# Patient Record
Sex: Female | Born: 1955 | Race: White | Hispanic: No | Marital: Married | Smoking: Never smoker
Health system: Southern US, Community
[De-identification: ages and names within clinical notes are randomized; demographics above are authoritative.]

## PROBLEM LIST (undated history)

## (undated) DIAGNOSIS — E785 Hyperlipidemia, unspecified: Secondary | ICD-10-CM

## (undated) DIAGNOSIS — N951 Menopausal and female climacteric states: Secondary | ICD-10-CM

## (undated) DIAGNOSIS — K219 Gastro-esophageal reflux disease without esophagitis: Secondary | ICD-10-CM

## (undated) DIAGNOSIS — R0600 Dyspnea, unspecified: Secondary | ICD-10-CM

## (undated) DIAGNOSIS — M810 Age-related osteoporosis without current pathological fracture: Secondary | ICD-10-CM

## (undated) DIAGNOSIS — Z9889 Other specified postprocedural states: Secondary | ICD-10-CM

## (undated) DIAGNOSIS — T8859XA Other complications of anesthesia, initial encounter: Secondary | ICD-10-CM

## (undated) DIAGNOSIS — I10 Essential (primary) hypertension: Secondary | ICD-10-CM

## (undated) DIAGNOSIS — H269 Unspecified cataract: Secondary | ICD-10-CM

## (undated) DIAGNOSIS — D860 Sarcoidosis of lung: Secondary | ICD-10-CM

## (undated) DIAGNOSIS — C439 Malignant melanoma of skin, unspecified: Secondary | ICD-10-CM

## (undated) DIAGNOSIS — C801 Malignant (primary) neoplasm, unspecified: Secondary | ICD-10-CM

## (undated) DIAGNOSIS — T4145XA Adverse effect of unspecified anesthetic, initial encounter: Secondary | ICD-10-CM

## (undated) DIAGNOSIS — R112 Nausea with vomiting, unspecified: Secondary | ICD-10-CM

## (undated) DIAGNOSIS — J4540 Moderate persistent asthma, uncomplicated: Secondary | ICD-10-CM

## (undated) HISTORY — DX: Sarcoidosis of lung: D86.0

## (undated) HISTORY — DX: Menopausal and female climacteric states: N95.1

## (undated) HISTORY — DX: Essential (primary) hypertension: I10

## (undated) HISTORY — DX: Unspecified cataract: H26.9

## (undated) HISTORY — PX: BREAST CYST ASPIRATION: SHX578

## (undated) HISTORY — DX: Moderate persistent asthma, uncomplicated: J45.40

## (undated) HISTORY — DX: Hyperlipidemia, unspecified: E78.5

## (undated) HISTORY — DX: Age-related osteoporosis without current pathological fracture: M81.0

---
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
## 1999-08-16 ENCOUNTER — Other Ambulatory Visit: Admission: RE | Admit: 1999-08-16 | Discharge: 1999-08-16 | Payer: Self-pay | Admitting: Obstetrics and Gynecology

## 1999-08-17 ENCOUNTER — Other Ambulatory Visit: Admission: RE | Admit: 1999-08-17 | Discharge: 1999-08-17 | Payer: Self-pay | Admitting: Obstetrics and Gynecology

## 2001-01-25 ENCOUNTER — Other Ambulatory Visit: Admission: RE | Admit: 2001-01-25 | Discharge: 2001-01-25 | Payer: Self-pay | Admitting: Obstetrics and Gynecology

## 2006-07-26 ENCOUNTER — Other Ambulatory Visit: Admission: RE | Admit: 2006-07-26 | Discharge: 2006-07-26 | Payer: Self-pay | Admitting: Internal Medicine

## 2007-03-08 ENCOUNTER — Encounter: Admission: RE | Admit: 2007-03-08 | Discharge: 2007-03-08 | Payer: Self-pay | Admitting: Internal Medicine

## 2007-08-13 ENCOUNTER — Other Ambulatory Visit: Admission: RE | Admit: 2007-08-13 | Discharge: 2007-08-13 | Payer: Self-pay | Admitting: Internal Medicine

## 2008-03-09 ENCOUNTER — Encounter: Admission: RE | Admit: 2008-03-09 | Discharge: 2008-03-09 | Payer: Self-pay | Admitting: Internal Medicine

## 2008-03-31 ENCOUNTER — Ambulatory Visit: Payer: Self-pay | Admitting: Internal Medicine

## 2008-09-10 ENCOUNTER — Ambulatory Visit: Payer: Self-pay | Admitting: Internal Medicine

## 2008-09-10 ENCOUNTER — Other Ambulatory Visit: Admission: RE | Admit: 2008-09-10 | Discharge: 2008-09-10 | Payer: Self-pay | Admitting: Internal Medicine

## 2009-03-10 ENCOUNTER — Encounter: Admission: RE | Admit: 2009-03-10 | Discharge: 2009-03-10 | Payer: Self-pay | Admitting: Internal Medicine

## 2009-03-18 ENCOUNTER — Ambulatory Visit: Payer: Self-pay | Admitting: Internal Medicine

## 2009-09-14 ENCOUNTER — Ambulatory Visit: Payer: Self-pay | Admitting: Internal Medicine

## 2010-03-10 ENCOUNTER — Ambulatory Visit: Payer: Self-pay | Admitting: Internal Medicine

## 2010-03-11 ENCOUNTER — Encounter: Admission: RE | Admit: 2010-03-11 | Discharge: 2010-03-11 | Payer: Self-pay | Admitting: Internal Medicine

## 2010-03-25 ENCOUNTER — Encounter: Admission: RE | Admit: 2010-03-25 | Discharge: 2010-03-25 | Payer: Self-pay | Admitting: Internal Medicine

## 2010-08-15 ENCOUNTER — Other Ambulatory Visit: Payer: Self-pay | Admitting: Internal Medicine

## 2010-08-15 DIAGNOSIS — Z09 Encounter for follow-up examination after completed treatment for conditions other than malignant neoplasm: Secondary | ICD-10-CM

## 2010-08-30 ENCOUNTER — Ambulatory Visit
Admission: RE | Admit: 2010-08-30 | Discharge: 2010-08-30 | Disposition: A | Discharge status: Home / Self Care | Payer: PRIVATE HEALTH INSURANCE | Source: Ambulatory Visit | Attending: Internal Medicine | Admitting: Internal Medicine

## 2010-08-30 ENCOUNTER — Other Ambulatory Visit: Payer: Self-pay | Admitting: Internal Medicine

## 2010-08-30 DIAGNOSIS — Z09 Encounter for follow-up examination after completed treatment for conditions other than malignant neoplasm: Secondary | ICD-10-CM

## 2010-08-30 DIAGNOSIS — N6001 Solitary cyst of right breast: Secondary | ICD-10-CM

## 2010-09-15 ENCOUNTER — Other Ambulatory Visit: Payer: Self-pay | Admitting: Internal Medicine

## 2010-09-22 ENCOUNTER — Encounter: Payer: Self-pay | Admitting: Internal Medicine

## 2010-09-22 ENCOUNTER — Ambulatory Visit (INDEPENDENT_AMBULATORY_CARE_PROVIDER_SITE_OTHER): Payer: PRIVATE HEALTH INSURANCE | Admitting: Internal Medicine

## 2010-09-22 VITALS — BP 126/88 | HR 76 | Temp 98.2°F | Ht 64.0 in | Wt 143.0 lb

## 2010-09-22 DIAGNOSIS — Z0000 Encounter for general adult medical examination without abnormal findings: Secondary | ICD-10-CM

## 2010-09-22 DIAGNOSIS — I10 Essential (primary) hypertension: Secondary | ICD-10-CM | POA: Insufficient documentation

## 2010-09-22 LAB — POCT URINALYSIS DIPSTICK
Bilirubin, UA: NEGATIVE
Leukocytes, UA: NEGATIVE
Nitrite, UA: NEGATIVE
Protein, UA: NEGATIVE
pH, UA: 7

## 2010-09-22 LAB — CBC WITH DIFFERENTIAL/PLATELET
Basophils Absolute: 0 10*3/uL (ref 0.0–0.1)
Basophils Relative: 0 % (ref 0–1)
MCHC: 33.5 g/dL (ref 30.0–36.0)
Neutro Abs: 2.7 10*3/uL (ref 1.7–7.7)
Neutrophils Relative %: 66 % (ref 43–77)
RDW: 12.6 % (ref 11.5–15.5)
WBC: 4.1 10*3/uL (ref 4.0–10.5)

## 2010-09-22 LAB — COMPREHENSIVE METABOLIC PANEL
ALT: 13 U/L (ref 0–35)
AST: 22 U/L (ref 0–37)
Albumin: 4.6 g/dL (ref 3.5–5.2)
Alkaline Phosphatase: 55 U/L (ref 39–117)
Potassium: 3.8 mEq/L (ref 3.5–5.3)
Sodium: 141 mEq/L (ref 135–145)
Total Protein: 6.7 g/dL (ref 6.0–8.3)

## 2010-09-22 LAB — LIPID PANEL
HDL: 75 mg/dL (ref >39)
LDL Cholesterol: 129 mg/dL — ABNORMAL HIGH (ref 0–99)

## 2010-09-22 LAB — TSH: TSH: 0.664 u[IU]/mL (ref 0.350–4.500)

## 2010-09-22 LAB — VITAMIN D 25 HYDROXY (VIT D DEFICIENCY, FRACTURES): Vit D, 25-Hydroxy: 40 ng/mL (ref 30–89)

## 2010-09-22 NOTE — Patient Instructions (Signed)
Continue same meds     Return in 6 months

## 2010-09-22 NOTE — Progress Notes (Signed)
  Subjective:    Patient ID: Tamara Myers, female    DOB: 08-Mar-1956, 55 y.o.   MRN: [id number]  HPI 55 year old white female for physical examination and evaluation of hypertension controlled. No complaints or problems except for recent URI symptoms onset yesterday. No fever or chills. No productive sputum or sore throat.    Review of Systems  Constitutional: Negative for fever, activity change, appetite change, fatigue and unexpected weight change.  HENT: Positive for congestion.   Eyes: Negative.   Respiratory: Negative.   Cardiovascular: Negative.   Gastrointestinal: Negative.   Genitourinary: Negative.   Musculoskeletal: Negative.   Neurological: Negative.   Hematological: Negative.   Psychiatric/Behavioral: Negative.        Objective:   Physical Exam  Constitutional: She is oriented to person, place, and time. She appears well-developed and well-nourished.  HENT:  Head: Normocephalic and atraumatic.  Right Ear: External ear normal.  Left Ear: External ear normal.  Nose: Nose normal.  Mouth/Throat: No oropharyngeal exudate.  Eyes: Conjunctivae and EOM are normal. Pupils are equal, round, and reactive to light. Right eye exhibits no discharge. Left eye exhibits no discharge. No scleral icterus.  Neck: Normal range of motion. Neck supple. No JVD present. No thyromegaly present.  Cardiovascular: Normal rate, regular rhythm, normal heart sounds and intact distal pulses.   No murmur heard. Pulmonary/Chest: Effort normal and breath sounds normal. No respiratory distress. She has no wheezes. She has no rales.  Abdominal: Soft. Bowel sounds are normal. She exhibits no mass. There is no tenderness. There is no rebound.  Musculoskeletal: Normal range of motion. She exhibits no edema.  Lymphadenopathy:    She has no cervical adenopathy.  Neurological: She is alert and oriented to person, place, and time. She has normal reflexes. She displays normal reflexes. No cranial nerve  deficit.  Skin: Skin is warm and dry. No rash noted.          Assessment & Plan:    New onset upper respiratory infection. Symptomatic treatment only. A prescription given.  Hypertension well controlled on Norvasc 5 mg daily and Maxzide 25 one tablet daily.  Return in 6 months for office visit and blood pressure check.

## 2010-09-23 ENCOUNTER — Encounter: Payer: Self-pay | Admitting: Internal Medicine

## 2010-12-01 ENCOUNTER — Other Ambulatory Visit: Payer: Self-pay | Admitting: Internal Medicine

## 2011-01-24 ENCOUNTER — Other Ambulatory Visit: Payer: Self-pay | Admitting: Internal Medicine

## 2011-01-24 DIAGNOSIS — Z1231 Encounter for screening mammogram for malignant neoplasm of breast: Secondary | ICD-10-CM

## 2011-03-30 ENCOUNTER — Ambulatory Visit (INDEPENDENT_AMBULATORY_CARE_PROVIDER_SITE_OTHER): Payer: PRIVATE HEALTH INSURANCE | Admitting: Internal Medicine

## 2011-03-30 ENCOUNTER — Encounter: Payer: Self-pay | Admitting: Internal Medicine

## 2011-03-30 VITALS — BP 126/84 | HR 76 | Temp 98.2°F | Wt 144.0 lb

## 2011-03-30 DIAGNOSIS — J069 Acute upper respiratory infection, unspecified: Secondary | ICD-10-CM

## 2011-03-30 DIAGNOSIS — I10 Essential (primary) hypertension: Secondary | ICD-10-CM

## 2011-03-30 NOTE — Progress Notes (Signed)
  Subjective:    Patient ID: Tamara Myers, female    DOB: 1955-07-11, 55 y.o.   MRN: [id number]  HPI 55 year old white female in today for six-month recheck on hypertension. Blood pressure under good control with Maxzide 25 and amlodipine 5 mg daily. She did receive an influenza immunization through her employment. Also has developed URI the past few days. No fever or chills or myalgias. Has had nasal congestion and some cough at night.    Review of Systems     Objective:   Physical Exam HEENT exam: TMs and pharynx are clear; neck is supple without adenopathy; chest clear; cardiac exam regular rate and rhythm; extremities without edema        Assessment & Plan:  Hypertension-well-controlled on current regimen  URI  Plan: Continue same medications for hypertension. Return in 6 months for physical exam and fasting lab work. For cough Tessalon Perles 100 mg (#60) 2 by mouth 3 times a day when necessary cough.

## 2011-03-30 NOTE — Patient Instructions (Signed)
Continue same medications for hypertension control. Return in 6 months for physical examination. For cough May take Tessalon Perles 2 by mouth 3 times daily

## 2011-04-05 ENCOUNTER — Ambulatory Visit
Admission: RE | Admit: 2011-04-05 | Discharge: 2011-04-05 | Disposition: A | Discharge status: Home / Self Care | Payer: PRIVATE HEALTH INSURANCE | Source: Ambulatory Visit | Attending: Internal Medicine | Admitting: Internal Medicine

## 2011-04-05 DIAGNOSIS — Z1231 Encounter for screening mammogram for malignant neoplasm of breast: Secondary | ICD-10-CM

## 2011-07-29 ENCOUNTER — Ambulatory Visit (INDEPENDENT_AMBULATORY_CARE_PROVIDER_SITE_OTHER): Payer: PRIVATE HEALTH INSURANCE | Admitting: Physician Assistant

## 2011-07-29 VITALS — BP 105/74 | HR 97 | Temp 101.2°F | Resp 18 | Ht 64.25 in | Wt 141.0 lb

## 2011-07-29 DIAGNOSIS — J40 Bronchitis, not specified as acute or chronic: Secondary | ICD-10-CM

## 2011-07-29 DIAGNOSIS — R05 Cough: Secondary | ICD-10-CM

## 2011-07-29 DIAGNOSIS — R509 Fever, unspecified: Secondary | ICD-10-CM

## 2011-07-29 LAB — POCT CBC
HCT, POC: 39.6 % (ref 37.7–47.9)
Hemoglobin: 13.1 g/dL (ref 12.2–16.2)
Lymph, poc: 1.5 (ref 0.6–3.4)
MCHC: 33.1 g/dL (ref 31.8–35.4)
POC Granulocyte: 6.8 (ref 2–6.9)

## 2011-07-29 LAB — POCT INFLUENZA A/B
Influenza A, POC: NEGATIVE
Influenza B, POC: NEGATIVE

## 2011-07-29 MED ORDER — AZITHROMYCIN 500 MG PO TABS: 500.0000 mg | ORAL_TABLET | Freq: Every day | ORAL | Status: AC

## 2011-07-29 MED ORDER — HYDROCODONE-HOMATROPINE 5-1.5 MG/5ML PO SYRP: ORAL_SOLUTION | ORAL | Status: AC

## 2011-07-29 NOTE — Progress Notes (Signed)
Patient ID: Tamara Myers MRN: [id number], DOB: 1955/07/02, 56 y.o. Date of Encounter: 07/29/2011, 11:38 AM  Primary Physician: No primary provider on file.  Chief Complaint:  Chief Complaint  Patient presents with  . Cough    x 1 week, productive-greenish yellow  causes chest pain  . URI  . Sinusitis    HPI: 56 y.o. year old female presents with a 7 day history of nasal congestion, post nasal drip, sinus pressure, and cough. Tmax currently. Mild chills. Nasal congestion thick and green/yellow. Cough is productive of green/yellow sputum and worse at night time. Will get into cough episodes that cause soreness. No chest pain, SOB, or wheezing. Ears feel full, leading to sensation of muffled hearing. Has tried OTC cold preps without success. No GI complaints. Appetite normal. Husband sick with same symptoms. She did receive an influenza vaccine this year.  No recent antibiotics or recent travels.   No leg trauma, sedentary periods, h/o cancer, or tobacco use.  Past Medical History  Diagnosis Date  . Hypertension   . Post menopausal syndrome      Home Meds: Prior to Admission medications   Medication Sig Start Date End Date Taking? Authorizing Provider  amLODipine (NORVASC) 5 MG tablet TAKE 1 TABLET BY MOUTH EVERY DAY 09/15/10  Yes Margaree Mackintosh, MD  triamterene-hydrochlorothiazide (MAXZIDE-25) 37.5-25 MG per tablet TAKE 1 TABLET BY MOUTH EVERY DAY 12/01/10  Yes Margaree Mackintosh, MD  glucosamine-chondroitin 500-400 MG tablet Take 1 tablet by mouth 2 (two) times daily at 10 AM and 5 PM.      Historical Provider, MD  vitamin E 400 UNIT capsule Take 400 Units by mouth 2 (two) times daily.      Historical Provider, MD    Allergies: No Known Allergies  History   Social History  . Marital Status: Married    Spouse Name: N/A    Number of Children: N/A  . Years of Education: N/A   Occupational History  . Not on file.   Social History Main Topics  . Smoking status: Never Smoker     . Smokeless tobacco: Never Used  . Alcohol Use: No  . Drug Use: No  . Sexually Active: Not on file   Other Topics Concern  . Not on file   Social History Narrative  . No narrative on file     Review of Systems: Constitutional: negative for chills, fever, night sweats or weight changes Cardiovascular: negative for chest pain or palpitations Respiratory: negative for hemoptysis, wheezing, or shortness of breath Abdominal: negative for abdominal pain, nausea, vomiting or diarrhea Dermatological: negative for rash Neurologic: negative for headache   Physical Exam: Blood pressure 105/74, pulse 97, temperature 101.2 F (38.4 C), temperature source Oral, resp. rate 18, height 5' 4.25" (1.632 m), weight 141 lb (63.957 kg), SpO2 95.00%., Body mass index is 24.01 kg/(m^2). General: Well developed, well nourished, in no acute distress. Head: Normocephalic, atraumatic, eyes without discharge, sclera non-icteric, nares are congested. Bilateral auditory canals clear, TM's are without perforation, pearly grey with reflective cone of light bilaterally. Serous effusion bilaterally behind TM's. Maxillary sinus TTP. Oral cavity moist, dentition normal. Posterior pharynx with post nasal drip and mild erythema. No peritonsillar abscess or tonsillar exudate. Neck: Supple. No thyromegaly. Full ROM. No lymphadenopathy. Lungs: Coarse breath sounds bilaterally without wheezes, rales, or rhonchi. Breathing is unlabored.  Heart: RRR with S1 S2. No murmurs, rubs, or gallops appreciated. Msk:  Strength and tone normal for age. Extremities: No  clubbing or cyanosis. No edema. Neuro: Alert and oriented X 3. Moves all extremities spontaneously. CNII-XII grossly in tact. Psych:  Responds to questions appropriately with a normal affect.   Labs: Results for orders placed in visit on 07/29/11  POCT CBC      Component Value Range   WBC 8.7  4.6 - 10.2 (K/uL)   Lymph, poc 1.5  0.6 - 3.4    POC LYMPH PERCENT 17.0   10 - 50 (%L)   MID (cbc) 0.4  0 - 0.9    POC MID % 4.9  0 - 12 (%M)   POC Granulocyte 6.8  2 - 6.9    Granulocyte percent 78.1  37 - 80 (%G)   RBC 4.50  4.04 - 5.48 (M/uL)   Hemoglobin 13.1  12.2 - 16.2 (g/dL)   HCT, POC 16.1  09.6 - 47.9 (%)   MCV 88.1  80 - 97 (fL)   MCH, POC 29.1  27 - 31.2 (pg)   MCHC 33.1  31.8 - 35.4 (g/dL)   RDW, POC 04.5     Platelet Count, POC 251  142 - 424 (K/uL)   MPV 8.2  0 - 99.8 (fL)  POCT INFLUENZA A/B      Component Value Range   Influenza A, POC Negative     Influenza B, POC Negative       ASSESSMENT AND PLAN:  56 y.o. year old female with bronchitis -Azithromycin 500 mg 1 po daily #5 no RF -Hycodan #4oz 1 tsp po q 4-6 hours prn cough no RF SED -Mucinex -Tylenol/Motrin prn -Rest/fluids -RTC precautions -RTC 3-5 days if no improvement  Signed, Eula Listen, PA-C 07/29/2011 11:38 AM

## 2011-08-27 ENCOUNTER — Other Ambulatory Visit: Payer: Self-pay | Admitting: Internal Medicine

## 2011-09-29 ENCOUNTER — Other Ambulatory Visit (HOSPITAL_COMMUNITY)
Admission: RE | Admit: 2011-09-29 | Discharge: 2011-09-29 | Disposition: A | Discharge status: Home / Self Care | Payer: PRIVATE HEALTH INSURANCE | Source: Ambulatory Visit | Attending: Internal Medicine | Admitting: Internal Medicine

## 2011-09-29 ENCOUNTER — Encounter: Payer: Self-pay | Admitting: Internal Medicine

## 2011-09-29 ENCOUNTER — Ambulatory Visit (INDEPENDENT_AMBULATORY_CARE_PROVIDER_SITE_OTHER): Payer: PRIVATE HEALTH INSURANCE | Admitting: Internal Medicine

## 2011-09-29 VITALS — BP 122/78 | HR 76 | Temp 98.9°F | Ht 64.0 in | Wt 140.0 lb

## 2011-09-29 DIAGNOSIS — Z124 Encounter for screening for malignant neoplasm of cervix: Secondary | ICD-10-CM

## 2011-09-29 DIAGNOSIS — Z01419 Encounter for gynecological examination (general) (routine) without abnormal findings: Secondary | ICD-10-CM | POA: Insufficient documentation

## 2011-09-29 DIAGNOSIS — Z0000 Encounter for general adult medical examination without abnormal findings: Secondary | ICD-10-CM

## 2011-09-29 DIAGNOSIS — I10 Essential (primary) hypertension: Secondary | ICD-10-CM

## 2011-09-29 DIAGNOSIS — N6019 Diffuse cystic mastopathy of unspecified breast: Secondary | ICD-10-CM

## 2011-09-29 LAB — POCT URINALYSIS DIPSTICK
Glucose, UA: NEGATIVE
Nitrite, UA: NEGATIVE
Spec Grav, UA: 1.01
Urobilinogen, UA: NEGATIVE

## 2011-09-30 LAB — LIPID PANEL
Cholesterol: 233 mg/dL — ABNORMAL HIGH (ref 0–200)
VLDL: 15 mg/dL (ref 0–40)

## 2011-09-30 LAB — CBC WITH DIFFERENTIAL/PLATELET
Lymphocytes Relative: 32 % (ref 12–46)
Lymphs Abs: 0.8 10*3/uL (ref 0.7–4.0)
Neutro Abs: 1.5 10*3/uL — ABNORMAL LOW (ref 1.7–7.7)
Neutrophils Relative %: 57 % (ref 43–77)
Platelets: 279 10*3/uL (ref 150–400)
RBC: 4.46 MIL/uL (ref 3.87–5.11)
WBC: 2.6 10*3/uL — ABNORMAL LOW (ref 4.0–10.5)

## 2011-09-30 LAB — COMPREHENSIVE METABOLIC PANEL
ALT: 12 U/L (ref 0–35)
CO2: 27 mEq/L (ref 19–32)
Calcium: 9.7 mg/dL (ref 8.4–10.5)
Chloride: 102 mEq/L (ref 96–112)
Sodium: 139 mEq/L (ref 135–145)
Total Protein: 6.7 g/dL (ref 6.0–8.3)

## 2011-09-30 LAB — TSH: TSH: 0.825 u[IU]/mL (ref 0.350–4.500)

## 2011-09-30 LAB — VITAMIN D 25 HYDROXY (VIT D DEFICIENCY, FRACTURES): Vit D, 25-Hydroxy: 41 ng/mL (ref 30–89)

## 2011-10-29 ENCOUNTER — Encounter: Payer: Self-pay | Admitting: Internal Medicine

## 2011-10-29 DIAGNOSIS — N6019 Diffuse cystic mastopathy of unspecified breast: Secondary | ICD-10-CM | POA: Insufficient documentation

## 2011-10-29 NOTE — Patient Instructions (Addendum)
Continue same antihypertensive medications. Return in 6 months. Consider colonoscopy.

## 2011-10-29 NOTE — Progress Notes (Signed)
Subjective:    Patient ID: Tamara Myers, female    DOB: 1956/02/15, 56 y.o.   MRN: [id number]  HPI 56 year old white female in today for physical examination in today wishing medical problems. History of hypertension well controlled on Norvasc 5 mg daily and Maxzide 25 daily.  No known drug allergies  Every past medical history: 2 C-sections in the past. Fractured right fifth metacarpal and HTN. Fractured left fifth finger at age 72.  Social history: Married with 2 adult children. Works for [employer] Family history: Father with history of CABG, diabetes mellitus, BOOP syndrome, hypertension, hyperlipidemia. Mother with history of Mnire's disease and history of melanoma.  Patient became menopausal in 2004. Had mammogram may 2012. Tetanus immunization given November 2010, influenza immunization October 2012.  Does not smoke or consume alcohol.  History of right knee pain seen by orthopedist 2011 at which time she received an injection of Xylocaine and Aristospan into the right knee.  History of fibrocystic disease and had aspiration of right breast cyst may 2012.    Review of Systems  Constitutional: Negative.   HENT: Negative.   Eyes: Negative.   Respiratory: Negative.   Cardiovascular: Negative.   Gastrointestinal: Negative.   Genitourinary: Negative.   Musculoskeletal: Negative.   Neurological: Negative.   Hematological: Negative.   Psychiatric/Behavioral: Negative.        Objective:   Physical Exam  Nursing note and vitals reviewed. Constitutional: She is oriented to person, place, and time. She appears well-developed and well-nourished. No distress.  HENT:  Head: Normocephalic and atraumatic.  Right Ear: External ear normal.  Left Ear: External ear normal.  Mouth/Throat: Oropharynx is clear and moist. No oropharyngeal exudate.  Eyes: Conjunctivae and EOM are normal. Pupils are equal, round, and reactive to light. Right eye exhibits no discharge. Left eye  exhibits no discharge. No scleral icterus.  Neck: Normal range of motion. Neck supple. No JVD present. No thyromegaly present.  Cardiovascular: Normal rate, regular rhythm, normal heart sounds and intact distal pulses.   No murmur heard. Pulmonary/Chest: Effort normal and breath sounds normal. She has no wheezes. She exhibits no tenderness.       Breasts normal female  Abdominal: Soft. Bowel sounds are normal. She exhibits no distension and no mass. There is no tenderness. There is no rebound and no guarding.  Genitourinary: Vagina normal and uterus normal. No vaginal discharge found.  Musculoskeletal: Normal range of motion. She exhibits no edema.  Lymphadenopathy:    She has no cervical adenopathy.  Neurological: She is alert and oriented to person, place, and time. She has normal reflexes. No cranial nerve deficit. Coordination normal.  Skin: Skin is warm and dry. No rash noted. She is not diaphoretic.  Psychiatric: She has a normal mood and affect. Her behavior is normal. Judgment and thought content normal.          Assessment & Plan:  Hypertension  Fibrocystic breast disease  Unexplained leukopenia-new onset  Plan: Recommend colonoscopy. In lieu of that given 3 Hemoccult cards. Needs annual mammogram. Return in 6 months.  Lab work shows white blood cell count low at 2600. Not sure why this is the case but can repeat in 6 months when she returns. Previously 2 months ago white blood cell count was 8700 and 1 year ago 4100. Hemoglobin and platelet count are normal. Total cholesterol was 233 and previously was 217 one year ago. Triglycerides are normal. She has a high HDL cholesterol of 83. LDL cholesterol is  high at 135. TSH is normal. Vitamin D is normal. Patient may need to consider statin therapy. Repeat lipid panel when she returns in 6 months.

## 2012-02-23 ENCOUNTER — Other Ambulatory Visit: Payer: Self-pay

## 2012-02-26 ENCOUNTER — Other Ambulatory Visit: Payer: Self-pay | Admitting: Internal Medicine

## 2012-02-27 ENCOUNTER — Other Ambulatory Visit: Payer: Self-pay | Admitting: Internal Medicine

## 2012-02-27 DIAGNOSIS — Z1231 Encounter for screening mammogram for malignant neoplasm of breast: Secondary | ICD-10-CM

## 2012-03-25 ENCOUNTER — Ambulatory Visit (INDEPENDENT_AMBULATORY_CARE_PROVIDER_SITE_OTHER): Payer: PRIVATE HEALTH INSURANCE | Admitting: Internal Medicine

## 2012-03-25 ENCOUNTER — Encounter: Payer: Self-pay | Admitting: Internal Medicine

## 2012-03-25 VITALS — BP 136/84 | HR 76 | Temp 98.2°F | Wt 148.0 lb

## 2012-03-25 DIAGNOSIS — H6590 Unspecified nonsuppurative otitis media, unspecified ear: Secondary | ICD-10-CM

## 2012-03-25 DIAGNOSIS — I10 Essential (primary) hypertension: Secondary | ICD-10-CM

## 2012-03-25 DIAGNOSIS — H6591 Unspecified nonsuppurative otitis media, right ear: Secondary | ICD-10-CM

## 2012-03-26 NOTE — Patient Instructions (Addendum)
Take Zithromax Z-PAK as to reck did for ear infection. Continue amlodipine and Maxide 25 for hypertension. Return in 6 months for physical exam.

## 2012-03-26 NOTE — Progress Notes (Signed)
  Subjective:    Patient ID: Tamara Myers, female    DOB: June 11, 1955, 56 y.o.   MRN: [id number]  HPI and white female in today for hypertension six-month recheck. Has had recent problem with right ear feeling full with some postnasal drip. Blood pressure is stable on amlodipine and Maxide 25. No fever or chills with year complaints. No sore throat. No cough.    Review of Systems     Objective:   Physical Exam Right TM is full but not red. Left TM is clear. Neck is supple without thyromegaly JVD or carotid bruits. Chest: clear to auscultation. Cardiac: exam regular rate and rhythm normal S1 and S2. Extremities: without edema. Skin is warm and dry.        Assessment & Plan:  Right serous otitis media  Hypertension  Plan: Continue same antihypertensive medications and return in 6 months for physical exam. Zithromax Z-PAK take 2 tablets day one followed by 1 tablet days 2 through 5 for serous otitis media.

## 2012-04-05 ENCOUNTER — Ambulatory Visit
Admission: RE | Admit: 2012-04-05 | Discharge: 2012-04-05 | Disposition: A | Discharge status: Home / Self Care | Payer: PRIVATE HEALTH INSURANCE | Source: Ambulatory Visit | Attending: Internal Medicine | Admitting: Internal Medicine

## 2012-04-05 DIAGNOSIS — Z1231 Encounter for screening mammogram for malignant neoplasm of breast: Secondary | ICD-10-CM

## 2012-04-11 ENCOUNTER — Telehealth: Payer: Self-pay | Admitting: Internal Medicine

## 2012-04-11 DIAGNOSIS — J069 Acute upper respiratory infection, unspecified: Secondary | ICD-10-CM

## 2012-04-11 MED ORDER — AZITHROMYCIN 250 MG PO TABS: ORAL_TABLET | ORAL | Status: DC

## 2012-04-11 NOTE — Telephone Encounter (Signed)
Call in Zithromax Z pak with no refill   2 po day 1 then one po days 2-5

## 2012-04-11 NOTE — Telephone Encounter (Signed)
[place] complains of fluid in right ear and would like to know if she could have an antibiotic for it. Zpak worked before she went to [employer].

## 2012-05-01 HISTORY — PX: OTHER SURGICAL HISTORY: SHX169

## 2012-08-26 ENCOUNTER — Other Ambulatory Visit: Payer: Self-pay | Admitting: Internal Medicine

## 2012-10-03 ENCOUNTER — Encounter: Payer: Self-pay | Admitting: Internal Medicine

## 2012-10-03 ENCOUNTER — Ambulatory Visit (INDEPENDENT_AMBULATORY_CARE_PROVIDER_SITE_OTHER): Payer: PRIVATE HEALTH INSURANCE | Admitting: Internal Medicine

## 2012-10-03 VITALS — BP 118/86 | HR 72 | Temp 97.9°F | Ht 64.0 in | Wt 145.0 lb

## 2012-10-03 DIAGNOSIS — Z13228 Encounter for screening for other metabolic disorders: Secondary | ICD-10-CM

## 2012-10-03 DIAGNOSIS — Z0000 Encounter for general adult medical examination without abnormal findings: Secondary | ICD-10-CM

## 2012-10-03 DIAGNOSIS — Z130 Encounter for screening for diseases of the blood and blood-forming organs and certain disorders involving the immune mechanism: Secondary | ICD-10-CM

## 2012-10-03 DIAGNOSIS — I10 Essential (primary) hypertension: Secondary | ICD-10-CM

## 2012-10-03 DIAGNOSIS — Z1329 Encounter for screening for other suspected endocrine disorder: Secondary | ICD-10-CM

## 2012-10-03 LAB — COMPREHENSIVE METABOLIC PANEL
ALT: 11 U/L (ref 0–35)
AST: 17 U/L (ref 0–37)
Alkaline Phosphatase: 58 U/L (ref 39–117)
BUN: 12 mg/dL (ref 6–23)
Creat: 0.78 mg/dL (ref 0.50–1.10)
Potassium: 3.6 mEq/L (ref 3.5–5.3)

## 2012-10-03 LAB — LIPID PANEL
HDL: 75 mg/dL (ref >39)
LDL Cholesterol: 152 mg/dL — ABNORMAL HIGH (ref 0–99)
Total CHOL/HDL Ratio: 3.2 Ratio
VLDL: 14 mg/dL (ref 0–40)

## 2012-10-03 LAB — CBC WITH DIFFERENTIAL/PLATELET
Basophils Absolute: 0 10*3/uL (ref 0.0–0.1)
Basophils Relative: 0 % (ref 0–1)
Eosinophils Relative: 2 % (ref 0–5)
HCT: 40.9 % (ref 36.0–46.0)
Lymphocytes Relative: 30 % (ref 12–46)
MCHC: 34.7 g/dL (ref 30.0–36.0)
Monocytes Absolute: 0.3 10*3/uL (ref 0.1–1.0)
Neutro Abs: 1.4 10*3/uL — ABNORMAL LOW (ref 1.7–7.7)
Platelets: 235 10*3/uL (ref 150–400)
RDW: 13.5 % (ref 11.5–15.5)
WBC: 2.5 10*3/uL — ABNORMAL LOW (ref 4.0–10.5)

## 2012-10-03 LAB — TSH: TSH: 0.566 u[IU]/mL (ref 0.350–4.500)

## 2012-10-03 NOTE — Patient Instructions (Addendum)
Continue same meds and return in 6 months 

## 2012-10-03 NOTE — Addendum Note (Signed)
Addended by: Judy Pimple on: 10/03/2012 12:24 PM   Modules accepted: Orders

## 2012-10-03 NOTE — Progress Notes (Signed)
  Subjective:    Patient ID: Tamara Myers, female    DOB: 1956/04/20, 57 y.o.   MRN: [id number]  HPI 57 year old white female in today for health maintenance and evaluation of medical problems. Has had several skin biopsies over the past year. Apparently had dysplastic nevus right leg treated with Dr. Irene Limbo, had atypical nevus removed from left arm and a basal cell carcinoma removed as well. History of sun damage in the remote past. No longer gets in the sun. She is fair skinned. History of hypertension. Blood pressure well controlled on Norvasc and Maxzide. History of fibrocystic breast disease.  No known drug allergies.  Past medical history: 2 C-sections and the past. Fractured right fifth metacarpal. Fractured left fifth finger at age 54.  Social history: Married with 2 adult children. Works for the [employer]  Family history: Father with history of CABG, diabetes mellitus, BOOP syndrome, hypertension, hyperlipidemia. Mother with history of Mnire's disease and history of melanoma.  Patient became menopausal in 2000. Had mammogram recently. Tetanus immunization November 2010.  Does not smoke or consume alcohol.  History of right knee pain seen by orthopedist 2011 at which time she received an injection of Xylocaine and Aristospan.  History of fibrocystic breast disease. Had aspiration of right breast cyst in May 2012    Review of Systems  Constitutional: Negative.   All other systems reviewed and are negative.       Objective:   Physical Exam  Vitals reviewed. Constitutional: She is oriented to person, place, and time. She appears well-developed and well-nourished. No distress.  HENT:  Head: Normocephalic and atraumatic.  Right Ear: External ear normal.  Left Ear: External ear normal.  Mouth/Throat: Oropharynx is clear and moist. No oropharyngeal exudate.  Eyes: Conjunctivae and EOM are normal. Pupils are equal, round, and reactive to light. Right eye exhibits  no discharge. Left eye exhibits no discharge. No scleral icterus.  Neck: Neck supple. No JVD present. No thyromegaly present.  Cardiovascular: Normal rate, regular rhythm, normal heart sounds and intact distal pulses.   No murmur heard. Pulmonary/Chest: Effort normal and breath sounds normal.  Breasts normal female  Abdominal: Soft. Bowel sounds are normal. She exhibits no distension and no mass. There is no tenderness. There is no rebound and no guarding.  Genitourinary:  Bimanual normal  Musculoskeletal: She exhibits no edema.  Lymphadenopathy:    She has no cervical adenopathy.  Neurological: She is alert and oriented to person, place, and time. She has normal reflexes. No cranial nerve deficit. Coordination normal.  Skin: Skin is warm and dry. No rash noted. She is not diaphoretic.  Psychiatric: She has a normal mood and affect. Her behavior is normal. Judgment and thought content normal.          Assessment & Plan:  Hypertension-well-controlled on current regimen  History of fibrocystic breast disease  History of basal cell carcinoma  History of dysplastic nevus/atypical melanocytic lesion  Plan: Request skin biopsy results from  [facility]  Dermatology. Return in 6 months for office visit blood pressure check. Continue same medications Norvasc and Maxzide. Okay to refill if pharmacy calls. Fasting labs drawn today and are pending.  Colonoscopy has been declined

## 2012-10-04 LAB — VITAMIN D 25 HYDROXY (VIT D DEFICIENCY, FRACTURES): Vit D, 25-Hydroxy: 34 ng/mL (ref 30–89)

## 2012-12-26 ENCOUNTER — Other Ambulatory Visit: Payer: Self-pay

## 2013-03-04 ENCOUNTER — Ambulatory Visit (INDEPENDENT_AMBULATORY_CARE_PROVIDER_SITE_OTHER): Payer: BC Managed Care – PPO | Admitting: Internal Medicine

## 2013-03-04 ENCOUNTER — Encounter: Payer: Self-pay | Admitting: Internal Medicine

## 2013-03-04 VITALS — BP 118/68 | HR 72 | Temp 98.5°F | Ht 64.0 in | Wt 138.0 lb

## 2013-03-04 DIAGNOSIS — H109 Unspecified conjunctivitis: Secondary | ICD-10-CM

## 2013-03-04 DIAGNOSIS — I10 Essential (primary) hypertension: Secondary | ICD-10-CM

## 2013-03-04 DIAGNOSIS — L0390 Cellulitis, unspecified: Secondary | ICD-10-CM

## 2013-03-04 DIAGNOSIS — R21 Rash and other nonspecific skin eruption: Secondary | ICD-10-CM

## 2013-03-04 DIAGNOSIS — L259 Unspecified contact dermatitis, unspecified cause: Secondary | ICD-10-CM

## 2013-03-04 DIAGNOSIS — L0291 Cutaneous abscess, unspecified: Secondary | ICD-10-CM

## 2013-03-04 MED ORDER — METHYLPREDNISOLONE ACETATE 80 MG/ML IJ SUSP
80.0000 mg | Freq: Once | INTRAMUSCULAR | Status: AC
  Administered 2013-03-04: 80 mg via INTRAMUSCULAR

## 2013-03-04 MED ORDER — OFLOXACIN 0.3 % OP SOLN: 1.0000 [drp] | Freq: Four times a day (QID) | OPHTHALMIC | Status: DC

## 2013-03-04 MED ORDER — CEPHALEXIN 500 MG PO CAPS: 500.0000 mg | ORAL_CAPSULE | Freq: Four times a day (QID) | ORAL | Status: DC

## 2013-03-04 NOTE — Patient Instructions (Signed)
You have been given injection of Depo-Medrol for inflammation. Take Keflex 500 mg 4 times daily for 7 days. Use of Floxin ophthalmic drops and right 4 times daily for 5-7 days. Cancel blood pressure check for December. See again in June 2015 for physical exam.

## 2013-03-12 ENCOUNTER — Other Ambulatory Visit: Payer: Self-pay

## 2013-03-12 DIAGNOSIS — Z1231 Encounter for screening mammogram for malignant neoplasm of breast: Secondary | ICD-10-CM

## 2013-03-16 ENCOUNTER — Encounter: Payer: Self-pay | Admitting: Internal Medicine

## 2013-03-16 NOTE — Progress Notes (Signed)
  Subjective:    Patient ID: Tamara Myers, female    DOB: 1956-02-15, 57 y.o.   MRN: [id number]  HPI  58 year old female in today regarding rash on right face. Doesn't recall any insect bite or injury. No new cosmetic products. No new medications. Rash is itchy. Just awakened with it a couple of days ago. She has fair skin and redheaded.  Seeing Dr. Orlan Leavens regarding injury right thumb. Recently diagnosed with right carpal tunnel syndrome. History of hypertension.    Review of Systems     Objective:   Physical Exam Patient has erythema right face with conjunctivitis       Assessment & Plan:  Contact dermatitis  Right conjunctivitis  Cellulitis right face  Hypertension  Plan: Patient will cancel blood pressure check previously scheduled for December so she is here today. Will be due for physical exam June 2015. Given Depo-Medrol 80 mg IM today. Take Keflex 500 mg 4 times daily for 7 days. Use ofloxacin ophthalmic drops in right eye 4 times a day for 5-7 days

## 2013-04-07 ENCOUNTER — Ambulatory Visit: Payer: PRIVATE HEALTH INSURANCE | Admitting: Internal Medicine

## 2013-04-11 ENCOUNTER — Ambulatory Visit
Admission: RE | Admit: 2013-04-11 | Discharge: 2013-04-11 | Disposition: A | Discharge status: Home / Self Care | Payer: BC Managed Care – PPO | Source: Ambulatory Visit

## 2013-04-11 DIAGNOSIS — Z1231 Encounter for screening mammogram for malignant neoplasm of breast: Secondary | ICD-10-CM

## 2013-05-19 ENCOUNTER — Other Ambulatory Visit: Payer: Self-pay | Admitting: Internal Medicine

## 2013-08-18 ENCOUNTER — Other Ambulatory Visit: Payer: Self-pay | Admitting: Internal Medicine

## 2013-09-18 ENCOUNTER — Encounter: Payer: Self-pay | Admitting: Internal Medicine

## 2013-09-18 ENCOUNTER — Ambulatory Visit (INDEPENDENT_AMBULATORY_CARE_PROVIDER_SITE_OTHER): Payer: BC Managed Care – PPO | Admitting: Internal Medicine

## 2013-09-18 VITALS — BP 122/78 | HR 64 | Temp 97.8°F | Ht 64.0 in | Wt 136.0 lb

## 2013-09-18 DIAGNOSIS — Z130 Encounter for screening for diseases of the blood and blood-forming organs and certain disorders involving the immune mechanism: Secondary | ICD-10-CM

## 2013-09-18 DIAGNOSIS — R8290 Unspecified abnormal findings in urine: Secondary | ICD-10-CM

## 2013-09-18 DIAGNOSIS — R82998 Other abnormal findings in urine: Secondary | ICD-10-CM

## 2013-09-18 DIAGNOSIS — Z1329 Encounter for screening for other suspected endocrine disorder: Secondary | ICD-10-CM

## 2013-09-18 DIAGNOSIS — Z0000 Encounter for general adult medical examination without abnormal findings: Secondary | ICD-10-CM

## 2013-09-18 DIAGNOSIS — Z13228 Encounter for screening for other metabolic disorders: Secondary | ICD-10-CM

## 2013-09-18 DIAGNOSIS — I10 Essential (primary) hypertension: Secondary | ICD-10-CM

## 2013-09-18 DIAGNOSIS — Z13220 Encounter for screening for lipoid disorders: Secondary | ICD-10-CM

## 2013-09-18 LAB — POCT URINALYSIS DIPSTICK
Bilirubin, UA: NEGATIVE
Blood, UA: NEGATIVE
Glucose, UA: NEGATIVE
Ketones, UA: NEGATIVE
Nitrite, UA: POSITIVE
PROTEIN UA: NEGATIVE
Spec Grav, UA: 1.015
Urobilinogen, UA: NEGATIVE
pH, UA: 7.5

## 2013-09-18 LAB — CBC WITH DIFFERENTIAL/PLATELET
Basophils Absolute: 0 10*3/uL (ref 0.0–0.1)
Basophils Relative: 0 % (ref 0–1)
Eosinophils Absolute: 0.1 10*3/uL (ref 0.0–0.7)
Eosinophils Relative: 3 % (ref 0–5)
HCT: 40.8 % (ref 36.0–46.0)
Hemoglobin: 14.2 g/dL (ref 12.0–15.0)
LYMPHS ABS: 0.5 10*3/uL — AB (ref 0.7–4.0)
Lymphocytes Relative: 17 % (ref 12–46)
MCH: 30.3 pg (ref 26.0–34.0)
MCHC: 34.8 g/dL (ref 30.0–36.0)
MCV: 87.2 fL (ref 78.0–100.0)
Monocytes Absolute: 0.3 10*3/uL (ref 0.1–1.0)
Monocytes Relative: 10 % (ref 3–12)
NEUTROS ABS: 1.9 10*3/uL (ref 1.7–7.7)
NEUTROS PCT: 70 % (ref 43–77)
PLATELETS: 247 10*3/uL (ref 150–400)
RBC: 4.68 MIL/uL (ref 3.87–5.11)
RDW: 13.3 % (ref 11.5–15.5)
WBC: 2.7 10*3/uL — ABNORMAL LOW (ref 4.0–10.5)

## 2013-09-18 NOTE — Patient Instructions (Signed)
Continue same medications. Return in one year or as needed. Continue to monitor blood pressure at home. Continue diet exercise regimen.

## 2013-09-18 NOTE — Progress Notes (Signed)
   Subjective:    Patient ID: Tamara Myers, female    DOB: 15-Oct-1955, 58 y.o.   MRN: [id number]  HPI 58 year old white female in today for health maintenance exam and evaluation of medical issues. History of hypertension well controlled on Norvasc 5 mg daily and Maxzide 25 mg daily.  No known drug allergies.  Past medical history: 2 C-sections in the past. Fractured right fifth metacarpal. Fractured left fifth finger at age 34.  Patient became menopausal in 2004. Tetanus immunization November 2010. History of fibrocystic breast disease. Had aspiration of right breast cyst in May 2012. History of right knee pain seen by orthopedist in 2011 at which time she received an injection of Xylocaine and Aristospan into the right knee.  Social history: Married with 2 adult children. Works for the town of [place]. Does not smoke or consume alcohol.  Family history: Father with history of CABG, diabetes mellitus, BOOP syndrome, hypertension, hyperlipidemia died of respiratory failure. Mother with history of Mnire's disease and melanoma living and doing well.  Patient is going with her husband to [place] in the fall. She may need to take some prophylactic antibiotics and Phenergan for nausea with her. She'll call for these prescriptions.  Review of Systems  Constitutional: Negative.   All other systems reviewed and are negative.      Objective:   Physical Exam  Vitals reviewed. Constitutional: She is oriented to person, place, and time. She appears well-developed and well-nourished. No distress.  HENT:  Head: Normocephalic and atraumatic.  Right Ear: External ear normal.  Left Ear: External ear normal.  Mouth/Throat: Oropharynx is clear and moist. No oropharyngeal exudate.  Eyes: Conjunctivae and EOM are normal. Pupils are equal, round, and reactive to light. Right eye exhibits no discharge. Left eye exhibits no discharge. No scleral icterus.  Neck: Neck supple. No JVD present. No  thyromegaly present.  Cardiovascular: Normal rate, regular rhythm, normal heart sounds and intact distal pulses.   No murmur heard. Pulmonary/Chest: Effort normal and breath sounds normal. No respiratory distress. She has no rales. She exhibits no tenderness.  Breasts normal female  Abdominal: Soft. Bowel sounds are normal. She exhibits no distension and no mass. There is no tenderness. There is no rebound and no guarding.  Genitourinary:  Bimanual exam normal. Pap done 2013 and will be repeated 2016  Musculoskeletal: Normal range of motion. She exhibits no edema.  Lymphadenopathy:    She has no cervical adenopathy.  Neurological: She is alert and oriented to person, place, and time. She has normal reflexes. She displays normal reflexes. No cranial nerve deficit. Coordination normal.  Skin: Skin is warm and dry. No rash noted. She is not diaphoretic.  Psychiatric: She has a normal mood and affect. Her behavior is normal. Judgment and thought content normal.          Assessment & Plan:  Hypertension stable with 2 drug regimen  Plan: Return in one year or as needed. Continue to monitor blood pressure at home. Watch diet and exercise. Urine will be cultured. She has no urinary tract infection symptoms but dipstick is slightly abnormal. She has noticed an odor but no dysuria. Fasting labs were drawn and are pending.

## 2013-09-19 LAB — LIPID PANEL
CHOL/HDL RATIO: 2.8 ratio
CHOLESTEROL: 210 mg/dL — AB (ref 0–200)
HDL: 75 mg/dL (ref >39)
LDL Cholesterol: 123 mg/dL — ABNORMAL HIGH (ref 0–99)
TRIGLYCERIDES: 61 mg/dL (ref <150)
VLDL: 12 mg/dL (ref 0–40)

## 2013-09-19 LAB — COMPREHENSIVE METABOLIC PANEL
ALT: 21 U/L (ref 0–35)
AST: 23 U/L (ref 0–37)
Albumin: 4.6 g/dL (ref 3.5–5.2)
Alkaline Phosphatase: 53 U/L (ref 39–117)
BILIRUBIN TOTAL: 0.6 mg/dL (ref 0.2–1.2)
BUN: 15 mg/dL (ref 6–23)
CO2: 30 mEq/L (ref 19–32)
Calcium: 9.4 mg/dL (ref 8.4–10.5)
Chloride: 103 mEq/L (ref 96–112)
Creat: 0.76 mg/dL (ref 0.50–1.10)
GLUCOSE: 86 mg/dL (ref 70–99)
Potassium: 3.7 mEq/L (ref 3.5–5.3)
Sodium: 141 mEq/L (ref 135–145)
Total Protein: 6.4 g/dL (ref 6.0–8.3)

## 2013-09-19 LAB — VITAMIN D 25 HYDROXY (VIT D DEFICIENCY, FRACTURES): Vit D, 25-Hydroxy: 39 ng/mL (ref 30–89)

## 2013-09-19 LAB — TSH: TSH: 0.636 u[IU]/mL (ref 0.350–4.500)

## 2013-09-21 LAB — URINE CULTURE

## 2013-09-23 ENCOUNTER — Other Ambulatory Visit: Payer: Self-pay

## 2013-09-23 MED ORDER — LEVOFLOXACIN 500 MG PO TABS: 500.0000 mg | ORAL_TABLET | Freq: Every day | ORAL | Status: DC

## 2013-10-02 ENCOUNTER — Other Ambulatory Visit (INDEPENDENT_AMBULATORY_CARE_PROVIDER_SITE_OTHER): Payer: BC Managed Care – PPO | Admitting: Internal Medicine

## 2013-10-02 VITALS — Temp 97.9°F

## 2013-10-02 DIAGNOSIS — N390 Urinary tract infection, site not specified: Secondary | ICD-10-CM

## 2013-10-02 LAB — POCT URINALYSIS DIPSTICK
BILIRUBIN UA: NEGATIVE
GLUCOSE UA: NEGATIVE
KETONES UA: NEGATIVE
Leukocytes, UA: NEGATIVE
Nitrite, UA: NEGATIVE
Protein, UA: NEGATIVE
RBC UA: NEGATIVE
Spec Grav, UA: 1.015
Urobilinogen, UA: NEGATIVE
pH, UA: 6.5

## 2013-10-02 NOTE — Progress Notes (Signed)
Urine is within normal limits. She is asymptomatic.

## 2014-01-09 ENCOUNTER — Other Ambulatory Visit: Payer: Self-pay

## 2014-03-06 ENCOUNTER — Other Ambulatory Visit: Payer: Self-pay

## 2014-03-06 DIAGNOSIS — Z1231 Encounter for screening mammogram for malignant neoplasm of breast: Secondary | ICD-10-CM

## 2014-04-13 ENCOUNTER — Ambulatory Visit
Admission: RE | Admit: 2014-04-13 | Discharge: 2014-04-13 | Disposition: A | Discharge status: Home / Self Care | Payer: BC Managed Care – PPO | Source: Ambulatory Visit

## 2014-04-13 DIAGNOSIS — Z1231 Encounter for screening mammogram for malignant neoplasm of breast: Secondary | ICD-10-CM

## 2014-04-14 ENCOUNTER — Other Ambulatory Visit: Payer: Self-pay | Admitting: Internal Medicine

## 2014-04-14 DIAGNOSIS — R928 Other abnormal and inconclusive findings on diagnostic imaging of breast: Secondary | ICD-10-CM

## 2014-04-30 ENCOUNTER — Ambulatory Visit
Admission: RE | Admit: 2014-04-30 | Discharge: 2014-04-30 | Disposition: A | Discharge status: Home / Self Care | Payer: BC Managed Care – PPO | Source: Ambulatory Visit | Attending: Internal Medicine | Admitting: Internal Medicine

## 2014-04-30 DIAGNOSIS — R928 Other abnormal and inconclusive findings on diagnostic imaging of breast: Secondary | ICD-10-CM

## 2014-06-08 ENCOUNTER — Other Ambulatory Visit: Payer: Self-pay | Admitting: Internal Medicine

## 2014-09-07 ENCOUNTER — Other Ambulatory Visit: Payer: Self-pay | Admitting: Internal Medicine

## 2014-10-13 ENCOUNTER — Encounter: Payer: Self-pay | Admitting: Internal Medicine

## 2014-10-13 ENCOUNTER — Ambulatory Visit (INDEPENDENT_AMBULATORY_CARE_PROVIDER_SITE_OTHER): Payer: BLUE CROSS/BLUE SHIELD | Admitting: Internal Medicine

## 2014-10-13 ENCOUNTER — Other Ambulatory Visit (HOSPITAL_COMMUNITY)
Admission: RE | Admit: 2014-10-13 | Discharge: 2014-10-13 | Disposition: A | Discharge status: Home / Self Care | Payer: BLUE CROSS/BLUE SHIELD | Source: Ambulatory Visit | Attending: Internal Medicine | Admitting: Internal Medicine

## 2014-10-13 VITALS — BP 118/62 | HR 73 | Temp 98.0°F | Ht 65.0 in | Wt 134.0 lb

## 2014-10-13 DIAGNOSIS — I10 Essential (primary) hypertension: Secondary | ICD-10-CM

## 2014-10-13 DIAGNOSIS — Z86018 Personal history of other benign neoplasm: Secondary | ICD-10-CM

## 2014-10-13 DIAGNOSIS — Z1321 Encounter for screening for nutritional disorder: Secondary | ICD-10-CM

## 2014-10-13 DIAGNOSIS — Z0000 Encounter for general adult medical examination without abnormal findings: Secondary | ICD-10-CM | POA: Diagnosis not present

## 2014-10-13 DIAGNOSIS — Z1329 Encounter for screening for other suspected endocrine disorder: Secondary | ICD-10-CM | POA: Diagnosis not present

## 2014-10-13 DIAGNOSIS — Z01419 Encounter for gynecological examination (general) (routine) without abnormal findings: Secondary | ICD-10-CM | POA: Diagnosis present

## 2014-10-13 DIAGNOSIS — Z872 Personal history of diseases of the skin and subcutaneous tissue: Secondary | ICD-10-CM | POA: Diagnosis not present

## 2014-10-13 DIAGNOSIS — Z130 Encounter for screening for diseases of the blood and blood-forming organs and certain disorders involving the immune mechanism: Secondary | ICD-10-CM

## 2014-10-13 DIAGNOSIS — Z85828 Personal history of other malignant neoplasm of skin: Secondary | ICD-10-CM | POA: Insufficient documentation

## 2014-10-13 DIAGNOSIS — Z13220 Encounter for screening for lipoid disorders: Secondary | ICD-10-CM | POA: Diagnosis not present

## 2014-10-13 DIAGNOSIS — N6019 Diffuse cystic mastopathy of unspecified breast: Secondary | ICD-10-CM

## 2014-10-13 LAB — POCT URINALYSIS DIPSTICK
BILIRUBIN UA: NEGATIVE
Glucose, UA: NEGATIVE
Ketones, UA: NEGATIVE
LEUKOCYTES UA: NEGATIVE
Nitrite, UA: NEGATIVE
PH UA: 7.5
Protein, UA: NEGATIVE
RBC UA: NEGATIVE
Spec Grav, UA: 1.01
UROBILINOGEN UA: NEGATIVE

## 2014-10-13 LAB — COMPLETE METABOLIC PANEL WITH GFR
ALBUMIN: 4.7 g/dL (ref 3.5–5.2)
ALK PHOS: 53 U/L (ref 39–117)
ALT: 19 U/L (ref 0–35)
AST: 24 U/L (ref 0–37)
BUN: 13 mg/dL (ref 6–23)
CO2: 27 mEq/L (ref 19–32)
Calcium: 9.4 mg/dL (ref 8.4–10.5)
Chloride: 101 mEq/L (ref 96–112)
Creat: 0.76 mg/dL (ref 0.50–1.10)
GFR, EST NON AFRICAN AMERICAN: 87 mL/min
GFR, Est African American: >89 mL/min
Glucose, Bld: 84 mg/dL (ref 70–99)
POTASSIUM: 3.7 meq/L (ref 3.5–5.3)
SODIUM: 140 meq/L (ref 135–145)
TOTAL PROTEIN: 6.4 g/dL (ref 6.0–8.3)
Total Bilirubin: 0.8 mg/dL (ref 0.2–1.2)

## 2014-10-13 LAB — LIPID PANEL
Cholesterol: 206 mg/dL — ABNORMAL HIGH (ref 0–200)
HDL: 84 mg/dL (ref >=46)
LDL Cholesterol: 108 mg/dL — ABNORMAL HIGH (ref 0–99)
Total CHOL/HDL Ratio: 2.5 Ratio
Triglycerides: 68 mg/dL (ref <150)
VLDL: 14 mg/dL (ref 0–40)

## 2014-10-13 LAB — TSH: TSH: 0.741 u[IU]/mL (ref 0.350–4.500)

## 2014-10-13 NOTE — Progress Notes (Signed)
   Subjective:    Patient ID: Tamara Myers, female    DOB: December 19, 1955, 59 y.o.   MRN: [id number]  HPI 59 year old White Female in today for health maintenance exam. Says that she feels well with no new complaints. History of essential hypertension and fibrocystic breast disease. Last Pap smear was in 2013. She is on amlodipine and diaphoretic for hypertension. Her weight is excellent. Her blood pressure is excellent. Fasting labs are drawn today and are pending. Tetanus immunization is up-to-date.  History of dysplastic nevus right leg treated by dermatologist, Dr. Sarajane Jews. Atypical nevus removed from left arm and a basal cell carcinoma removed in the past as well.  No known drug allergies.  Patient became menopausal in 2000. Has annual mammogram.  Past medical history: 2 C-sections in the past. Fractured right fifth metacarpal. Fractured left fifth finger at age 73. History of right knee pain seen by orthopedist 2011 at which time she received an injection of Xylocaine and Aristospan. Had aspiration of right breast cyst May 2012.  Social history: Works for the town of [place]. She's married with 2 adult children. Her mother is also a patient here. She does not smoke or consume alcohol.  Family history: Father with history of CABG, diabetes, boot syndrome, hypertension, hyperlipidemia died with respiratory failure. He had pulmonary fibrosis. Mother with history of Mnire's disease and history of melanoma.    Review of Systems  Constitutional: Negative.   All other systems reviewed and are negative.      Objective:   Physical Exam  Constitutional: She is oriented to person, place, and time. She appears well-developed and well-nourished. No distress.  HENT:  Head: Normocephalic and atraumatic.  Right Ear: External ear normal.  Left Ear: External ear normal.  Mouth/Throat: Oropharynx is clear and moist. No oropharyngeal exudate.  Eyes: Conjunctivae and EOM are normal. Pupils are  equal, round, and reactive to light. Right eye exhibits no discharge. Left eye exhibits no discharge. No scleral icterus.  Neck: Neck supple. No JVD present. No thyromegaly present.  Cardiovascular: Normal rate and regular rhythm.   No murmur heard. Pulmonary/Chest: Effort normal and breath sounds normal. No respiratory distress. She has no wheezes. She has no rales. She exhibits no tenderness.  Breasts normal female without masses  Abdominal: Soft. Bowel sounds are normal. She exhibits no distension and no mass. There is no tenderness. There is no rebound and no guarding.  Genitourinary:  Pap taken. Vaginal atrophy present. Bimanual normal.  Musculoskeletal: She exhibits no edema.  Lymphadenopathy:    She has no cervical adenopathy.  Neurological: She is alert and oriented to person, place, and time. She has normal reflexes. No cranial nerve deficit. Coordination normal.  Skin: Skin is warm and dry. No rash noted. She is not diaphoretic.  Psychiatric: She has a normal mood and affect. Her behavior is normal. Judgment and thought content normal.  Vitals reviewed.         Assessment & Plan:  Hypertension-under excellent control on 2 drug regimen  History of fibrocystic breast disease with breast cyst aspirated in 2012  History of basal cell carcinoma  History of dysplastic nevus-atypical melanocytic lesion  Plan: Continue same antihypertensives medications. Fasting labs drawn and pending. Have annual mammogram. Return in one year or as needed. Has never had colonoscopy. Has declined in the past Cologuard ordered

## 2014-10-13 NOTE — Patient Instructions (Signed)
Continue same medications and return in one year.Cologuard ordered

## 2014-10-14 LAB — CBC WITH DIFFERENTIAL/PLATELET
BASOS ABS: 0 10*3/uL (ref 0.0–0.1)
Basophils Relative: 0 % (ref 0–1)
Eosinophils Absolute: 0.1 10*3/uL (ref 0.0–0.7)
Eosinophils Relative: 5 % (ref 0–5)
HEMATOCRIT: 38.3 % (ref 36.0–46.0)
Hemoglobin: 13 g/dL (ref 12.0–15.0)
Lymphocytes Relative: 22 % (ref 12–46)
Lymphs Abs: 0.6 10*3/uL — ABNORMAL LOW (ref 0.7–4.0)
MCH: 29.4 pg (ref 26.0–34.0)
MCHC: 33.9 g/dL (ref 30.0–36.0)
MCV: 86.7 fL (ref 78.0–100.0)
MONO ABS: 0.2 10*3/uL (ref 0.1–1.0)
MPV: 10.2 fL (ref 8.6–12.4)
Monocytes Relative: 9 % (ref 3–12)
NEUTROS ABS: 1.6 10*3/uL — AB (ref 1.7–7.7)
Neutrophils Relative %: 64 % (ref 43–77)
PLATELETS: 249 10*3/uL (ref 150–400)
RBC: 4.42 MIL/uL (ref 3.87–5.11)
RDW: 13.3 % (ref 11.5–15.5)
WBC: 2.5 10*3/uL — ABNORMAL LOW (ref 4.0–10.5)

## 2014-10-14 LAB — VITAMIN D 25 HYDROXY (VIT D DEFICIENCY, FRACTURES): VIT D 25 HYDROXY: 26 ng/mL — AB (ref 30–100)

## 2014-10-15 LAB — CYTOLOGY - PAP

## 2014-11-08 LAB — COLOGUARD

## 2014-11-16 ENCOUNTER — Telehealth: Payer: Self-pay | Admitting: Internal Medicine

## 2014-11-16 ENCOUNTER — Encounter: Payer: Self-pay | Admitting: Internal Medicine

## 2014-11-16 NOTE — Telephone Encounter (Signed)
Patient submitted Cologuard Specimen received July 12. Results received today are negative. Patient will be informed by telephone.

## 2014-11-23 ENCOUNTER — Encounter (HOSPITAL_COMMUNITY): Payer: Self-pay | Admitting: *Deleted

## 2014-11-24 ENCOUNTER — Ambulatory Visit (HOSPITAL_COMMUNITY): Payer: PRIVATE HEALTH INSURANCE | Admitting: Certified Registered Nurse Anesthetist

## 2014-11-24 ENCOUNTER — Ambulatory Visit (HOSPITAL_COMMUNITY)
Admission: RE | Admit: 2014-11-24 | Discharge: 2014-11-24 | Disposition: A | Discharge status: Home / Self Care | Payer: PRIVATE HEALTH INSURANCE | Source: Ambulatory Visit | Attending: Orthopedic Surgery | Admitting: Orthopedic Surgery

## 2014-11-24 ENCOUNTER — Encounter (HOSPITAL_COMMUNITY)
Admission: RE | Disposition: A | Discharge status: Home / Self Care | Payer: Self-pay | Source: Ambulatory Visit | Attending: Orthopedic Surgery

## 2014-11-24 ENCOUNTER — Encounter (HOSPITAL_COMMUNITY): Payer: Self-pay | Admitting: *Deleted

## 2014-11-24 DIAGNOSIS — Z79899 Other long term (current) drug therapy: Secondary | ICD-10-CM | POA: Diagnosis not present

## 2014-11-24 DIAGNOSIS — S62241A Displaced fracture of shaft of first metacarpal bone, right hand, initial encounter for closed fracture: Secondary | ICD-10-CM | POA: Insufficient documentation

## 2014-11-24 DIAGNOSIS — I10 Essential (primary) hypertension: Secondary | ICD-10-CM | POA: Insufficient documentation

## 2014-11-24 DIAGNOSIS — X58XXXA Exposure to other specified factors, initial encounter: Secondary | ICD-10-CM | POA: Insufficient documentation

## 2014-11-24 DIAGNOSIS — Z85828 Personal history of other malignant neoplasm of skin: Secondary | ICD-10-CM | POA: Diagnosis not present

## 2014-11-24 HISTORY — DX: Other complications of anesthesia, initial encounter: T88.59XA

## 2014-11-24 HISTORY — PX: OPEN REDUCTION INTERNAL FIXATION (ORIF) METACARPAL: SHX6234

## 2014-11-24 HISTORY — DX: Adverse effect of unspecified anesthetic, initial encounter: T41.45XA

## 2014-11-24 HISTORY — DX: Malignant (primary) neoplasm, unspecified: C80.1

## 2014-11-24 LAB — BASIC METABOLIC PANEL
ANION GAP: 9 (ref 5–15)
BUN: 11 mg/dL (ref 6–20)
CHLORIDE: 102 mmol/L (ref 101–111)
CO2: 28 mmol/L (ref 22–32)
CREATININE: 0.78 mg/dL (ref 0.44–1.00)
Calcium: 9.2 mg/dL (ref 8.9–10.3)
GLUCOSE: 82 mg/dL (ref 65–99)
Potassium: 3.2 mmol/L — ABNORMAL LOW (ref 3.5–5.1)
SODIUM: 139 mmol/L (ref 135–145)

## 2014-11-24 LAB — CBC
HCT: 39.9 % (ref 36.0–46.0)
HEMOGLOBIN: 13.8 g/dL (ref 12.0–15.0)
MCH: 30.3 pg (ref 26.0–34.0)
MCHC: 34.6 g/dL (ref 30.0–36.0)
MCV: 87.7 fL (ref 78.0–100.0)
Platelets: 209 10*3/uL (ref 150–400)
RBC: 4.55 MIL/uL (ref 3.87–5.11)
RDW: 12.4 % (ref 11.5–15.5)
WBC: 2.8 10*3/uL — AB (ref 4.0–10.5)

## 2014-11-24 SURGERY — OPEN REDUCTION INTERNAL FIXATION (ORIF) METACARPAL
Anesthesia: General | Site: Thumb | Laterality: Right

## 2014-11-24 MED ORDER — ONDANSETRON HCL 4 MG/2ML IJ SOLN
INTRAMUSCULAR | Status: AC
  Filled 2014-11-24: qty 2

## 2014-11-24 MED ORDER — PROPOFOL 10 MG/ML IV BOLUS
INTRAVENOUS | Status: AC
  Filled 2014-11-24: qty 20

## 2014-11-24 MED ORDER — LACTATED RINGERS IV SOLN
INTRAVENOUS | Status: DC | PRN
  Administered 2014-11-24: 16:00:00 via INTRAVENOUS

## 2014-11-24 MED ORDER — DOCUSATE SODIUM 100 MG PO CAPS: 100.0000 mg | ORAL_CAPSULE | Freq: Two times a day (BID) | ORAL | Status: DC

## 2014-11-24 MED ORDER — CHLORHEXIDINE GLUCONATE 4 % EX LIQD: 60.0000 mL | Freq: Once | CUTANEOUS | Status: DC

## 2014-11-24 MED ORDER — HYDROCODONE-ACETAMINOPHEN 5-300 MG PO TABS: 1.0000 | ORAL_TABLET | Freq: Four times a day (QID) | ORAL | Status: DC | PRN

## 2014-11-24 MED ORDER — ARTIFICIAL TEARS OP OINT
TOPICAL_OINTMENT | OPHTHALMIC | Status: AC
  Filled 2014-11-24: qty 3.5

## 2014-11-24 MED ORDER — BUPIVACAINE HCL (PF) 0.25 % IJ SOLN
INTRAMUSCULAR | Status: AC
  Filled 2014-11-24: qty 30

## 2014-11-24 MED ORDER — EPHEDRINE SULFATE 50 MG/ML IJ SOLN
INTRAMUSCULAR | Status: AC
  Filled 2014-11-24: qty 1

## 2014-11-24 MED ORDER — BUPIVACAINE HCL (PF) 0.25 % IJ SOLN
INTRAMUSCULAR | Status: DC | PRN
  Administered 2014-11-24: 10 mL

## 2014-11-24 MED ORDER — ONDANSETRON HCL 4 MG/2ML IJ SOLN
INTRAMUSCULAR | Status: DC | PRN
  Administered 2014-11-24: 4 mg via INTRAVENOUS

## 2014-11-24 MED ORDER — PROMETHAZINE HCL 25 MG/ML IJ SOLN: 6.2500 mg | INTRAMUSCULAR | Status: DC | PRN

## 2014-11-24 MED ORDER — FENTANYL CITRATE (PF) 100 MCG/2ML IJ SOLN
INTRAMUSCULAR | Status: AC
  Filled 2014-11-24: qty 2

## 2014-11-24 MED ORDER — MIDAZOLAM HCL 2 MG/2ML IJ SOLN
INTRAMUSCULAR | Status: AC
  Filled 2014-11-24: qty 2

## 2014-11-24 MED ORDER — MIDAZOLAM HCL 5 MG/5ML IJ SOLN
INTRAMUSCULAR | Status: DC | PRN
  Administered 2014-11-24: 2 mg via INTRAVENOUS

## 2014-11-24 MED ORDER — PROPOFOL 10 MG/ML IV BOLUS
INTRAVENOUS | Status: DC | PRN
  Administered 2014-11-24: 150 mg via INTRAVENOUS

## 2014-11-24 MED ORDER — HYDROMORPHONE HCL 1 MG/ML IJ SOLN
INTRAMUSCULAR | Status: AC
  Filled 2014-11-24: qty 1

## 2014-11-24 MED ORDER — FENTANYL CITRATE (PF) 250 MCG/5ML IJ SOLN
INTRAMUSCULAR | Status: AC
  Filled 2014-11-24: qty 5

## 2014-11-24 MED ORDER — HYDROMORPHONE HCL 1 MG/ML IJ SOLN
0.2500 mg | INTRAMUSCULAR | Status: DC | PRN
  Administered 2014-11-24 (×2): 0.5 mg via INTRAVENOUS

## 2014-11-24 MED ORDER — STERILE WATER FOR INJECTION IJ SOLN
INTRAMUSCULAR | Status: AC
  Filled 2014-11-24: qty 10

## 2014-11-24 MED ORDER — 0.9 % SODIUM CHLORIDE (POUR BTL) OPTIME
TOPICAL | Status: DC | PRN
  Administered 2014-11-24: 1000 mL

## 2014-11-24 MED ORDER — CEFAZOLIN SODIUM-DEXTROSE 2-3 GM-% IV SOLR
INTRAVENOUS | Status: AC
  Filled 2014-11-24: qty 50

## 2014-11-24 MED ORDER — FENTANYL CITRATE (PF) 100 MCG/2ML IJ SOLN
INTRAMUSCULAR | Status: DC | PRN
  Administered 2014-11-24: 100 ug via INTRAVENOUS

## 2014-11-24 MED ORDER — LIDOCAINE HCL (CARDIAC) 20 MG/ML IV SOLN
INTRAVENOUS | Status: AC
  Filled 2014-11-24: qty 5

## 2014-11-24 MED ORDER — LACTATED RINGERS IV SOLN
INTRAVENOUS | Status: DC
  Administered 2014-11-24: 15:00:00 via INTRAVENOUS

## 2014-11-24 MED ORDER — CEFAZOLIN SODIUM-DEXTROSE 2-3 GM-% IV SOLR
2.0000 g | INTRAVENOUS | Status: AC
  Administered 2014-11-24: 2 g via INTRAVENOUS

## 2014-11-24 SURGICAL SUPPLY — 65 items
BANDAGE ELASTIC 3 VELCRO ST LF (GAUZE/BANDAGES/DRESSINGS) ×2 IMPLANT
BANDAGE ELASTIC 4 VELCRO ST LF (GAUZE/BANDAGES/DRESSINGS) ×2 IMPLANT
BIT DRILL 1.1 (BIT) ×1
BIT DRILL 60X20X1.1XQC TMX (BIT) ×1 IMPLANT
BIT DRL 60X20X1.1XQC TMX (BIT) ×1
BLADE SURG ROTATE 9660 (MISCELLANEOUS) IMPLANT
BNDG ELASTIC 2 VLCR STRL LF (GAUZE/BANDAGES/DRESSINGS) ×2 IMPLANT
BNDG ESMARK 4X9 LF (GAUZE/BANDAGES/DRESSINGS) ×2 IMPLANT
BNDG GAUZE ELAST 4 BULKY (GAUZE/BANDAGES/DRESSINGS) ×2 IMPLANT
CANISTER SUCTION 2500CC (MISCELLANEOUS) ×2 IMPLANT
CORDS BIPOLAR (ELECTRODE) ×2 IMPLANT
COVER SURGICAL LIGHT HANDLE (MISCELLANEOUS) ×2 IMPLANT
CUFF TOURNIQUET SINGLE 18IN (TOURNIQUET CUFF) ×2 IMPLANT
CUFF TOURNIQUET SINGLE 24IN (TOURNIQUET CUFF) IMPLANT
DRAIN TLS ROUND 10FR (DRAIN) IMPLANT
DRAPE OEC MINIVIEW 54X84 (DRAPES) ×2 IMPLANT
DRAPE SURG 17X11 SM STRL (DRAPES) ×2 IMPLANT
DRIVER BIT 1.5 (TRAUMA) ×2 IMPLANT
DRSG ADAPTIC 3X8 NADH LF (GAUZE/BANDAGES/DRESSINGS) IMPLANT
GAUZE SPONGE 4X4 12PLY STRL (GAUZE/BANDAGES/DRESSINGS) IMPLANT
GAUZE SPONGE 4X4 16PLY XRAY LF (GAUZE/BANDAGES/DRESSINGS) IMPLANT
GLOVE BIOGEL PI IND STRL 8.5 (GLOVE) ×1 IMPLANT
GLOVE BIOGEL PI INDICATOR 8.5 (GLOVE) ×1
GLOVE SURG ORTHO 8.0 STRL STRW (GLOVE) ×2 IMPLANT
GOWN STRL REUS W/ TWL LRG LVL3 (GOWN DISPOSABLE) ×3 IMPLANT
GOWN STRL REUS W/ TWL XL LVL3 (GOWN DISPOSABLE) ×1 IMPLANT
GOWN STRL REUS W/TWL LRG LVL3 (GOWN DISPOSABLE) ×3
GOWN STRL REUS W/TWL XL LVL3 (GOWN DISPOSABLE) ×1
KIT BASIN OR (CUSTOM PROCEDURE TRAY) ×2 IMPLANT
KIT ROOM TURNOVER OR (KITS) ×2 IMPLANT
LOCK SCREW 1.5X15MM (Screw) ×2 IMPLANT
MANIFOLD NEPTUNE II (INSTRUMENTS) IMPLANT
NEEDLE HYPO 25X1 1.5 SAFETY (NEEDLE) ×2 IMPLANT
NON LOCK SCREW 1.5X20MM (Screw) ×2 IMPLANT
NS IRRIG 1000ML POUR BTL (IV SOLUTION) ×2 IMPLANT
PACK ORTHO EXTREMITY (CUSTOM PROCEDURE TRAY) ×2 IMPLANT
PAD ARMBOARD 7.5X6 YLW CONV (MISCELLANEOUS) ×4 IMPLANT
PAD CAST 3X4 CTTN HI CHSV (CAST SUPPLIES) ×1 IMPLANT
PAD CAST 4YDX4 CTTN HI CHSV (CAST SUPPLIES) ×1 IMPLANT
PADDING CAST COTTON 3X4 STRL (CAST SUPPLIES) ×1
PADDING CAST COTTON 4X4 STRL (CAST SUPPLIES) ×1
PLATE T SMALL 1.5 131220157 (Plate) ×2 IMPLANT
SCREW LOCK 1.5X15MM (Screw) ×1 IMPLANT
SCREW LOCKING 1.5X13MM (Screw) ×4 IMPLANT
SCREW LOCKING 1.5X16 (Screw) ×2 IMPLANT
SCREW LOCKING 1.5X18MM (Screw) ×2 IMPLANT
SCREW NL 1.5X13 (Screw) ×4 IMPLANT
SCREW NON LOCK 1.5X20MM (Screw) ×1 IMPLANT
SOAP 2 % CHG 4 OZ (WOUND CARE) ×2 IMPLANT
SPLINT FIBERGLASS 4X30 (CAST SUPPLIES) ×2 IMPLANT
SPONGE GAUZE 4X4 12PLY STER LF (GAUZE/BANDAGES/DRESSINGS) ×2 IMPLANT
STRIP CLOSURE SKIN 1/2X4 (GAUZE/BANDAGES/DRESSINGS) IMPLANT
SUT ETHILON 4 0 PS 2 18 (SUTURE) IMPLANT
SUT MNCRL AB 3-0 PS2 18 (SUTURE) ×2 IMPLANT
SUT MNCRL AB 4-0 PS2 18 (SUTURE) ×2 IMPLANT
SUT PROLENE 4 0 PS 2 18 (SUTURE) ×2 IMPLANT
SUT VIC AB 2-0 FS1 27 (SUTURE) IMPLANT
SUT VICRYL 4-0 PS2 18IN ABS (SUTURE) IMPLANT
SYR CONTROL 10ML LL (SYRINGE) ×2 IMPLANT
SYSTEM CHEST DRAIN TLS 7FR (DRAIN) IMPLANT
TOWEL OR 17X24 6PK STRL BLUE (TOWEL DISPOSABLE) ×2 IMPLANT
TOWEL OR 17X26 10 PK STRL BLUE (TOWEL DISPOSABLE) ×2 IMPLANT
TUBE CONNECTING 12X1/4 (SUCTIONS) ×2 IMPLANT
WATER STERILE IRR 1000ML POUR (IV SOLUTION) IMPLANT
YANKAUER SUCT BULB TIP NO VENT (SUCTIONS) IMPLANT

## 2014-11-24 NOTE — Discharge Instructions (Signed)
[facility] CALL OFFICE FOR F/U APPT [phone] in 10 days DR [facility] CELL PHONE [phone] KEEP HAND ELEVATED ABOVE HEART OK TO APPLY ICE TO OPERATIVE AREA CONTACT OFFICE IF ANY WORSENING PAIN OR CONCERNS.

## 2014-11-24 NOTE — H&P (Signed)
Tamara Myers is an 59 y.o. female.   Chief Complaint: right thumb injury HPI: pt with injury to right thumb Pt seen/evaluated in office Here for surgery H/o right thumb metacarpal ligament collateral stabilization  Past Medical History  Diagnosis Date  . Hypertension   . Post menopausal syndrome   . Cancer     basal cell carcinoma  . Complication of anesthesia     slow to wake up    Past Surgical History  Procedure Laterality Date  . Cesarean section    . Thumb surgery Right 2014    Family History  Problem Relation Age of Onset  . Diabetes Father   . Heart disease Father   . Hyperlipidemia Father   . Hypertension Father    Social History:  reports that she has never smoked. She has never used smokeless tobacco. She reports that she does not drink alcohol or use illicit drugs.  Allergies: No Known Allergies  Medications Prior to Admission  Medication Sig Dispense Refill  . amLODipine (NORVASC) 5 MG tablet TAKE 1 TABLET BY MOUTH EVERY DAY 90 tablet 0  . Cholecalciferol (VITAMIN D) 2000 UNITS CAPS Take by mouth.    . triamterene-hydrochlorothiazide (MAXZIDE-25) 37.5-25 MG per tablet TAKE 1 TABLET BY MOUTH EVERY DAY 90 tablet 3    Results for orders placed or performed during the hospital encounter of 11/24/14 (from the past 48 hour(s))  Basic metabolic panel     Status: Abnormal   Collection Time: 11/24/14  2:38 PM  Result Value Ref Range   Sodium 139 135 - 145 mmol/L   Potassium 3.2 (L) 3.5 - 5.1 mmol/L   Chloride 102 101 - 111 mmol/L   CO2 28 22 - 32 mmol/L   Glucose, Bld 82 65 - 99 mg/dL   BUN 11 6 - 20 mg/dL   Creatinine, Ser 0.78 0.44 - 1.00 mg/dL   Calcium 9.2 8.9 - 10.3 mg/dL   GFR calc non Af Amer >60 >60 mL/min   GFR calc Af Amer >60 >60 mL/min    Comment: (NOTE) The eGFR has been calculated using the CKD EPI equation. This calculation has not been validated in all clinical situations. eGFR's persistently <60 mL/min signify possible Chronic  Kidney Disease.    Anion gap 9 5 - 15  CBC     Status: Abnormal   Collection Time: 11/24/14  2:38 PM  Result Value Ref Range   WBC 2.8 (L) 4.0 - 10.5 K/uL   RBC 4.55 3.87 - 5.11 MIL/uL   Hemoglobin 13.8 12.0 - 15.0 g/dL   HCT 39.9 36.0 - 46.0 %   MCV 87.7 78.0 - 100.0 fL   MCH 30.3 26.0 - 34.0 pg   MCHC 34.6 30.0 - 36.0 g/dL   RDW 12.4 11.5 - 15.5 %   Platelets 209 150 - 400 K/uL   No results found.  ROS NO RECENT ILLNESSES OR HOSPITALIZATIONS  Blood pressure 127/77, pulse 67, temperature 98.4 F (36.9 C), temperature source Oral, resp. rate 18, height _0  (1.626 m), weight 60.328 kg (133 lb), SpO2 97 %. Physical Exam  General Appearance:  Alert, cooperative, no distress, appears stated age  Head:  Normocephalic, without obvious abnormality, atraumatic  Eyes:  Pupils equal, conjunctiva/corneas clear,         Throat: Lips, mucosa, and tongue normal; teeth and gums normal  Neck: No visible masses     Lungs:   respirations unlabored  Chest Wall:  No tenderness or deformity  Heart:  Regular rate and rhythm,  Abdomen:   Soft, non-tender,         Extremities: RIGHT THUMB: SKIN INTACT FINGERS WARM WELL PERFUSED ABLE TO FLEX THUMB IP JOINT ABLE TO EXTEND THUMB MARKED SWELLING DORSUM OF HAND AND THUMB   Pulses: 2+ and symmetric  Skin: Skin color, texture, turgor normal, no rashes or lesions     Neurologic: Normal    Assessment/Plan RIGHT THUMB DISPLACED METACARPAL SHAFT FRACTURE  RIGHT THUMB OPEN REDUCTION AND INTERNAL FIXATION AND REPAIR AS INDICATED  R/B/A DISCUSSED WITH PT IN OFFICE.  PT VOICED UNDERSTANDING OF PLAN CONSENT SIGNED DAY OF SURGERY PT SEEN AND EXAMINED PRIOR TO OPERATIVE PROCEDURE/DAY OF SURGERY SITE MARKED. QUESTIONS ANSWERED WILL GO HOME FOLLOWING SURGERY WE ARE PLANNING SURGERY FOR YOUR UPPER EXTREMITY. THE RISKS AND BENEFITS OF SURGERY INCLUDE BUT NOT LIMITED TO BLEEDING INFECTION, DAMAGE TO NEARBY NERVES ARTERIES TENDONS, FAILURE OF SURGERY  TO ACCOMPLISH ITS INTENDED GOALS, PERSISTENT SYMPTOMS AND NEED FOR FURTHER SURGICAL INTERVENTION. WITH THIS IN MIND WE WILL PROCEED. I HAVE DISCUSSED WITH THE PATIENT THE PRE AND POSTOPERATIVE REGIMEN AND THE DOS AND DON'TS. PT VOICED UNDERSTANDING AND INFORMED CONSENT SIGNED.  Linna Hoff 11/24/2014, 3:50 PM

## 2014-11-24 NOTE — Anesthesia Postprocedure Evaluation (Signed)
  Anesthesia Post-op Note  Patient: Tamara Myers  Procedure(s) Performed: Procedure(s) (LRB): OPEN REDUCTION INTERNAL FIXATION (ORIF) RIGHT THUMB (Right)  Patient Location: PACU  Anesthesia Type: General  Level of Consciousness: awake and alert   Airway and Oxygen Therapy: Patient Spontanous Breathing  Post-op Pain: mild  Post-op Assessment: Post-op Vital signs reviewed, Patient's Cardiovascular Status Stable, Respiratory Function Stable, Patent Airway and No signs of Nausea or vomiting  Last Vitals:  Filed Vitals:   11/24/14 1900  BP: 123/69  Pulse: 73  Temp:   Resp: 18    Post-op Vital Signs: stable   Complications: No apparent anesthesia complications

## 2014-11-24 NOTE — Anesthesia Procedure Notes (Signed)
Procedure Name: LMA Insertion Date/Time: 11/24/2014 4:32 PM Performed by: Manus Gunning, Krithika Tome J Pre-anesthesia Checklist: Patient identified, Emergency Drugs available, Suction available, Patient being monitored and Timeout performed Patient Re-evaluated:Patient Re-evaluated prior to inductionOxygen Delivery Method: Circle system utilized Preoxygenation: Pre-oxygenation with 100% oxygen Intubation Type: IV induction Ventilation: Mask ventilation without difficulty LMA: LMA inserted LMA Size: 4.0 Number of attempts: 1 Placement Confirmation: positive ETCO2 and breath sounds checked- equal and bilateral Tube secured with: Tape Dental Injury: Teeth and Oropharynx as per pre-operative assessment

## 2014-11-24 NOTE — Transfer of Care (Signed)
Immediate Anesthesia Transfer of Care Note  Patient: Tamara Myers  Procedure(s) Performed: Procedure(s): OPEN REDUCTION INTERNAL FIXATION (ORIF) RIGHT THUMB (Right)  Patient Location: PACU  Anesthesia Type:General  Level of Consciousness: awake  Airway & Oxygen Therapy: Patient Spontanous Breathing  Post-op Assessment: Report given to RN and Post -op Vital signs reviewed and stable  Post vital signs: Reviewed and stable  Last Vitals:  Filed Vitals:   11/24/14 1743  BP:   Pulse:   Temp: 36.8 C  Resp:     Complications: No apparent anesthesia complications

## 2014-11-24 NOTE — Brief Op Note (Signed)
11/24/2014  3:52 PM  PATIENT:  Tamara Myers  59 y.o. female  PRE-OPERATIVE DIAGNOSIS:  RIGHT THUMB METACARPAL SHAFT FRACTURE  POST-OPERATIVE DIAGNOSIS:  * No post-op diagnosis entered *  PROCEDURE:  Procedure(s): OPEN REDUCTION INTERNAL FIXATION (ORIF) RIGHT THUMB (Right)  SURGEON:  Surgeon(s) and Role:    * Iran Planas, MD - Primary  PHYSICIAN ASSISTANT:   ASSISTANTS: none   ANESTHESIA:   general  EBL:     BLOOD ADMINISTERED:none  DRAINS: none   LOCAL MEDICATIONS USED:  MARCAINE     SPECIMEN:  No Specimen  DISPOSITION OF SPECIMEN:  N/A  COUNTS:  YES  TOURNIQUET:    DICTATION: .Other Dictation: Dictation Number [id number]  PLAN OF CARE: Discharge to home after PACU  PATIENT DISPOSITION:  PACU - hemodynamically stable.   Delay start of Pharmacological VTE agent (>24hrs) due to surgical blood loss or risk of bleeding: not applicable

## 2014-11-24 NOTE — Anesthesia Preprocedure Evaluation (Signed)
Anesthesia Evaluation  Patient identified by MRN, date of birth, ID band Patient awake    Reviewed: Allergy & Precautions, H&P , NPO status , Patient's Chart, lab work & pertinent test results  History of Anesthesia Complications (+) history of anesthetic complications  Airway Mallampati: I  TM Distance: >3 FB Neck ROM: full    Dental  (+) Teeth Intact, Dental Advidsory Given   Pulmonary neg pulmonary ROS,  breath sounds clear to auscultation        Cardiovascular hypertension, On Medications Rhythm:regular Rate:Normal     Neuro/Psych negative neurological ROS  negative psych ROS   GI/Hepatic negative GI ROS, Neg liver ROS,   Endo/Other  negative endocrine ROS  Renal/GU negative Renal ROS     Musculoskeletal   Abdominal   Peds  Hematology   Anesthesia Other Findings   Reproductive/Obstetrics negative OB ROS                             Anesthesia Physical Anesthesia Plan  ASA: II  Anesthesia Plan: General LMA   Post-op Pain Management:    Induction:   Airway Management Planned:   Additional Equipment:   Intra-op Plan:   Post-operative Plan:   Informed Consent: I have reviewed the patients History and Physical, chart, labs and discussed the procedure including the risks, benefits and alternatives for the proposed anesthesia with the patient or authorized representative who has indicated his/her understanding and acceptance.   Dental Advisory Given  Plan Discussed with: Anesthesiologist, CRNA and Surgeon  Anesthesia Plan Comments:         Anesthesia Quick Evaluation

## 2014-11-25 ENCOUNTER — Encounter (HOSPITAL_COMMUNITY): Payer: Self-pay | Admitting: Orthopedic Surgery

## 2014-11-25 NOTE — Op Note (Signed)
NAMEMILANA, SALAY NO.:  [id number]  MEDICAL RECORD NO.:  [id number]  LOCATION:  [facility]                         FACILITY:  [facility]  PHYSICIAN:  Linna Hoff IV, M.D.DATE OF BIRTH:  1955/10/21  DATE OF PROCEDURE:  11/24/2014 DATE OF DISCHARGE:  11/24/2014                              OPERATIVE REPORT   PREOPERATIVE DIAGNOSIS:  Right thumb metacarpal displaced shaft fracture.  POSTOPERATIVE DIAGNOSIS:  Right thumb metacarpal displaced shaft fracture.  ATTENDING PHYSICIAN:  Linna Hoff, M.D., who scrubbed and was present for the entire procedure.  ASSISTANT SURGEON:  None.  ANESTHESIA:  General via LMA.  SURGICAL PROCEDURE: 1. Open treatment of right thumb metacarpal shaft fracture, requiring     internal fixation. 2. Radiographs 3 views, right thumb.  SURGICAL IMPLANTS:  DePuy hand-out system 1.5 mm plates, T plate with 3 proximal locking screws and 4 combination of locking and nonlocking screws distally, 1.5 mm screws.  SURGICAL INDICATIONS:  Ms. Brougher is a 59 year old right-hand-dominant female, who sustained a displaced metacarpal shaft fracture.  The patient was seen and evaluated in the office and recommended to undergo the above procedure.  Risks, benefits, and alternatives were discussed in detail with the patient.  Signed informed consent was obtained. Risks include, but not limited to bleeding; infection; damage to nearby nerves, arteries or tendons; nonunion; malunion; hardware failure; loss of motion of wrist and digits, incomplete relief of symptoms; and need for further surgical intervention.  DESCRIPTION OF PROCEDURE:  The patient was properly identified in the preoperative holding area and marked with a permanent marker made on the right thumb to indicate correct operative site.  The patient was brought back to the operating room, placed supine on the anesthesia room table. General anesthesia was administered.  The patient  tolerated this well. A well-padded tourniquet was placed in the right brachium, sealed with 1000 drape.  The right upper extremity was then prepped and draped in normal sterile fashion.  Time-out was called, correct side was identified, and procedure was then begun.  Attention was then turned towards the right thumb.  The limb was then elevated using Esmarch exsanguination and tourniquet insufflated.  A longitudinal incision made directly over the dorsal aspect of the thumb metacarpal.  Dissection was carried down through the skin and subcutaneous tissue.  Deep dissection carried down between the extensor interval, which is incised longitudinally exposing the fracture site.  Large periosteal flaps were then elevated and the fracture site was then opened up.  Then the fracture hematoma was then evacuated.  An open reduction was then performed.  There is a known placement of a reduction clamp.  Position confirmed using mini C-arm and held nicely.  Following this, the T-plate was then applied and the K-wires were then placed on the dorsal aspect of the thumb metacarpal and the mini C-arm was used to confirm placement.  Following this, screw fixation was then carried out.  Distal and proximal screws were then placed with combination of locking and nonlocking screws.  The appropriate drilling with a 1.1 mm drill bit and screw placement.  Final radiographs of the thumb were then obtained. The wound was then irrigated.  The fascial layer was then closed with 3- 0 Monocryl suture.  Subcutaneous tissues closed with 4-0 Monocryl.  Skin closed with horizontal mattress Prolene sutures.  Adaptic dressing and 10 mL of 0.25% Marcaine infiltrated locally.  Adaptic dressing, sterile compressive bandage were applied.  The patient was then placed in a well- padded thumb spica splint, extubated, and taken to recovery room in good condition.  RADIOGRAPHIC INTERPRETATION:  AP, lateral, and oblique views of  the thumb did show the dorsal plate fixation in good position.  Good alignment in metacarpal shaft.  POSTPROCEDURE PLAN:  The patient discharged home, seen back in the office in approximately 2 weeks for wound check, suture removal, and begin a postoperative ORIF protocol with metacarpal plates and screws. Radiographs at each visit.     Melrose Nakayama, M.D.     FWO/MEDQ  D:  11/24/2014  T:  11/25/2014  Job:  [id number]

## 2014-11-27 ENCOUNTER — Encounter: Payer: Self-pay | Admitting: Internal Medicine

## 2014-12-01 ENCOUNTER — Other Ambulatory Visit: Payer: Self-pay | Admitting: Internal Medicine

## 2015-03-22 ENCOUNTER — Other Ambulatory Visit: Payer: Self-pay

## 2015-03-22 DIAGNOSIS — Z1231 Encounter for screening mammogram for malignant neoplasm of breast: Secondary | ICD-10-CM

## 2015-04-12 ENCOUNTER — Ambulatory Visit
Admission: RE | Admit: 2015-04-12 | Discharge: 2015-04-12 | Disposition: A | Discharge status: Home / Self Care | Payer: PRIVATE HEALTH INSURANCE | Source: Ambulatory Visit

## 2015-04-12 DIAGNOSIS — Z1231 Encounter for screening mammogram for malignant neoplasm of breast: Secondary | ICD-10-CM

## 2015-05-30 ENCOUNTER — Other Ambulatory Visit: Payer: Self-pay | Admitting: Internal Medicine

## 2015-06-05 ENCOUNTER — Other Ambulatory Visit: Payer: Self-pay | Admitting: Internal Medicine

## 2015-08-25 ENCOUNTER — Other Ambulatory Visit: Payer: Self-pay | Admitting: Internal Medicine

## 2015-09-23 ENCOUNTER — Ambulatory Visit (INDEPENDENT_AMBULATORY_CARE_PROVIDER_SITE_OTHER): Payer: PRIVATE HEALTH INSURANCE | Admitting: Internal Medicine

## 2015-09-23 ENCOUNTER — Encounter: Payer: Self-pay | Admitting: Internal Medicine

## 2015-09-23 VITALS — BP 142/80 | HR 74 | Temp 97.8°F | Resp 18 | Wt 140.5 lb

## 2015-09-23 DIAGNOSIS — B029 Zoster without complications: Secondary | ICD-10-CM

## 2015-09-23 MED ORDER — VALACYCLOVIR HCL 1 G PO TABS: 1000.0000 mg | ORAL_TABLET | Freq: Three times a day (TID) | ORAL | Status: DC

## 2015-09-23 NOTE — Progress Notes (Signed)
   Subjective:    Patient ID: Tamara Myers, female    DOB: 24-Jul-1955, 60 y.o.   MRN: [id number]  HPI  5 day history of rash on  left buttock now extending around her left  waist to her navel. Rash described as burning and irritated. Most uncomfortable at night. Has not had Zostavax vaccine.    Review of Systems     Objective:   Physical Exam  Erythematous confluent rash on left buttock extending around waist towards navel with more discrete lesions. No open sores, vesicles, or drainage      Assessment & Plan:  Herpes zoster  Plan: It's been more than 48 hours since onset of rash but were going to try Valtrex 1 g 3 times daily for 7 days. Patient took sleeping pill last night for sleep and did get some relief and rest. Apply calamine lotion to rash until healed.

## 2015-09-23 NOTE — Patient Instructions (Addendum)
Valtrex 1 gram 3 times daily for 7 days. Calamine lotion to rash until healed.

## 2015-10-14 ENCOUNTER — Telehealth: Payer: Self-pay | Admitting: Internal Medicine

## 2015-10-14 NOTE — Telephone Encounter (Signed)
Call in Cipro 500 mg bid x 7 days 

## 2015-10-14 NOTE — Telephone Encounter (Signed)
She was going to be in for a CPE on 6/22 and we re-scheduled due to you being out of the office.  She was going to ask for a Rx for being out of the country and traveling.  Wants to know if you will send a Rx for her to pharmacy for Cipro.  She will be going out of the country leaving on 7/30 and returning on 8/14.  Re-scheduled for CPE 8/17 labs and CPE 8/18.   Pharmacy:  [facility] on [address]  Thank you.

## 2015-10-15 MED ORDER — CIPROFLOXACIN HCL 500 MG PO TABS: 500.0000 mg | ORAL_TABLET | Freq: Two times a day (BID) | ORAL | Status: DC

## 2015-10-21 ENCOUNTER — Encounter: Payer: PRIVATE HEALTH INSURANCE | Admitting: Internal Medicine

## 2015-11-24 ENCOUNTER — Other Ambulatory Visit: Payer: Self-pay | Admitting: Internal Medicine

## 2015-12-16 ENCOUNTER — Other Ambulatory Visit: Payer: PRIVATE HEALTH INSURANCE | Admitting: Internal Medicine

## 2015-12-16 ENCOUNTER — Other Ambulatory Visit: Payer: Self-pay | Admitting: Internal Medicine

## 2015-12-16 DIAGNOSIS — Z0000 Encounter for general adult medical examination without abnormal findings: Secondary | ICD-10-CM

## 2015-12-16 DIAGNOSIS — Z85828 Personal history of other malignant neoplasm of skin: Secondary | ICD-10-CM

## 2015-12-16 DIAGNOSIS — N6019 Diffuse cystic mastopathy of unspecified breast: Secondary | ICD-10-CM

## 2015-12-16 DIAGNOSIS — I10 Essential (primary) hypertension: Secondary | ICD-10-CM

## 2015-12-16 LAB — LIPID PANEL
Cholesterol: 212 mg/dL — ABNORMAL HIGH (ref 125–200)
HDL: 91 mg/dL (ref >=46)
LDL CALC: 111 mg/dL (ref <130)
TRIGLYCERIDES: 52 mg/dL (ref <150)
Total CHOL/HDL Ratio: 2.3 Ratio (ref <=5.0)
VLDL: 10 mg/dL (ref <30)

## 2015-12-16 LAB — COMPLETE METABOLIC PANEL WITH GFR
ALT: 18 U/L (ref 6–29)
AST: 24 U/L (ref 10–35)
Albumin: 4.3 g/dL (ref 3.6–5.1)
Alkaline Phosphatase: 45 U/L (ref 33–130)
BILIRUBIN TOTAL: 0.4 mg/dL (ref 0.2–1.2)
BUN: 16 mg/dL (ref 7–25)
CO2: 25 mmol/L (ref 20–31)
CREATININE: 0.78 mg/dL (ref 0.50–1.05)
Calcium: 9.4 mg/dL (ref 8.6–10.4)
Chloride: 104 mmol/L (ref 98–110)
GFR, Est Non African American: 83 mL/min (ref >=60)
Glucose, Bld: 85 mg/dL (ref 65–99)
Potassium: 3.6 mmol/L (ref 3.5–5.3)
Sodium: 139 mmol/L (ref 135–146)
TOTAL PROTEIN: 6.5 g/dL (ref 6.1–8.1)

## 2015-12-16 LAB — CBC WITH DIFFERENTIAL/PLATELET
BASOS ABS: 0 {cells}/uL (ref 0–200)
Basophils Relative: 0 %
Eosinophils Absolute: 52 cells/uL (ref 15–500)
Eosinophils Relative: 2 %
HCT: 36.6 % (ref 35.0–45.0)
Hemoglobin: 11.8 g/dL (ref 11.7–15.5)
Lymphocytes Relative: 22 %
Lymphs Abs: 572 cells/uL — ABNORMAL LOW (ref 850–3900)
MCH: 25.9 pg — AB (ref 27.0–33.0)
MCHC: 32.2 g/dL (ref 32.0–36.0)
MCV: 80.4 fL (ref 80.0–100.0)
MONOS PCT: 9 %
MPV: 10.3 fL (ref 7.5–12.5)
Monocytes Absolute: 234 cells/uL (ref 200–950)
NEUTROS PCT: 67 %
Neutro Abs: 1742 cells/uL (ref 1500–7800)
PLATELETS: 292 10*3/uL (ref 140–400)
RBC: 4.55 MIL/uL (ref 3.80–5.10)
RDW: 13.6 % (ref 11.0–15.0)
WBC: 2.6 10*3/uL — ABNORMAL LOW (ref 3.8–10.8)

## 2015-12-16 LAB — TSH: TSH: 0.73 m[IU]/L

## 2015-12-17 ENCOUNTER — Telehealth: Payer: Self-pay

## 2015-12-17 ENCOUNTER — Encounter: Payer: Self-pay | Admitting: Internal Medicine

## 2015-12-17 ENCOUNTER — Ambulatory Visit (INDEPENDENT_AMBULATORY_CARE_PROVIDER_SITE_OTHER): Payer: PRIVATE HEALTH INSURANCE | Admitting: Internal Medicine

## 2015-12-17 VITALS — BP 124/82 | HR 68 | Temp 98.0°F | Ht 64.0 in | Wt 133.0 lb

## 2015-12-17 DIAGNOSIS — N6019 Diffuse cystic mastopathy of unspecified breast: Secondary | ICD-10-CM

## 2015-12-17 DIAGNOSIS — Z0000 Encounter for general adult medical examination without abnormal findings: Secondary | ICD-10-CM | POA: Diagnosis not present

## 2015-12-17 DIAGNOSIS — I10 Essential (primary) hypertension: Secondary | ICD-10-CM | POA: Diagnosis not present

## 2015-12-17 DIAGNOSIS — D72810 Lymphocytopenia: Secondary | ICD-10-CM | POA: Diagnosis not present

## 2015-12-17 LAB — POCT URINALYSIS DIPSTICK
BILIRUBIN UA: NEGATIVE
Blood, UA: NEGATIVE
Glucose, UA: NEGATIVE
Ketones, UA: NEGATIVE
LEUKOCYTES UA: NEGATIVE
NITRITE UA: NEGATIVE
PH UA: 6
Protein, UA: NEGATIVE
Spec Grav, UA: <=1.005
Urobilinogen, UA: 0.2

## 2015-12-17 LAB — VITAMIN D 25 HYDROXY (VIT D DEFICIENCY, FRACTURES): Vit D, 25-Hydroxy: 34 ng/mL (ref 30–100)

## 2015-12-17 NOTE — Telephone Encounter (Signed)
Spoke to [employer]. Added Pathologist Smear Review.

## 2015-12-17 NOTE — Progress Notes (Signed)
   Subjective:    Patient ID: Tamara Myers, female    DOB: 02/24/1956, 60 y.o.   MRN: [id number]  HPI Pleasant 60 year old White Female in today for health maintenance exam and evaluation of medical issues.  History of open reduction internal fixation right thumb July 2016 by Dr. Caralyn Guile. This was due to a right thumb metacarpal shaft fracture.  Had mammogram December 2016.  No bone density study on file.  History of essential hypertension and fibrocystic breast disease.   No known drug allergies  History of dysplastic nevus right leg treated by dermatologist, Dr. Sarajane Jews. Atypical nevus removed from left arm and a basal cell carcinoma removed in the past as well.  Became menopausal in 2000.  Social history: Works for the town of [place]. She is married with 2 adult children. Her mother is also a patient here. Patient does not smoke or consume alcohol.   Family history: Father with history of CABG, diabetes, BOOP syndrome, hyperlipidemia, hypertension died with respiratory failure. He had pulmonary fibrosis. Mother with history of Mnire's disease and remote history of melanoma.  2 C-sections in the past. Fractured right fifth metacarpal. Fractured left fifth finger at age 74. History of right knee pain seen by orthopedist 2011 at which time she received an injection of Xylocaine and Aristospan. History of aspiration right breast cyst May 2012.    Review of Systems  Constitutional: Negative.   All other systems reviewed and are negative.      Objective:   Physical Exam  Constitutional: She is oriented to person, place, and time. She appears well-developed and well-nourished. No distress.  HENT:  Head: Normocephalic and atraumatic.  Right Ear: External ear normal.  Left Ear: External ear normal.  Mouth/Throat: Oropharynx is clear and moist. No oropharyngeal exudate.  Eyes: Conjunctivae and EOM are normal. Pupils are equal, round, and reactive to light. Right eye  exhibits no discharge. Left eye exhibits no discharge. No scleral icterus.  Neck: Neck supple. No JVD present. No thyromegaly present.  Cardiovascular: Normal rate, regular rhythm and normal heart sounds.   No murmur heard. Pulmonary/Chest: Effort normal and breath sounds normal. No respiratory distress. She has no wheezes. She has no rales.  Breasts normal female  Abdominal: Soft. Bowel sounds are normal. She exhibits no distension and no mass. There is no tenderness. There is no rebound and no guarding.  Genitourinary:  Genitourinary Comments: Pap taken 2016. Vaginal atrophy present. Bimanual normal.  Musculoskeletal: She exhibits no edema.  Lymphadenopathy:    She has no cervical adenopathy.  Neurological: She is alert and oriented to person, place, and time. She has normal reflexes. No cranial nerve deficit. Coordination normal.  Skin: Skin is warm and dry. No rash noted. She is not diaphoretic.  Psychiatric: She has a normal mood and affect. Her behavior is normal. Judgment and thought content normal.  Vitals reviewed.         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 Continue to follow.  Plan: Continue same medications and return in one year or as needed. Reminded about colonoscopy and annual mammogram. Needs bone density study.

## 2015-12-20 LAB — PATHOLOGIST SMEAR REVIEW

## 2015-12-21 ENCOUNTER — Telehealth: Payer: Self-pay

## 2015-12-21 NOTE — Telephone Encounter (Signed)
-----   Message from Elby Showers, MD sent at 12/20/2015  2:50 PM EDT ----- No malignant cells identified. Continue to monitor.

## 2015-12-21 NOTE — Telephone Encounter (Signed)
Called patient. Gave imaging results. Patient verbalized understanding.   

## 2015-12-24 NOTE — Patient Instructions (Signed)
Continue current medications and return in one year or as needed. Repeat CBC in 6 months due to lymphopenia.

## 2016-02-18 ENCOUNTER — Other Ambulatory Visit: Payer: Self-pay | Admitting: Internal Medicine

## 2016-05-01 DIAGNOSIS — C439 Malignant melanoma of skin, unspecified: Secondary | ICD-10-CM

## 2016-05-01 HISTORY — DX: Malignant melanoma of skin, unspecified: C43.9

## 2016-05-14 ENCOUNTER — Other Ambulatory Visit: Payer: Self-pay | Admitting: Internal Medicine

## 2016-05-30 ENCOUNTER — Other Ambulatory Visit: Payer: Self-pay | Admitting: Internal Medicine

## 2016-05-30 DIAGNOSIS — Z1231 Encounter for screening mammogram for malignant neoplasm of breast: Secondary | ICD-10-CM

## 2016-06-20 ENCOUNTER — Ambulatory Visit
Admission: RE | Admit: 2016-06-20 | Discharge: 2016-06-20 | Disposition: A | Discharge status: Home / Self Care | Payer: PRIVATE HEALTH INSURANCE | Source: Ambulatory Visit | Attending: Internal Medicine | Admitting: Internal Medicine

## 2016-06-20 DIAGNOSIS — Z1231 Encounter for screening mammogram for malignant neoplasm of breast: Secondary | ICD-10-CM

## 2016-08-11 ENCOUNTER — Other Ambulatory Visit: Payer: Self-pay | Admitting: Internal Medicine

## 2016-12-19 ENCOUNTER — Other Ambulatory Visit: Payer: PRIVATE HEALTH INSURANCE | Admitting: Internal Medicine

## 2016-12-19 DIAGNOSIS — I10 Essential (primary) hypertension: Secondary | ICD-10-CM

## 2016-12-19 DIAGNOSIS — Z0000 Encounter for general adult medical examination without abnormal findings: Secondary | ICD-10-CM

## 2016-12-19 DIAGNOSIS — Z1329 Encounter for screening for other suspected endocrine disorder: Secondary | ICD-10-CM

## 2016-12-19 DIAGNOSIS — Z13220 Encounter for screening for lipoid disorders: Secondary | ICD-10-CM

## 2016-12-19 DIAGNOSIS — Z85828 Personal history of other malignant neoplasm of skin: Secondary | ICD-10-CM

## 2016-12-19 DIAGNOSIS — Z1321 Encounter for screening for nutritional disorder: Secondary | ICD-10-CM

## 2016-12-19 LAB — CBC WITH DIFFERENTIAL/PLATELET
Basophils Absolute: 0 cells/uL (ref 0–200)
Basophils Relative: 0 %
Eosinophils Absolute: 93 cells/uL (ref 15–500)
Eosinophils Relative: 3 %
HEMATOCRIT: 43.5 % (ref 35.0–45.0)
Hemoglobin: 14.7 g/dL (ref 11.7–15.5)
LYMPHS ABS: 682 {cells}/uL — AB (ref 850–3900)
Lymphocytes Relative: 22 %
MCH: 30.2 pg (ref 27.0–33.0)
MCHC: 33.8 g/dL (ref 32.0–36.0)
MCV: 89.3 fL (ref 80.0–100.0)
MONO ABS: 310 {cells}/uL (ref 200–950)
MPV: 10 fL (ref 7.5–12.5)
Monocytes Relative: 10 %
NEUTROS ABS: 2015 {cells}/uL (ref 1500–7800)
NEUTROS PCT: 65 %
Platelets: 252 10*3/uL (ref 140–400)
RBC: 4.87 MIL/uL (ref 3.80–5.10)
RDW: 12.7 % (ref 11.0–15.0)
WBC: 3.1 10*3/uL — AB (ref 3.8–10.8)

## 2016-12-19 NOTE — Addendum Note (Signed)
Addended by: Mady Haagensen on: 12/19/2016 09:23 AM   Modules accepted: Orders

## 2016-12-20 LAB — LIPID PANEL
CHOL/HDL RATIO: 2.5 ratio (ref <5.0)
CHOLESTEROL: 229 mg/dL — AB (ref <200)
HDL: 90 mg/dL (ref >50)
LDL Cholesterol: 124 mg/dL — ABNORMAL HIGH (ref <100)
TRIGLYCERIDES: 76 mg/dL (ref <150)
VLDL: 15 mg/dL (ref <30)

## 2016-12-20 LAB — COMPLETE METABOLIC PANEL WITH GFR
ALT: 18 U/L (ref 6–29)
AST: 21 U/L (ref 10–35)
Albumin: 4.5 g/dL (ref 3.6–5.1)
Alkaline Phosphatase: 45 U/L (ref 33–130)
BUN: 12 mg/dL (ref 7–25)
CALCIUM: 9.5 mg/dL (ref 8.6–10.4)
CHLORIDE: 103 mmol/L (ref 98–110)
CO2: 24 mmol/L (ref 20–32)
Creat: 0.8 mg/dL (ref 0.50–0.99)
GFR, EST NON AFRICAN AMERICAN: 80 mL/min (ref >=60)
GFR, Est African American: >89 mL/min (ref >=60)
GLUCOSE: 91 mg/dL (ref 65–99)
POTASSIUM: 3.6 mmol/L (ref 3.5–5.3)
SODIUM: 140 mmol/L (ref 135–146)
Total Bilirubin: 0.6 mg/dL (ref 0.2–1.2)
Total Protein: 6.4 g/dL (ref 6.1–8.1)

## 2016-12-20 LAB — VITAMIN D 25 HYDROXY (VIT D DEFICIENCY, FRACTURES): VIT D 25 HYDROXY: 29 ng/mL — AB (ref 30–100)

## 2016-12-20 LAB — TSH: TSH: 0.98 mIU/L

## 2016-12-21 ENCOUNTER — Ambulatory Visit (INDEPENDENT_AMBULATORY_CARE_PROVIDER_SITE_OTHER): Payer: PRIVATE HEALTH INSURANCE | Admitting: Internal Medicine

## 2016-12-21 ENCOUNTER — Encounter: Payer: Self-pay | Admitting: Internal Medicine

## 2016-12-21 VITALS — BP 120/78 | HR 71 | Temp 98.7°F

## 2016-12-21 DIAGNOSIS — Z23 Encounter for immunization: Secondary | ICD-10-CM | POA: Diagnosis not present

## 2016-12-21 DIAGNOSIS — E7800 Pure hypercholesterolemia, unspecified: Secondary | ICD-10-CM

## 2016-12-21 DIAGNOSIS — Z0000 Encounter for general adult medical examination without abnormal findings: Secondary | ICD-10-CM

## 2016-12-21 DIAGNOSIS — D708 Other neutropenia: Secondary | ICD-10-CM

## 2016-12-21 DIAGNOSIS — I10 Essential (primary) hypertension: Secondary | ICD-10-CM

## 2016-12-21 DIAGNOSIS — K219 Gastro-esophageal reflux disease without esophagitis: Secondary | ICD-10-CM

## 2016-12-21 DIAGNOSIS — E559 Vitamin D deficiency, unspecified: Secondary | ICD-10-CM | POA: Diagnosis not present

## 2016-12-21 DIAGNOSIS — N6019 Diffuse cystic mastopathy of unspecified breast: Secondary | ICD-10-CM

## 2016-12-21 LAB — POCT URINALYSIS DIPSTICK
Bilirubin, UA: NEGATIVE
Blood, UA: NEGATIVE
Glucose, UA: NEGATIVE
Ketones, UA: NEGATIVE
LEUKOCYTES UA: NEGATIVE
Nitrite, UA: NEGATIVE
PH UA: 6.5 (ref 5.0–8.0)
PROTEIN UA: NEGATIVE
SPEC GRAV UA: 1.015 (ref 1.010–1.025)
UROBILINOGEN UA: 0.2 U/dL

## 2016-12-21 MED ORDER — PANTOPRAZOLE SODIUM 40 MG PO TBEC: 40.0000 mg | DELAYED_RELEASE_TABLET | Freq: Every day | ORAL | 1 refills | Status: DC

## 2016-12-21 NOTE — Progress Notes (Signed)
Subjective:    Patient ID: Tamara Myers, female    DOB: [birth year], 61 y.o.   MRN: [id number]  HPI  61 year old Female for health maintenance and evalaution of medical issues.She has a history of hyperlipidemia and vitamin D deficiency.  History of open reduction internal fixation right thumb July 2016 by Dr. Caralyn Guile. This was due to a right thumb metacarpal shaft fracture.  History of essential hypertension and fibrocystic breast disease.  No known drug allergies.  History of dysplastic nevus right leg treated by Dr. Sarajane Jews, dermatologist. Atypical nevus removed from left arm and a basal cell carcinoma removed in the past as well.  Became menopausal in 2000. Patient has had 2 C-sections in the past. Fractured right fifth metacarpal. Fractured left fifth finger at age 76. History of right knee pain seen by orthopedist 2011 at which time she received an injection of Xylocaine and Aristospan. History of aspiration right breast cyst May 2012.  Social history: Works for the town of [place]. She is married with 2 adult children. Her mother is also a patient here. She does not smoke or consume alcohol.  Family history: Father with history of CABG, diabetes, bloop syndrome, hyperlipidemia, hypertension died from respiratory failure. He had pulmonary fibrosis. Mother with history of Mnire's disease and remote history of melanoma.      Review of Systems  Constitutional: Negative.   All other systems reviewed and are negative.      Objective:   Physical Exam  Constitutional: She is oriented to person, place, and time. She appears well-developed and well-nourished. No distress.  HENT:  Head: Normocephalic and atraumatic.  Right Ear: External ear normal.  Left Ear: External ear normal.  Mouth/Throat: Oropharynx is clear and moist.  Eyes: Pupils are equal, round, and reactive to light. Conjunctivae and EOM are normal. Right eye exhibits no discharge. Left eye exhibits no discharge.  No scleral icterus.  Neck: Neck supple. No JVD present. No thyromegaly present.  Cardiovascular: Normal rate, regular rhythm, normal heart sounds and intact distal pulses.   No murmur heard. Pulmonary/Chest: Effort normal and breath sounds normal. She has no wheezes. She has no rales.  Breasts normal female  Abdominal: Soft. Bowel sounds are normal. She exhibits no distension. There is no tenderness. There is no rebound and no guarding.  Genitourinary:  Genitourinary Comments: Pap taken 2016. Bimanual normal.  Musculoskeletal: She exhibits no edema.  Lymphadenopathy:    She has no cervical adenopathy.  Neurological: She is alert and oriented to person, place, and time. She has normal reflexes. No cranial nerve deficit. Coordination normal.  Skin: Skin is warm and dry. No rash noted. She is not diaphoretic.  Psychiatric: She has a normal mood and affect. Her behavior is normal. Judgment and thought content normal.  Vitals reviewed.         Assessment & Plan:  Essential hypertension-blood pressure stable  History of fibrocystic breast disease  History of basal cell carcinoma  History of dysplastic nevus  Vitamin D deficiency-level was 29 and last year was 34. Take 2000 units vitamin D 3 daily  Hyperlipidemia-total cholesterol last year was 212 and is now 229. LDL cholesterol was 111 and is now 124. 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 Highest white count was 4120 12 and then the following year in 2013 dropped to 2600. However when  she was seen for flulike illness in 2013 White was cell count was 8500. Continue to monitor.

## 2016-12-27 NOTE — Patient Instructions (Signed)
Work on diet and exercise. Continue Protonix for GE reflux. Take 2000 units vitamin D 3 daily. Flu vaccine given. Have annual mammogram. Continue same antihypertensive medications. Return in 6 months for lipid panel and office visit

## 2017-02-01 ENCOUNTER — Other Ambulatory Visit: Payer: Self-pay | Admitting: Internal Medicine

## 2017-04-29 ENCOUNTER — Other Ambulatory Visit: Payer: Self-pay | Admitting: Internal Medicine

## 2017-05-18 ENCOUNTER — Other Ambulatory Visit: Payer: Self-pay | Admitting: Internal Medicine

## 2017-05-18 DIAGNOSIS — E785 Hyperlipidemia, unspecified: Secondary | ICD-10-CM

## 2017-05-18 DIAGNOSIS — I10 Essential (primary) hypertension: Secondary | ICD-10-CM

## 2017-05-18 DIAGNOSIS — Z139 Encounter for screening, unspecified: Secondary | ICD-10-CM

## 2017-06-12 ENCOUNTER — Other Ambulatory Visit: Payer: Self-pay | Admitting: Internal Medicine

## 2017-06-19 ENCOUNTER — Other Ambulatory Visit: Payer: PRIVATE HEALTH INSURANCE | Admitting: Internal Medicine

## 2017-06-19 DIAGNOSIS — I10 Essential (primary) hypertension: Secondary | ICD-10-CM

## 2017-06-19 DIAGNOSIS — E785 Hyperlipidemia, unspecified: Secondary | ICD-10-CM

## 2017-06-19 LAB — LIPID PANEL
CHOL/HDL RATIO: 2.9 (calc) (ref <5.0)
Cholesterol: 222 mg/dL — ABNORMAL HIGH (ref <200)
HDL: 77 mg/dL (ref >50)
LDL Cholesterol (Calc): 126 mg/dL (calc) — ABNORMAL HIGH
NON-HDL CHOLESTEROL (CALC): 145 mg/dL — AB (ref <130)
TRIGLYCERIDES: 88 mg/dL (ref <150)

## 2017-06-21 ENCOUNTER — Encounter: Payer: Self-pay | Admitting: Internal Medicine

## 2017-06-21 ENCOUNTER — Ambulatory Visit (INDEPENDENT_AMBULATORY_CARE_PROVIDER_SITE_OTHER): Payer: PRIVATE HEALTH INSURANCE | Admitting: Internal Medicine

## 2017-06-21 VITALS — BP 110/80 | HR 73 | Ht 64.0 in | Wt 138.0 lb

## 2017-06-21 DIAGNOSIS — E7800 Pure hypercholesterolemia, unspecified: Secondary | ICD-10-CM | POA: Diagnosis not present

## 2017-06-21 NOTE — Progress Notes (Signed)
   Subjective:    Patient ID: Tamara Myers, female    DOB: Sep 29, 1955, 62 y.o.   MRN: [id number]  HPI here in August for physical examination.  History of hyperlipidemia.  Currently not on statin medication.  Physical exam in August total cholesterol was 229 and LDL cholesterol was 124.  She is been watching her diet.  She has essential hypertension which is stable.  Lipid panel done on February 19 showed total cholesterol of 222, HDL cholesterol 77, triglycerides of 88 and LDL cholesterol of 126.  Review of Systems     Objective:   Physical Exam  Spent 15 minutes speaking with her about this matter of lipid control.  Family history of heart disease in her father.      Assessment & Plan:  Pure hypercholesterolemia  Plan: Crestor 5 mg daily and follow-up in May with lipid panel and liver functions

## 2017-06-23 MED ORDER — ROSUVASTATIN CALCIUM 5 MG PO TABS: 5.0000 mg | ORAL_TABLET | Freq: Every day | ORAL | 3 refills | Status: DC

## 2017-06-23 NOTE — Patient Instructions (Signed)
Crestor 5 mg daily and follow-up in May with lipid panel and liver functions.

## 2017-06-25 ENCOUNTER — Ambulatory Visit
Admission: RE | Admit: 2017-06-25 | Discharge: 2017-06-25 | Disposition: A | Discharge status: Home / Self Care | Payer: PRIVATE HEALTH INSURANCE | Source: Ambulatory Visit | Attending: Internal Medicine | Admitting: Internal Medicine

## 2017-06-25 DIAGNOSIS — Z139 Encounter for screening, unspecified: Secondary | ICD-10-CM

## 2017-07-29 ENCOUNTER — Other Ambulatory Visit: Payer: Self-pay | Admitting: Internal Medicine

## 2017-08-30 ENCOUNTER — Other Ambulatory Visit: Payer: Self-pay

## 2017-08-30 DIAGNOSIS — Z5181 Encounter for therapeutic drug level monitoring: Secondary | ICD-10-CM

## 2017-08-30 DIAGNOSIS — E7800 Pure hypercholesterolemia, unspecified: Secondary | ICD-10-CM

## 2017-08-30 DIAGNOSIS — I10 Essential (primary) hypertension: Secondary | ICD-10-CM

## 2017-08-30 DIAGNOSIS — Z79899 Other long term (current) drug therapy: Secondary | ICD-10-CM

## 2017-09-17 ENCOUNTER — Other Ambulatory Visit: Payer: PRIVATE HEALTH INSURANCE | Admitting: Internal Medicine

## 2017-09-17 DIAGNOSIS — E7800 Pure hypercholesterolemia, unspecified: Secondary | ICD-10-CM

## 2017-09-17 DIAGNOSIS — I10 Essential (primary) hypertension: Secondary | ICD-10-CM

## 2017-09-17 DIAGNOSIS — Z79899 Other long term (current) drug therapy: Secondary | ICD-10-CM

## 2017-09-17 DIAGNOSIS — Z5181 Encounter for therapeutic drug level monitoring: Secondary | ICD-10-CM

## 2017-09-17 LAB — HEPATIC FUNCTION PANEL
AG RATIO: 1.9 (calc) (ref 1.0–2.5)
ALBUMIN MSPROF: 4.4 g/dL (ref 3.6–5.1)
ALKALINE PHOSPHATASE (APISO): 47 U/L (ref 33–130)
ALT: 18 U/L (ref 6–29)
AST: 22 U/L (ref 10–35)
BILIRUBIN TOTAL: 0.8 mg/dL (ref 0.2–1.2)
Bilirubin, Direct: 0.2 mg/dL (ref 0.0–0.2)
Globulin: 2.3 g/dL (calc) (ref 1.9–3.7)
Indirect Bilirubin: 0.6 mg/dL (calc) (ref 0.2–1.2)
TOTAL PROTEIN: 6.7 g/dL (ref 6.1–8.1)

## 2017-09-17 LAB — LIPID PANEL
Cholesterol: 179 mg/dL (ref <200)
HDL: 79 mg/dL (ref >50)
LDL CHOLESTEROL (CALC): 85 mg/dL
NON-HDL CHOLESTEROL (CALC): 100 mg/dL (ref <130)
Total CHOL/HDL Ratio: 2.3 (calc) (ref <5.0)
Triglycerides: 66 mg/dL (ref <150)

## 2017-09-18 ENCOUNTER — Telehealth: Payer: Self-pay | Admitting: Internal Medicine

## 2017-09-18 ENCOUNTER — Encounter: Payer: Self-pay | Admitting: Internal Medicine

## 2017-09-18 NOTE — Telephone Encounter (Signed)
Please call pt and tell her lipid panel and liver functions are normal. See her in August for CPE. Keep taking Crestor.

## 2017-09-19 NOTE — Telephone Encounter (Signed)
Pt was notified of results and instructions, pt verbalized understanding.   

## 2017-10-24 ENCOUNTER — Other Ambulatory Visit: Payer: Self-pay | Admitting: Internal Medicine

## 2017-12-08 ENCOUNTER — Other Ambulatory Visit: Payer: Self-pay | Admitting: Internal Medicine

## 2017-12-18 ENCOUNTER — Other Ambulatory Visit: Payer: Self-pay | Admitting: Internal Medicine

## 2017-12-18 DIAGNOSIS — E559 Vitamin D deficiency, unspecified: Secondary | ICD-10-CM

## 2017-12-18 DIAGNOSIS — I10 Essential (primary) hypertension: Secondary | ICD-10-CM

## 2017-12-18 DIAGNOSIS — E7800 Pure hypercholesterolemia, unspecified: Secondary | ICD-10-CM

## 2017-12-18 DIAGNOSIS — Z0000 Encounter for general adult medical examination without abnormal findings: Secondary | ICD-10-CM

## 2017-12-18 DIAGNOSIS — K219 Gastro-esophageal reflux disease without esophagitis: Secondary | ICD-10-CM

## 2017-12-21 ENCOUNTER — Other Ambulatory Visit: Payer: PRIVATE HEALTH INSURANCE | Admitting: Internal Medicine

## 2017-12-21 DIAGNOSIS — E559 Vitamin D deficiency, unspecified: Secondary | ICD-10-CM

## 2017-12-21 DIAGNOSIS — E7800 Pure hypercholesterolemia, unspecified: Secondary | ICD-10-CM

## 2017-12-21 DIAGNOSIS — I10 Essential (primary) hypertension: Secondary | ICD-10-CM

## 2017-12-21 DIAGNOSIS — K219 Gastro-esophageal reflux disease without esophagitis: Secondary | ICD-10-CM

## 2017-12-21 DIAGNOSIS — Z0000 Encounter for general adult medical examination without abnormal findings: Secondary | ICD-10-CM

## 2017-12-22 LAB — CBC WITH DIFFERENTIAL/PLATELET
BASOS ABS: 22 {cells}/uL (ref 0–200)
BASOS PCT: 0.6 %
EOS ABS: 83 {cells}/uL (ref 15–500)
Eosinophils Relative: 2.3 %
HCT: 44.4 % (ref 35.0–45.0)
HEMOGLOBIN: 14.9 g/dL (ref 11.7–15.5)
Lymphs Abs: 508 cells/uL — ABNORMAL LOW (ref 850–3900)
MCH: 29.4 pg (ref 27.0–33.0)
MCHC: 33.6 g/dL (ref 32.0–36.0)
MCV: 87.6 fL (ref 80.0–100.0)
MONOS PCT: 9.9 %
MPV: 10.7 fL (ref 7.5–12.5)
NEUTROS ABS: 2632 {cells}/uL (ref 1500–7800)
Neutrophils Relative %: 73.1 %
Platelets: 263 10*3/uL (ref 140–400)
RBC: 5.07 10*6/uL (ref 3.80–5.10)
RDW: 11.8 % (ref 11.0–15.0)
Total Lymphocyte: 14.1 %
WBC mixed population: 356 cells/uL (ref 200–950)
WBC: 3.6 10*3/uL — ABNORMAL LOW (ref 3.8–10.8)

## 2017-12-22 LAB — COMPLETE METABOLIC PANEL WITH GFR
AG RATIO: 2.1 (calc) (ref 1.0–2.5)
ALT: 17 U/L (ref 6–29)
AST: 19 U/L (ref 10–35)
Albumin: 4.7 g/dL (ref 3.6–5.1)
Alkaline phosphatase (APISO): 50 U/L (ref 33–130)
BUN: 8 mg/dL (ref 7–25)
CALCIUM: 9.7 mg/dL (ref 8.6–10.4)
CO2: 28 mmol/L (ref 20–32)
CREATININE: 0.75 mg/dL (ref 0.50–0.99)
Chloride: 100 mmol/L (ref 98–110)
GFR, EST AFRICAN AMERICAN: 100 mL/min/{1.73_m2} (ref >=60)
GFR, EST NON AFRICAN AMERICAN: 86 mL/min/{1.73_m2} (ref >=60)
Globulin: 2.2 g/dL (calc) (ref 1.9–3.7)
Glucose, Bld: 91 mg/dL (ref 65–99)
POTASSIUM: 3.5 mmol/L (ref 3.5–5.3)
Sodium: 140 mmol/L (ref 135–146)
TOTAL PROTEIN: 6.9 g/dL (ref 6.1–8.1)
Total Bilirubin: 0.7 mg/dL (ref 0.2–1.2)

## 2017-12-22 LAB — LIPID PANEL
CHOL/HDL RATIO: 2.3 (calc) (ref <5.0)
CHOLESTEROL: 186 mg/dL (ref <200)
HDL: 81 mg/dL (ref >50)
LDL Cholesterol (Calc): 88 mg/dL (calc)
NON-HDL CHOLESTEROL (CALC): 105 mg/dL (ref <130)
Triglycerides: 83 mg/dL (ref <150)

## 2017-12-22 LAB — TSH: TSH: 0.75 mIU/L (ref 0.40–4.50)

## 2017-12-22 LAB — VITAMIN D 25 HYDROXY (VIT D DEFICIENCY, FRACTURES): Vit D, 25-Hydroxy: 34 ng/mL (ref 30–100)

## 2017-12-25 ENCOUNTER — Ambulatory Visit (INDEPENDENT_AMBULATORY_CARE_PROVIDER_SITE_OTHER): Payer: PRIVATE HEALTH INSURANCE | Admitting: Internal Medicine

## 2017-12-25 ENCOUNTER — Other Ambulatory Visit (HOSPITAL_COMMUNITY)
Admission: RE | Admit: 2017-12-25 | Discharge: 2017-12-25 | Disposition: A | Discharge status: Home / Self Care | Payer: PRIVATE HEALTH INSURANCE | Source: Ambulatory Visit | Attending: Internal Medicine | Admitting: Internal Medicine

## 2017-12-25 ENCOUNTER — Encounter: Payer: Self-pay | Admitting: Internal Medicine

## 2017-12-25 VITALS — BP 120/80 | HR 75 | Ht 63.75 in | Wt 130.0 lb

## 2017-12-25 DIAGNOSIS — Z124 Encounter for screening for malignant neoplasm of cervix: Secondary | ICD-10-CM | POA: Diagnosis not present

## 2017-12-25 DIAGNOSIS — D708 Other neutropenia: Secondary | ICD-10-CM | POA: Diagnosis not present

## 2017-12-25 DIAGNOSIS — K219 Gastro-esophageal reflux disease without esophagitis: Secondary | ICD-10-CM

## 2017-12-25 DIAGNOSIS — E7800 Pure hypercholesterolemia, unspecified: Secondary | ICD-10-CM | POA: Diagnosis not present

## 2017-12-25 DIAGNOSIS — I10 Essential (primary) hypertension: Secondary | ICD-10-CM | POA: Diagnosis not present

## 2017-12-25 DIAGNOSIS — Z23 Encounter for immunization: Secondary | ICD-10-CM | POA: Diagnosis not present

## 2017-12-25 DIAGNOSIS — Z0000 Encounter for general adult medical examination without abnormal findings: Secondary | ICD-10-CM | POA: Diagnosis not present

## 2017-12-25 LAB — POCT URINALYSIS DIPSTICK
APPEARANCE: NORMAL
Bilirubin, UA: NEGATIVE
Blood, UA: NEGATIVE
GLUCOSE UA: NEGATIVE
Ketones, UA: NEGATIVE
LEUKOCYTES UA: NEGATIVE
Nitrite, UA: NEGATIVE
ODOR: NORMAL
Protein, UA: NEGATIVE
Spec Grav, UA: 1.01 (ref 1.010–1.025)
Urobilinogen, UA: 0.2 E.U./dL
pH, UA: 6.5 (ref 5.0–8.0)

## 2017-12-25 NOTE — Progress Notes (Signed)
   Subjective:    Patient ID: Tamara Myers, female    DOB: December 15, 1955, 62 y.o.   MRN: [id number]  HPI 62 year old Female in today for health maintenance exam and evaluation of medical issues.  She has a history of hyperlipidemia and vitamin D deficiency.  Open reduction internal fixation right thumb July 2016 by Dr. Apolonio Schneiders.  This was due to a right thumb metacarpal shaft fracture.  History of essential hypertension and fibrocystic breast disease.  No known drug allergies.  History of dysplastic nevus right leg treated by Dr. Sarajane Jews, dermatologist.  Atypical nevus removed from arm and a basal cell carcinoma removed in the past as well.  Became menopausal in 2000.  Patient had 2 C-sections in the past.  Fractured right fifth metacarpal.  Fractured left fifth finger at age 31.  History of right knee pain seen orthopedist 2011 at which time she received an injection of Xylocaine and Aristospan.  History of aspiration right wrist is May 2012.  Social history: She works for the [employer].  She is married with 2 adult children.  Her mother is also a patient here and has macular degeneration.  Patient has concerns about her continuing to drive which was discussed at length today.  Patient does not smoke or consume alcohol.  Family history: Father with history of CABG, diabetes, BOOP syndrome, hyperlipidemia, hypertension died from respiratory failure.  He had pulmonary fibrosis.  Mother with history of Mnire's disease, macular degeneration and remote history of melanoma.    Review of Systems  Constitutional: Negative.   All other systems reviewed and are negative.      Objective:   Physical Exam  Constitutional: She is oriented to person, place, and time. She appears well-developed and well-nourished. No distress.  HENT:  Head: Normocephalic and atraumatic.  Right Ear: External ear normal.  Left Ear: External ear normal.  Mouth/Throat: Oropharynx is clear and moist. No  oropharyngeal exudate.  Eyes: Pupils are equal, round, and reactive to light. EOM are normal. Right eye exhibits no discharge. Left eye exhibits no discharge.  Neck: Neck supple. No JVD present. No thyromegaly present.  Cardiovascular: Normal rate, regular rhythm and normal heart sounds. Exam reveals no friction rub.  No murmur heard. Pulmonary/Chest: Effort normal and breath sounds normal. No stridor. No respiratory distress. She has no wheezes. She has no rales.  Abdominal: Soft. Bowel sounds are normal. She exhibits no distension and no mass. There is no tenderness. There is no rebound and no guarding.  Genitourinary:  Genitourinary Comments: Pap taken.  Bimanual normal.  Musculoskeletal: She exhibits no edema.  Lymphadenopathy:    She has no cervical adenopathy.  Neurological: She is alert and oriented to person, place, and time.  Skin: Skin is warm and dry. She is not diaphoretic. No erythema. No pallor.  Psychiatric: She has a normal mood and affect. Her behavior is normal. Judgment and thought content normal.  Vitals reviewed.         Assessment & Plan:  GE reflux stable on PPI  Hyperlipidemia-lipid panel normal on Crestor 5 mg daily  Essential hypertension stable on amlodipine and Maxide 25  Health maintenance-flu vaccine given  History of leukopenia-stable  Plan: Asked patient return in 6 months for lipid panel liver functions and blood pressure check.

## 2017-12-26 NOTE — Patient Instructions (Signed)
It was a pleasure to see you today.  Lipid panel is normal on low-dose Crestor.  Continue same medications and follow-up in 6 months.  Flu vaccine given.

## 2017-12-28 ENCOUNTER — Telehealth: Payer: Self-pay | Admitting: Emergency Medicine

## 2017-12-28 LAB — CYTOLOGY - PAP
DIAGNOSIS: NEGATIVE
HPV (WINDOPATH): NOT DETECTED

## 2017-12-28 NOTE — Telephone Encounter (Signed)
Pt called and stated Dr Renold Genta wanted her to see an allergist. She has called and set up an appt with Dr Neldon Mc she this who is with [facility]. He insurance is requiring a referral and wants to know if one can be put in. Thanks.

## 2017-12-28 NOTE — Telephone Encounter (Signed)
Dr. Neldon Mc is with [facility]. Please put in referral

## 2018-01-23 ENCOUNTER — Ambulatory Visit
Admission: RE | Admit: 2018-01-23 | Discharge: 2018-01-23 | Disposition: A | Discharge status: Home / Self Care | Payer: PRIVATE HEALTH INSURANCE | Source: Ambulatory Visit | Attending: Allergy and Immunology | Admitting: Allergy and Immunology

## 2018-01-23 ENCOUNTER — Encounter: Payer: Self-pay | Admitting: Allergy and Immunology

## 2018-01-23 ENCOUNTER — Ambulatory Visit: Payer: PRIVATE HEALTH INSURANCE | Admitting: Allergy and Immunology

## 2018-01-23 VITALS — BP 140/80 | HR 78 | Temp 98.0°F | Resp 16 | Ht 64.0 in | Wt 133.8 lb

## 2018-01-23 DIAGNOSIS — K219 Gastro-esophageal reflux disease without esophagitis: Secondary | ICD-10-CM | POA: Diagnosis not present

## 2018-01-23 DIAGNOSIS — J3089 Other allergic rhinitis: Secondary | ICD-10-CM

## 2018-01-23 DIAGNOSIS — R05 Cough: Secondary | ICD-10-CM | POA: Diagnosis not present

## 2018-01-23 DIAGNOSIS — R059 Cough, unspecified: Secondary | ICD-10-CM

## 2018-01-23 MED ORDER — PANTOPRAZOLE SODIUM 40 MG PO TBEC: 40.0000 mg | DELAYED_RELEASE_TABLET | Freq: Two times a day (BID) | ORAL | 1 refills | Status: DC

## 2018-01-23 MED ORDER — MONTELUKAST SODIUM 10 MG PO TABS: 10.0000 mg | ORAL_TABLET | Freq: Every day | ORAL | 5 refills | Status: DC

## 2018-01-23 MED ORDER — RANITIDINE HCL 300 MG PO CAPS: 300.0000 mg | ORAL_CAPSULE | Freq: Every evening | ORAL | 5 refills | Status: DC

## 2018-01-23 NOTE — Patient Instructions (Addendum)
  1.  Allergen avoidance measures?  2.  Treat and prevent reflux:   A.  Eliminate all forms of caffeine consumption including chocolate  B.  Increase pantoprazole 40 mg twice a day  C.  Start ranitidine 300 mg in evening  3.  Treat and prevent inflammation:   A.  Montelukast 10 mg tablet 1 time per day  B.  OTC Nasacort 1 spray each nostril once a day  4.  Obtain a chest x-ray for cough  5.  Return to clinic in 3 weeks or earlier if problem  6.  Obtain a flu vaccine this fall

## 2018-01-23 NOTE — Progress Notes (Signed)
Dear Dr. Renold Genta,  Thank you for referring Tamara Myers to the [facility] on 01/23/2018.   Below is a summation of this patient's evaluation and recommendations.  Thank you for your referral. I will keep you informed about this patient's response to treatment.   If you have any questions please do not hesitate to contact me.   Sincerely,  Jiles Prows, MD Allergy / [facility]   ______________________________________________________________________    NEW PATIENT NOTE  Referring Provider: Elby Showers, MD Primary Provider: Elby Showers, MD Date of office visit: 01/23/2018    Subjective:   Chief Complaint:  Tamara Myers (DOB: January 09, 1956) is a 62 y.o. female who presents to the clinic on 01/23/2018 with a chief complaint of Cough (6-8 months ) .     HPI: Tamara Myers presents to this clinic in evaluation of cough.  Apparently 6 to 8 months ago she developed a cough.  Associated with this cough is throat clearing and a sensation of a lump or glob in her throat with minimal amounts of raspy voice.  She apparently coughs both during the daytime and nighttime.  She does not make any sputum production nor have any chest tightness or shortness of breath or limitation in exercise.  She has minimal upper airway symptoms other than some occasional runny nose.  She can smell without any difficulty and does not have any ugly nasal discharge or headaches.  She does not have any symptoms suggesting reflux.  She does eat chocolate several times per week but does not consume other forms of caffeine.  Empiric therapy with for reflux with the use of pantoprazole 40 mg 1 time per day has not helped this issue.  Past Medical History:  Diagnosis Date  . Cancer ([facility] [place])    basal cell carcinoma  . Complication of anesthesia    slow to wake up  . Hypertension   . Post menopausal syndrome      Past Surgical History:  Procedure Laterality Date  . CESAREAN SECTION    . OPEN REDUCTION INTERNAL FIXATION (ORIF) METACARPAL Right 11/24/2014   Procedure: OPEN REDUCTION INTERNAL FIXATION (ORIF) RIGHT THUMB;  Surgeon: Iran Planas, MD;  Location: [facility];  Service: Orthopedics;  Laterality: Right;  . thumb surgery Right 2014    Allergies as of 01/23/2018   No Known Allergies     Medication List      amLODipine 5 MG tablet Commonly known as:  NORVASC TAKE 1 TABLET BY MOUTH EVERY DAY   docusate sodium 100 MG capsule Commonly known as:  COLACE Take 1 capsule (100 mg total) by mouth 2 (two) times daily.   pantoprazole 40 MG tablet Commonly known as:  PROTONIX TAKE 1 TABLET BY MOUTH EVERY DAY   rosuvastatin 5 MG tablet Commonly known as:  CRESTOR Take 1 tablet (5 mg total) by mouth daily.   triamterene-hydrochlorothiazide 37.5-25 MG tablet Commonly known as:  MAXZIDE-25 TAKE 1 TABLET BY MOUTH EVERY DAY   Vitamin D 2000 units Caps Take by mouth.       Review of systems negative except as noted in HPI / PMHx or noted below:  Review of Systems  Constitutional: Negative.   HENT: Negative.   Eyes: Negative.   Respiratory: Negative.   Cardiovascular: Negative.   Gastrointestinal: Negative.   Genitourinary: Negative.   Musculoskeletal: Negative.   Skin:  Removal of melanoma and basal cell carcinoma  Neurological: Negative.   Endo/Heme/Allergies: Negative.   Psychiatric/Behavioral: Negative.     Family History  Problem Relation Age of Onset  . Diabetes Father   . Heart disease Father   . Hyperlipidemia Father   . Hypertension Father   . Asthma Daughter   . Breast cancer Neg Hx   . Allergic rhinitis Neg Hx   . Eczema Neg Hx   . Urticaria Neg Hx   . Angioedema Neg Hx     Social History   Socioeconomic History  . Marital status: Married    Spouse name: Not on file  . Number of children: Not on file  . Years of education: Not on file  . Highest  education level: Not on file  Occupational History  . Not on file  Social Needs  . Financial resource strain: Not on file  . Food insecurity:    Worry: Not on file    Inability: Not on file  . Transportation needs:    Medical: Not on file    Non-medical: Not on file  Tobacco Use  . Smoking status: Never Smoker  . Smokeless tobacco: Never Used  Substance and Sexual Activity  . Alcohol use: No  . Drug use: No  . Sexual activity: Not on file  Lifestyle  . Physical activity:    Days per week: Not on file    Minutes per session: Not on file  . Stress: Not on file  Relationships  . Social connections:    Talks on phone: Not on file    Gets together: Not on file    Attends religious service: Not on file    Active member of club or organization: Not on file    Attends meetings of clubs or organizations: Not on file    Relationship status: Not on file  . Intimate partner violence:    Fear of current or ex partner: Not on file    Emotionally abused: Not on file    Physically abused: Not on file    Forced sexual activity: Not on file  Other Topics Concern  . Not on file  Social History Narrative  . Not on file    Environmental and Social history  Lives in a house with a dry environment, a turtle located inside the household, carpet in the bedroom, no plastic on the bed, no plastic on the pillow, and no smokers located inside the household.  She works in an office setting.  Objective:   Vitals:   01/23/18 0837  BP: 140/80  Pulse: 78  Resp: 16  Temp: 98 F (36.7 C)  SpO2: 98%   Height: 5\' 4"  (162.6 cm) Weight: 133 lb 12.8 oz (60.7 kg)  Physical Exam  HENT:  Head: Normocephalic. Head is without right periorbital erythema and without left periorbital erythema.  Right Ear: Tympanic membrane, external ear and ear canal normal.  Left Ear: Tympanic membrane, external ear and ear canal normal.  Nose: Nose normal. No mucosal edema or rhinorrhea.  Mouth/Throat: Uvula is  midline, oropharynx is clear and moist and mucous membranes are normal. No oropharyngeal exudate.  Eyes: Pupils are equal, round, and reactive to light. Conjunctivae and lids are normal.  Neck: Trachea normal. No tracheal tenderness present. No tracheal deviation present. No thyromegaly present.  Cardiovascular: Normal rate, regular rhythm, S1 normal, S2 normal and normal heart sounds.  No murmur heard. Pulmonary/Chest: Effort normal and breath sounds normal. No stridor. No respiratory  distress. She has no wheezes. She has no rales. She exhibits no tenderness.  Abdominal: Soft. She exhibits no distension and no mass. There is no hepatosplenomegaly. There is no tenderness. There is no rebound and no guarding.  Musculoskeletal: She exhibits no edema or tenderness.  Lymphadenopathy:       Head (right side): No tonsillar adenopathy present.       Head (left side): No tonsillar adenopathy present.    She has no cervical adenopathy.    She has no axillary adenopathy.  Neurological: She is alert.  Skin: No rash noted. She is not diaphoretic. No erythema. No pallor. Nails show no clubbing.    Diagnostics: Allergy skin tests were performed.  She did not demonstrate any hypersensitivity against a screening panel of aeroallergens.  Spirometry was performed and demonstrated an FEV1 of 1.46 @ 57 % of predicted. FEV1/FVC = 0.82.  Following administration of nebulized albuterol her FEV1 rose to 2.19 which was an increase in the FEV1 of 50%.  However, it should be noted that she had some difficulty performing the spirometric maneuver.  Results of blood tests obtained 21 December 2017 identifies WBC 3.6, absolute eosinophil 83, absolute lymphocyte 508, hemoglobin 14.9, platelet 263.  Results of blood tests obtained 19 December 2016 identifies WBC 3.1, absolute eosinophil 93, absolute lymphocyte 682, hemoglobin 14.7, platelet 252   Assessment and Plan:    1. Cough   2. LPRD (laryngopharyngeal reflux disease)    3. Other allergic rhinitis     1.  Allergen avoidance measures?  2.  Treat and prevent reflux:   A.  Eliminate all forms of caffeine consumption including chocolate  B.  Increase pantoprazole 40 mg twice a day  C.  Start ranitidine 300 mg in evening  3.  Treat and prevent inflammation:   A.  Montelukast 10 mg tablet 1 time per day  B.  OTC Nasacort 1 spray each nostril once a day  4.  Obtain a chest x-ray for cough  5.  Return to clinic in 3 weeks or earlier if problem  6.  Obtain a flu vaccine this fall  The cause of Tamara Myers's cough is not entirely clear.  There may be a component of reflux induced respiratory disease and we will treat her aggressively for this issue as noted above and I have given her some upper airway anti-inflammatory medications to address any upper airway inflammation.  I think she does warrant a chest x-ray and I will contact her with the results of that study once it is available for review.  I will see her back in this clinic in 3 weeks or earlier if there is a problem.  Jiles Prows, MD Allergy / [facility]

## 2018-01-24 ENCOUNTER — Encounter: Payer: Self-pay | Admitting: Allergy and Immunology

## 2018-01-30 NOTE — Telephone Encounter (Signed)
Allergy was put in and patient has appointment w/[facility] on 02/13/18.

## 2018-01-31 ENCOUNTER — Encounter: Payer: Self-pay | Admitting: Internal Medicine

## 2018-02-13 ENCOUNTER — Encounter: Payer: Self-pay | Admitting: Allergy

## 2018-02-13 ENCOUNTER — Ambulatory Visit: Payer: PRIVATE HEALTH INSURANCE | Admitting: Allergy

## 2018-02-13 VITALS — BP 132/82 | HR 66 | Temp 97.7°F | Resp 16 | Ht 63.5 in | Wt 132.2 lb

## 2018-02-13 DIAGNOSIS — J189 Pneumonia, unspecified organism: Secondary | ICD-10-CM

## 2018-02-13 DIAGNOSIS — R059 Cough, unspecified: Secondary | ICD-10-CM | POA: Insufficient documentation

## 2018-02-13 DIAGNOSIS — R05 Cough: Secondary | ICD-10-CM

## 2018-02-13 MED ORDER — DOXYCYCLINE HYCLATE 100 MG PO CAPS: 100.0000 mg | ORAL_CAPSULE | Freq: Two times a day (BID) | ORAL | 0 refills | Status: DC

## 2018-02-13 NOTE — Patient Instructions (Addendum)
Cough Past history - coughing for 8 months. Negative skin testing in 2019. Questionable improvement in FEV1 post bronchodilator treatment at last visit which could have been due to technique.  Interim history - no improvement with Protonix 40mg  twice a day, zantac 300mg  daily, Nasacort nasal spray and Singulair and eliminating caffeine from diet. CXR showed atypical pneumonia. Denies any fevers or productive cough. This cough was never treated with antibiotics or prednisone.   Given no benefit with antacid medications and nasal spray, doubtful that the cough is due to reflux or post nasal drip but question if there's an infectious component due to CXR result.  Start prednisone taper.  Start doxycycline 100mg  twice a day for 10 days.  Stop Nasacort, Singulair and zantac as not effective.  Decrease Protonix to 40mg  daily.  Get CXR in 3 weeks.  Repeat spirometry at next visit.  Atypical pneumonia See above for cough.  Return in about 3 weeks (around 03/06/2018).

## 2018-02-13 NOTE — Assessment & Plan Note (Addendum)
Past history - coughing for 8 months. Negative skin testing in 2019. Questionable improvement in FEV1 post bronchodilator treatment at last visit which could have been due to technique.  Interim history - no improvement with Protonix 40mg  twice a day, zantac 300mg  daily, Nasacort nasal spray and Singulair and eliminating caffeine from diet. CXR showed atypical pneumonia. Denies any fevers or productive cough. This cough was never treated with antibiotics or prednisone.   Given no benefit with antacid medications and nasal spray, doubtful that the cough is due to reflux or post nasal drip but question if there's an infectious component due to CXR result.  Start prednisone taper.  Start doxycycline 100mg  twice a day for 10 days.  Stop Nasacort, Singulair and zantac as not effective.  Decrease Protonix to 40mg  daily.  Get CXR in 3 weeks.  Repeat spirometry at next visit.

## 2018-02-13 NOTE — Assessment & Plan Note (Signed)
See above for cough.

## 2018-02-13 NOTE — Progress Notes (Signed)
Follow Up Note  RE: CORTLYNN HOLLINSWORTH MRN: [id number] DOB: 20-Jan-1956 Date of Office Visit: 02/13/2018  Referring provider: Elby Showers, MD Primary care provider: Elby Showers, MD  Chief Complaint: Cough  History of Present Illness: I had the pleasure of seeing Thedora Aderhold for a follow up visit at the [facility] [place] on 02/13/2018. She is a 62 y.o. female, who is being followed for coughing. Today she is here for regular follow up visit.Her previous allergy office visit was on 01/23/2018 with Dr. Neldon Mc.   Cough: Patient's cough is about the same. No mucous production. Currently on Protonix 40mg  BID, zantac 300mg  QHS, Singulair daily, Nasacort 1 spray daily with no benefit.  She has been also avoiding caffeine with no benefit.  CXR showed ? Atypical pneumonia.  Coughing for the past 6-8 months. Denies any infection or fevers. Not treated with antibiotics or prednisone previously.   Assessment and Plan: Eliani is a 62 y.o. female with: Cough Past history - coughing for 8 months. Negative skin testing in 2019. Questionable improvement in FEV1 post bronchodilator treatment at last visit which could have been due to technique.  Interim history - no improvement with Protonix 40mg  twice a day, zantac 300mg  daily, Nasacort nasal spray and Singulair and eliminating caffeine from diet. CXR showed atypical pneumonia. Denies any fevers or productive cough. This cough was never treated with antibiotics or prednisone.   Given no benefit with antacid medications and nasal spray, doubtful that the cough is due to reflux or post nasal drip but question if there's an infectious component due to CXR result.  Start prednisone taper.  Start doxycycline 100mg  twice a day for 10 days.  Stop Nasacort, Singulair and zantac as not effective.  Decrease Protonix to 40mg  daily.  Get CXR in 3 weeks.  Repeat spirometry at next visit.  Atypical pneumonia See above for cough.  Return  in about 3 weeks (around 03/06/2018).  Meds ordered this encounter  Medications  . doxycycline (VIBRAMYCIN) 100 MG capsule    Sig: Take 1 capsule (100 mg total) by mouth 2 (two) times daily.    Dispense:  20 capsule    Refill:  0   Diagnostics: CXR 01/23/2018: IMPRESSION: Bilateral perihilar airspace disease, presumably atypical pneumonia in this setting. Followup PA and lateral chest X-ray is recommended in 3-4 weeks following trial of antibiotic therapy to ensure resolution.  Medication List:  Current Outpatient Medications  Medication Sig Dispense Refill  . amLODipine (NORVASC) 5 MG tablet TAKE 1 TABLET BY MOUTH EVERY DAY 90 tablet 3  . Cholecalciferol (VITAMIN D) 2000 UNITS CAPS Take by mouth.    . docusate sodium (COLACE) 100 MG capsule Take 1 capsule (100 mg total) by mouth 2 (two) times daily. 30 capsule 0  . pantoprazole (PROTONIX) 40 MG tablet Take 1 tablet (40 mg total) by mouth 2 (two) times daily. 180 tablet 1  . rosuvastatin (CRESTOR) 5 MG tablet Take 1 tablet (5 mg total) by mouth daily. 90 tablet 3  . triamterene-hydrochlorothiazide (MAXZIDE-25) 37.5-25 MG tablet TAKE 1 TABLET BY MOUTH EVERY DAY 30 tablet 5  . doxycycline (VIBRAMYCIN) 100 MG capsule Take 1 capsule (100 mg total) by mouth 2 (two) times daily. 20 capsule 0   No current facility-administered medications for this visit.    Allergies: No Known Allergies I reviewed her past medical history, social history, family history, and environmental history and no significant changes have been reported from previous visit on 01/23/2018.  Review of Systems  Constitutional: Negative for appetite change, chills, fever and unexpected weight change.  HENT: Positive for postnasal drip. Negative for congestion and rhinorrhea.   Eyes: Negative for itching.  Respiratory: Positive for cough. Negative for chest tightness, shortness of breath and wheezing.   Gastrointestinal: Negative for abdominal pain.  Skin: Negative for  rash.  Neurological: Negative for headaches.   Objective: BP 132/82 (BP Location: Left Arm, Patient Position: Sitting, Cuff Size: Normal)   Pulse 66   Temp 97.7 F (36.5 C) (Oral)   Resp 16   Ht 5' 3.5" (1.613 m)   Wt 132 lb 3.2 oz (60 kg)   SpO2 97%   BMI 23.05 kg/m  Body mass index is 23.05 kg/m. Physical Exam  Constitutional: She is oriented to person, place, and time. She appears well-developed and well-nourished.  HENT:  Head: Normocephalic and atraumatic.  Right Ear: External ear normal.  Left Ear: External ear normal.  Nose: Nose normal.  Mouth/Throat: Oropharynx is clear and moist.  Eyes: Conjunctivae and EOM are normal.  Neck: Neck supple.  Cardiovascular: Normal rate, regular rhythm and normal heart sounds. Exam reveals no gallop and no friction rub.  No murmur heard. Pulmonary/Chest: Effort normal and breath sounds normal. She has no wheezes. She has no rales.  Lymphadenopathy:    She has no cervical adenopathy.  Neurological: She is alert and oriented to person, place, and time.  Skin: Skin is warm. No rash noted.  Psychiatric: She has a normal mood and affect. Her behavior is normal.  Nursing note and vitals reviewed.  Previous notes and tests were reviewed. The plan was reviewed with the patient/family, and all questions/concerned were addressed.  It was my pleasure to see Lashaun today and participate in her care. Please feel free to contact me with any questions or concerns.  Sincerely,  Rexene Alberts, DO [facility] [place]

## 2018-03-04 ENCOUNTER — Ambulatory Visit
Admission: RE | Admit: 2018-03-04 | Discharge: 2018-03-04 | Disposition: A | Discharge status: Home / Self Care | Payer: PRIVATE HEALTH INSURANCE | Source: Ambulatory Visit | Attending: Allergy | Admitting: Allergy

## 2018-03-06 ENCOUNTER — Ambulatory Visit: Payer: PRIVATE HEALTH INSURANCE | Admitting: Allergy

## 2018-03-06 ENCOUNTER — Encounter: Payer: Self-pay | Admitting: Allergy

## 2018-03-06 VITALS — BP 124/88 | HR 70 | Resp 16

## 2018-03-06 DIAGNOSIS — R05 Cough: Secondary | ICD-10-CM

## 2018-03-06 DIAGNOSIS — R059 Cough, unspecified: Secondary | ICD-10-CM

## 2018-03-06 DIAGNOSIS — J4530 Mild persistent asthma, uncomplicated: Secondary | ICD-10-CM

## 2018-03-06 DIAGNOSIS — J45909 Unspecified asthma, uncomplicated: Secondary | ICD-10-CM | POA: Insufficient documentation

## 2018-03-06 MED ORDER — BUDESONIDE-FORMOTEROL FUMARATE 80-4.5 MCG/ACT IN AERO: 2.0000 | INHALATION_SPRAY | Freq: Two times a day (BID) | RESPIRATORY_TRACT | 5 refills | Status: DC

## 2018-03-06 NOTE — Assessment & Plan Note (Addendum)
Past history - coughing for 8 months. Negative skin testing in 2019. Questionable improvement in FEV1 post bronchodilator treatment at last visit which could have been due to technique. No improvement with Protonix 40mg  twice a day, zantac 300mg  daily, Nasacort nasal spray and Singulair and eliminating caffeine from diet. CXR showed atypical pneumonia.  Interim history - improved with doxycycline and prednisone but coughing returning.  Given clinical history concern for some component of reactive airway disease contributing to the coughing.   Today's spirometry showed mixed restriction and obstruction.  Repeat CXR improved.  Continue Protonix to 40mg  daily.  Start pepcid 20mg  daily.  Start Symbicort 80 2 puffs twice a day with spacer and rinse mouth afterwards. Demonstrated proper use.   Repeat spirometry at next visit.  Monitor symptoms. If not improved may warrant getting CT chest as well.

## 2018-03-06 NOTE — Assessment & Plan Note (Signed)
See plan as above for cough.

## 2018-03-06 NOTE — Progress Notes (Signed)
Follow Up Note  RE: Tamara Myers MRN: [id number] DOB: [birth year] Date of Office Visit: 03/06/2018  Referring provider: Elby Showers, MD Primary care provider: Elby Showers, MD  Chief Complaint: Follow-up and Cough  History of Present Illness: I had the pleasure of seeing Tamara Myers for a follow up visit at the [facility] on 03/06/2018. She is a 62 y.o. female, who is being followed for coughing. Today she is here for regular follow up visit. Her previous allergy office visit was on 02/13/2018 with Dr. Maudie Mercury.   Cough She took doxycycline and prednisone and the coughing has improved however the last 4 days she noticed that the coughing is coming back. Last week she was traveling via plane and is worried if she picked up an URI that's causing the coughing.   She stopped zantac but felt the lump in the back of her throat so she restarted the zantac which helped that sensation but now store does not have it. Still taking Protonix.   Assessment and Plan: Tamara Myers is a 62 y.o. female with: Cough Past history - coughing for 8 months. Negative skin testing in 2019. Questionable improvement in FEV1 post bronchodilator treatment at last visit which could have been due to technique. No improvement with Protonix 40mg  twice a day, zantac 300mg  daily, Nasacort nasal spray and Singulair and eliminating caffeine from diet. CXR showed atypical pneumonia.  Interim history - improved with doxycycline and prednisone but coughing returning.  Given clinical history concern for some component of reactive airway disease contributing to the coughing.   Today's spirometry showed mixed restriction and obstruction.  Repeat CXR improved.  Continue Protonix to 40mg  daily.  Start pepcid 20mg  daily.  Start Symbicort 80 2 puffs twice a day with spacer and rinse mouth afterwards. Demonstrated proper use.   Repeat spirometry at next visit.  Monitor symptoms. If not improved may warrant  getting CT chest as well.   Reactive airway disease See plan as above for cough.  Return in about 4 weeks (around 04/03/2018).  Meds ordered this encounter  Medications  . budesonide-formoterol (SYMBICORT) 80-4.5 MCG/ACT inhaler    Sig: Inhale 2 puffs into the lungs 2 (two) times daily.    Dispense:  1 Inhaler    Refill:  5   Diagnostics: Spirometry:  Tracings reviewed. Her effort: Good reproducible efforts. FVC: 2.29L FEV1: 1.12L, 44% predicted FEV1/FVC ratio: 49% Interpretation: mixed restriction and obstruction.  Please see scanned spirometry results for details.  03/04/2018 CXR: IMPRESSION: Stable chest radiograph with nonspecific mild prominence of the central interstitial markings. No acute consolidative airspace disease to suggest a pneumonia. If there is clinical concern for interstitial lung disease, high-resolution chest CT could be obtained for further evaluation.  Medication List:  Current Outpatient Medications  Medication Sig Dispense Refill  . amLODipine (NORVASC) 5 MG tablet TAKE 1 TABLET BY MOUTH EVERY DAY 90 tablet 3  . Cholecalciferol (VITAMIN D) 2000 UNITS CAPS Take by mouth.    . pantoprazole (PROTONIX) 40 MG tablet Take 1 tablet (40 mg total) by mouth 2 (two) times daily. 180 tablet 1  . rosuvastatin (CRESTOR) 5 MG tablet Take 1 tablet (5 mg total) by mouth daily. 90 tablet 3  . triamterene-hydrochlorothiazide (MAXZIDE-25) 37.5-25 MG tablet TAKE 1 TABLET BY MOUTH EVERY DAY 30 tablet 5  . budesonide-formoterol (SYMBICORT) 80-4.5 MCG/ACT inhaler Inhale 2 puffs into the lungs 2 (two) times daily. 1 Inhaler 5   No current facility-administered medications for  this visit.    Allergies: No Known Allergies I reviewed her past medical history, social history, family history, and environmental history and no significant changes have been reported from previous visit on 02/13/2018.  Review of Systems  Constitutional: Negative for appetite change, chills,  fever and unexpected weight change.  HENT: Negative for congestion, postnasal drip and rhinorrhea.   Eyes: Negative for itching.  Respiratory: Positive for cough. Negative for chest tightness, shortness of breath and wheezing.   Gastrointestinal: Negative for abdominal pain.  Skin: Negative for rash.  Neurological: Negative for headaches.   Objective: BP 124/88 (BP Location: Left Arm, Patient Position: Sitting, Cuff Size: Normal)   Pulse 70   Resp 16   SpO2 96%  There is no height or weight on file to calculate BMI. Physical Exam  Constitutional: She is oriented to person, place, and time. She appears well-developed and well-nourished.  HENT:  Head: Normocephalic and atraumatic.  Right Ear: External ear normal.  Left Ear: External ear normal.  Nose: Nose normal.  Mouth/Throat: Oropharynx is clear and moist.  Eyes: Conjunctivae and EOM are normal.  Neck: Neck supple.  Cardiovascular: Normal rate, regular rhythm and normal heart sounds. Exam reveals no gallop and no friction rub.  No murmur heard. Pulmonary/Chest: Effort normal and breath sounds normal. She has no wheezes. She has no rales.  Neurological: She is alert and oriented to person, place, and time.  Skin: Skin is warm. No rash noted.  Psychiatric: She has a normal mood and affect. Her behavior is normal.  Nursing note and vitals reviewed.  Previous notes and tests were reviewed. The plan was reviewed with the patient/family, and all questions/concerned were addressed.  It was my pleasure to see Brae today and participate in her care. Please feel free to contact me with any questions or concerns.  Sincerely,  Rexene Alberts, DO Allergy & Immunology  [facility] office: [phone] [place]

## 2018-03-06 NOTE — Patient Instructions (Addendum)
Cough Past history - coughing for 8 months. Negative skin testing in 2019. Questionable improvement in FEV1 post bronchodilator treatment at last visit which could have been due to technique. No improvement with Protonix 40mg  twice a day, zantac 300mg  daily, Nasacort nasal spray and Singulair and eliminating caffeine from diet. CXR showed atypical pneumonia.  Interim history - improved with doxycycline and prednisone but coughing returning.  Given clinical history concern for some component of reactive airway disease contributing to the coughing.   Today's spirometry showed mixed restriction and obstruction.  Repeat CXR improved.  Continue Protonix to 40mg  daily.  Start pepcid 20mg  daily.  Start Symbicort 80 2 puffs twice a day with spacer and rinse mouth afterwards. Demonstrated proper use.   Repeat spirometry at next visit.  Monitor symptoms. If not improved may warrant getting CT chest as well.   Reactive airway disease See plan as above for cough.  Return in about 4 weeks (around 04/03/2018).

## 2018-04-04 ENCOUNTER — Ambulatory Visit (INDEPENDENT_AMBULATORY_CARE_PROVIDER_SITE_OTHER): Payer: PRIVATE HEALTH INSURANCE | Admitting: Allergy

## 2018-04-04 ENCOUNTER — Encounter: Payer: Self-pay | Admitting: Allergy

## 2018-04-04 VITALS — BP 130/78 | HR 78 | Resp 18

## 2018-04-04 DIAGNOSIS — J4540 Moderate persistent asthma, uncomplicated: Secondary | ICD-10-CM

## 2018-04-04 DIAGNOSIS — R05 Cough: Secondary | ICD-10-CM | POA: Diagnosis not present

## 2018-04-04 DIAGNOSIS — K219 Gastro-esophageal reflux disease without esophagitis: Secondary | ICD-10-CM | POA: Diagnosis not present

## 2018-04-04 DIAGNOSIS — R059 Cough, unspecified: Secondary | ICD-10-CM

## 2018-04-04 HISTORY — DX: Moderate persistent asthma, uncomplicated: J45.40

## 2018-04-04 MED ORDER — ALBUTEROL SULFATE HFA 108 (90 BASE) MCG/ACT IN AERS: 2.0000 | INHALATION_SPRAY | Freq: Four times a day (QID) | RESPIRATORY_TRACT | 1 refills | Status: DC | PRN

## 2018-04-04 NOTE — Assessment & Plan Note (Addendum)
Past history - coughing for 8 months. Negative skin testing in 2019. No improvement with Protonix 40mg  twice a day, zantac 300mg  daily, Nasacort nasal spray and Singulair and eliminating caffeine from diet. CXR showed atypical pneumonia which improved. Doxycycline and prednisone helped initially.  Interim history - Coughing is the same. No improvement with Symbicort. Complains of globus sensation.   Given clinical history, her coughing is most likely multifactorial.  Try dymista 1 spray twice a day for 2 weeks.   If not improved let me know and we will add singulair back again.  Refer to ENT to look at vocal cords. If unremarkable consider swallow evaluation/EGD/imaging of her neck in future.

## 2018-04-04 NOTE — Progress Notes (Signed)
Follow Up Note  RE: Tamara Myers MRN: [id number] DOB: 13-Apr-1956 Date of Office Visit: 04/04/2018  Referring provider: Elby Showers, MD Primary care provider: Elby Showers, MD  Chief Complaint: Cough (she thinks the cough is worse instead of better. she states that nothing seems to be working. )  History of Present Illness: I had the pleasure of seeing Tamara Myers for a follow up visit at the [facility] [place] on 04/04/2018. She is a 62 y.o. female, who is being followed for cough. Today she is here for regular follow up visit. Her previous allergy office visit was on 03/06/2018 with Dr. Maudie Mercury.   Cough Started Symbicort 80 2 puffs twice a day and feels like she can take more air in however her co-pay is $50 per month.   However her coughing is still the same and worse when laying down at night but can occur anytime during the day. She does have some coughing fits during the day as well. One time had a fit after end of her walk with a friend and another time after eating. The coughing may have gotten slightly worse. She is taking Protonix 40mg  BID and zantac 300mg  QD. She tried the Pepcid but it didn't work as well the zantac.   She also complains of some globus sensation in her throat as well which she sometimes has to massage out. No previous issues with swallowing. No ENT evaluation or EGD previously.   She tried nasal spray and Singulair with unknown benefit.  Assessment and Plan: Tamara Myers is a 62 y.o. female with: Moderate persistent asthma without complication Past history - coughing for 8 months. Negative skin testing in 2019. Questionable improvement in FEV1 post bronchodilator treatment at last visit which could have been due to technique.  Interim history - Breathing is better with Symbicort but coughing is the same.  Today's spirometry showed improvement from previous one.  . Daily controller medication(s): Symbicort 80 2 puffs twice a day . Prior to physical  activity: May use albuterol rescue inhaler 2 puffs 5 to 15 minutes prior to strenuous physical activities. Marland Kitchen Rescue medications: May use albuterol rescue inhaler 2 puffs or nebulizer every 4 to 6 hours as needed for shortness of breath, chest tightness, coughing, and wheezing. Monitor frequency of use.   Monitor symptoms. If not improved may warrant getting CT chest as well.   Cough Past history - coughing for 8 months. Negative skin testing in 2019. No improvement with Protonix 40mg  twice a day, zantac 300mg  daily, Nasacort nasal spray and Singulair and eliminating caffeine from diet. CXR showed atypical pneumonia which improved. Doxycycline and prednisone helped initially.  Interim history - Coughing is the same. No improvement with Symbicort. Complains of globus sensation.   Given clinical history, her coughing is most likely multifactorial.  Try dymista 1 spray twice a day for 2 weeks.   If not improved let me know and we will add singulair back again.  Refer to ENT to look at vocal cords. If unremarkable consider swallow evaluation/EGD/imaging of her neck in future.   LPRD (laryngopharyngeal reflux disease) Controlled with Protonix 40mg  BID and zantac 300mg  QD. Pepcid did not work as well.   Cont above meds.  Return in about 4 weeks (around 05/02/2018).  Meds ordered this encounter  Medications  . albuterol (PROVENTIL HFA;VENTOLIN HFA) 108 (90 Base) MCG/ACT inhaler    Sig: Inhale 2 puffs into the lungs every 6 (six) hours as needed for  wheezing or shortness of breath.    Dispense:  18 g    Refill:  1   Diagnostics: Spirometry:  Tracings reviewed. Her effort: It was hard to get consistent efforts and there is a question as to whether this reflects a maximal maneuver. FVC: 2.59L FEV1: 1.93L, 76% predicted FEV1/FVC ratio: 75% Interpretation: mild restriction and numbers much improved from previous spirometry.  Please see scanned spirometry results for details.  Medication  List:  Current Outpatient Medications  Medication Sig Dispense Refill  . amLODipine (NORVASC) 5 MG tablet TAKE 1 TABLET BY MOUTH EVERY DAY 90 tablet 3  . budesonide-formoterol (SYMBICORT) 80-4.5 MCG/ACT inhaler Inhale 2 puffs into the lungs 2 (two) times daily. 1 Inhaler 5  . Cholecalciferol (VITAMIN D) 2000 UNITS CAPS Take by mouth.    . pantoprazole (PROTONIX) 40 MG tablet Take 1 tablet (40 mg total) by mouth 2 (two) times daily. 180 tablet 1  . ranitidine (ZANTAC) 300 MG capsule Take 300 mg by mouth every evening.  5  . rosuvastatin (CRESTOR) 5 MG tablet Take 1 tablet (5 mg total) by mouth daily. 90 tablet 3  . triamterene-hydrochlorothiazide (MAXZIDE-25) 37.5-25 MG tablet TAKE 1 TABLET BY MOUTH EVERY DAY 30 tablet 5  . albuterol (PROVENTIL HFA;VENTOLIN HFA) 108 (90 Base) MCG/ACT inhaler Inhale 2 puffs into the lungs every 6 (six) hours as needed for wheezing or shortness of breath. 18 g 1   No current facility-administered medications for this visit.    Allergies: No Known Allergies I reviewed her past medical history, social history, family history, and environmental history and no significant changes have been reported from previous visit on 03/06/2018.  Review of Systems  Constitutional: Negative for appetite change, chills, fever and unexpected weight change.  HENT: Negative for congestion, postnasal drip and rhinorrhea.   Eyes: Negative for itching.  Respiratory: Positive for cough. Negative for chest tightness, shortness of breath and wheezing.   Gastrointestinal: Negative for abdominal pain.  Skin: Negative for rash.  Neurological: Negative for headaches.   Objective: BP 130/78 (BP Location: Left Arm, Patient Position: Sitting, Cuff Size: Normal)   Pulse 78   Resp 18   SpO2 98%  There is no height or weight on file to calculate BMI. Physical Exam  Constitutional: She is oriented to person, place, and time. She appears well-developed and well-nourished.  HENT:  Head:  Normocephalic and atraumatic.  Right Ear: External ear normal.  Left Ear: External ear normal.  Nose: Nose normal.  Mouth/Throat: Oropharynx is clear and moist.  Eyes: Conjunctivae and EOM are normal.  Neck: Neck supple.  Cardiovascular: Normal rate, regular rhythm and normal heart sounds. Exam reveals no gallop and no friction rub.  No murmur heard. Pulmonary/Chest: Effort normal and breath sounds normal. She has no wheezes. She has no rales.  Neurological: She is alert and oriented to person, place, and time.  Skin: Skin is warm. No rash noted.  Psychiatric: She has a normal mood and affect. Her behavior is normal.  Nursing note and vitals reviewed.  Previous notes and tests were reviewed. The plan was reviewed with the patient/family, and all questions/concerned were addressed.  It was my pleasure to see Tamara Myers today and participate in her care. Please feel free to contact me with any questions or concerns.  Sincerely,  Rexene Alberts, DO Allergy & Immunology  [facility] office: [phone] [place]

## 2018-04-04 NOTE — Patient Instructions (Addendum)
.   Daily controller medication(s): Symbicort 80 2 puffs twice a day . Prior to physical activity: May use albuterol rescue inhaler 2 puffs 5 to 15 minutes prior to strenuous physical activities. Marland Kitchen Rescue medications: May use albuterol rescue inhaler 2 puffs or nebulizer every 4 to 6 hours as needed for shortness of breath, chest tightness, coughing, and wheezing. Monitor frequency of use.   Continue protonix 40mg  twice a day Continue zantac 300mg  daily  Try dymista 1 spray twice a day.  If not improved let me know and we will add singulair back again.  Refer to ENT to look at your vocal cords   Follow up in 1 month

## 2018-04-04 NOTE — Assessment & Plan Note (Addendum)
Past history - coughing for 8 months. Negative skin testing in 2019. Questionable improvement in FEV1 post bronchodilator treatment at last visit which could have been due to technique.  Interim history - Breathing is better with Symbicort but coughing is the same.  Today's spirometry showed improvement from previous one.  . Daily controller medication(s): Symbicort 80 2 puffs twice a day . Prior to physical activity: May use albuterol rescue inhaler 2 puffs 5 to 15 minutes prior to strenuous physical activities. Marland Kitchen Rescue medications: May use albuterol rescue inhaler 2 puffs or nebulizer every 4 to 6 hours as needed for shortness of breath, chest tightness, coughing, and wheezing. Monitor frequency of use.   Monitor symptoms. If not improved may warrant getting CT chest as well.

## 2018-04-04 NOTE — Assessment & Plan Note (Signed)
Controlled with Protonix 40mg  BID and zantac 300mg  QD. Pepcid did not work as well.   Cont above meds.

## 2018-04-05 ENCOUNTER — Telehealth: Payer: Self-pay | Admitting: Allergy

## 2018-04-05 MED ORDER — FLUTICASONE-SALMETEROL 250-50 MCG/DOSE IN AEPB: 1.0000 | INHALATION_SPRAY | Freq: Two times a day (BID) | RESPIRATORY_TRACT | 2 refills | Status: DC

## 2018-04-05 NOTE — Telephone Encounter (Signed)
-----   Message from Rosalio Loud, [place] sent at 04/05/2018  8:02 AM EST ----- Regarding: RE: inhaler We can send in generic for Advair [facility]).  ----- Message ----- From: Garnet Sierras, DO Sent: 04/04/2018   9:23 PM EST To: Jaquita Folds Clinical Subject: inhaler                                        Can you check with her insurance and see if there's any cheaper inhaler than the symbicort? Patient says the co-pay is $50 per month. See if Advair, breo or Dulera is cheaper. Or if her insurance covers the generic Advair called [facility].  Thank you

## 2018-04-05 NOTE — Telephone Encounter (Signed)
I called patient and did not receive an answer. I was not able to leave a message.

## 2018-04-05 NOTE — Telephone Encounter (Signed)
Patient returned your call. Please call patient back.

## 2018-04-05 NOTE — Telephone Encounter (Signed)
Informed patient of the change and she will call us back if it is to much.

## 2018-04-05 NOTE — Telephone Encounter (Signed)
Please call patient. I sent in [place] 1 puff twice a day. Review how to use inhaler with patient and see how much it costs.   Thank you.

## 2018-04-15 ENCOUNTER — Telehealth: Payer: Self-pay

## 2018-04-15 NOTE — Telephone Encounter (Signed)
-----   Message from Tamara Myers sent at 04/10/2018  7:50 AM EST ----- Dr. Maudie Mercury would like the patient referred to ENT for evaluation of her vocal cords and persistent cough. Thank you.

## 2018-04-15 NOTE — Telephone Encounter (Signed)
Referral has been placed to Dr Velvet Bathe office.

## 2018-04-26 ENCOUNTER — Encounter: Payer: Self-pay | Admitting: Internal Medicine

## 2018-04-26 ENCOUNTER — Ambulatory Visit: Payer: PRIVATE HEALTH INSURANCE | Admitting: Internal Medicine

## 2018-04-26 VITALS — BP 140/90 | HR 82 | Temp 98.3°F | Ht 63.5 in | Wt 131.0 lb

## 2018-04-26 DIAGNOSIS — R0982 Postnasal drip: Secondary | ICD-10-CM

## 2018-04-26 DIAGNOSIS — J0100 Acute maxillary sinusitis, unspecified: Secondary | ICD-10-CM

## 2018-04-26 MED ORDER — AZITHROMYCIN 250 MG PO TABS: ORAL_TABLET | ORAL | 0 refills | Status: DC

## 2018-04-26 MED ORDER — HYDROCODONE-HOMATROPINE 5-1.5 MG/5ML PO SYRP: 5.0000 mL | ORAL_SOLUTION | Freq: Three times a day (TID) | ORAL | 0 refills | Status: DC | PRN

## 2018-04-26 NOTE — Progress Notes (Signed)
   Subjective:    Patient ID: Kafi Dotter Woodring, female    DOB: [birth year], 62 y.o.   MRN: [id number]  HPI 62 year old  Female with one week history of respiratory congestion and cough. No fever or chills. Exposed to co-worker who had similar symptoms. Some malaise and fatigue. Coughing at night.Discolored sputum and nasal drainage.  Had flu vaccine late August.    Review of Systems no nausea vomiting or diarrhea     Objective:   Physical Exam Skin warm and dry.  Nodes none.  Sounds nasally congested.  TMs are slightly full.  Pharynx very slightly injected without exudate.  Neck is supple.  Chest clear to auscultation without rales or wheezing.       Assessment & Plan:  Acute maxillary sinusitis with postnasal drip  Plan: Zithromax Z-PAK take 2 tablets day 1 followed by 1 tablet days 2 through 5.  Hycodan 1 teaspoon p.o. every 8 hours as needed cough.  Rest and drink plenty of fluids.

## 2018-04-26 NOTE — Patient Instructions (Signed)
Zithromax Z-PAK take 2 tablets day 1 followed by 1 tablet days 2 through 5.  Hycodan 1 teaspoon p.o. every 8 hours as needed cough.  Rest and drink plenty of fluids.

## 2018-04-29 ENCOUNTER — Other Ambulatory Visit: Payer: Self-pay | Admitting: Internal Medicine

## 2018-05-09 ENCOUNTER — Ambulatory Visit: Payer: PRIVATE HEALTH INSURANCE | Admitting: Allergy

## 2018-05-09 ENCOUNTER — Encounter: Payer: Self-pay | Admitting: Allergy

## 2018-05-09 VITALS — BP 138/76 | HR 64 | Resp 16

## 2018-05-09 DIAGNOSIS — R05 Cough: Secondary | ICD-10-CM

## 2018-05-09 DIAGNOSIS — R059 Cough, unspecified: Secondary | ICD-10-CM

## 2018-05-09 DIAGNOSIS — J4540 Moderate persistent asthma, uncomplicated: Secondary | ICD-10-CM | POA: Diagnosis not present

## 2018-05-09 DIAGNOSIS — K219 Gastro-esophageal reflux disease without esophagitis: Secondary | ICD-10-CM | POA: Diagnosis not present

## 2018-05-09 NOTE — Assessment & Plan Note (Signed)
Past history - coughing for 8 months. Negative skin testing in 2019. Questionable improvement in FEV1 post bronchodilator treatment at last visit which could have been due to technique.  Interim history - Breathing is maybe better with Symbicort.   Today's spirometry was slightly worse than previous but she just had URI over Christmas.  . Daily controller medication(s): Symbicort 80 2 puffs twice a day . Prior to physical activity: May use albuterol rescue inhaler 2 puffs 5 to 15 minutes prior to strenuous physical activities. Marland Kitchen Rescue medications: May use albuterol rescue inhaler 2 puffs or nebulizer every 4 to 6 hours as needed for shortness of breath, chest tightness, coughing, and wheezing. Monitor frequency of use.   Monitor symptoms. If not improved may warrant getting CT chest as well.

## 2018-05-09 NOTE — Assessment & Plan Note (Addendum)
Past history - coughing for 8 months. Negative skin testing in 2019. No improvement with Protonix 40mg  twice a day, zantac 300mg  daily, Nasacort nasal spray and Singulair and eliminating caffeine from diet. CXR showed atypical pneumonia which improved. Doxycycline and prednisone helped initially.  Interim history - Coughing is the same. No improvement with Symbicort and dymista. Still has globus sensation. Did not see ENT yet. She had URI over Christmas needing antibiotics.   Given clinical history, her coughing is most likely multifactorial.  Start prednisone taper. Call in 1 week to let us know about how your coughing is doing.   Refer to ENT. Please call [phone] to schedule appointment.   If unremarkable consider swallow evaluation/EGD/imaging of her neck in future.

## 2018-05-09 NOTE — Assessment & Plan Note (Signed)
Controlled with Protonix 40mg  BID and zantac 300mg  QD. Pepcid did not work as well.   Cont above meds.

## 2018-05-09 NOTE — Patient Instructions (Addendum)
Moderate persistent asthma without complication  Daily controller medication(s):continue Symbicort 80 2 puffs twice a day  Prior to physical activity:May use albuterol rescue inhaler 2 puffs 5 to 15 minutes prior to strenuous physical activities.  Rescue medications:May use albuterol rescue inhaler 2 puffs or nebulizer every 4 to 6 hours as needed for shortness of breath, chest tightness, coughing, and wheezing. Monitor frequency of use.   Monitor symptoms.  Cough  Start prednisone taper. Call in 1 week to let us know about how your coughing is doing.   Refer to ENT. Please call [phone]  LPRD (laryngopharyngeal reflux disease) Controlled with Protonix 40mg  BID and zantac 300mg  QD. Pepcid did not work as well.   Cont above meds.  Follow up in 6-8 weeks

## 2018-05-09 NOTE — Progress Notes (Signed)
Follow Up Note  RE: Tamara Myers MRN: [id number] DOB: October 19, 1955 Date of Office Visit: 05/09/2018  Referring provider: Elby Showers, MD Primary care provider: Elby Showers, MD  Chief Complaint: Asthma and Cough (still coughing same )  History of Present Illness: I had the pleasure of seeing Tamara Myers for a follow up visit at the [facility] [place] [place] on 05/09/2018. She is a 63 y.o. female, who is being followed for asthma, cough, LPRD. Today she is here for regular follow up visit. Her previous allergy office visit was on 04/04/2018 with Dr. Maudie Mercury.   Moderate persistent asthma without complication Currently on Symbicort 80 2 puffs twice a day with spacer and rinsing mouth afterwards. Still having coughing fits throughout the day about 3 times and always has an issue at night. The coughing fits lasts for a few minutes and usually drinks water with some benefit. Never used the albuterol. No specific triggers noted.  Denies any fevers or chills however patient had URI during Christmas and PCP treated with zpak.   Cough Tried dymista 1 spray BID x 2 weeks with unknown benefit as she stopped using it during her URI.  She still complains of globus sensation. No ENT visit yet.  LPRD (laryngopharyngeal reflux disease) Controlled with Protonix 40mg  BID and zantac 300mg  QD.   Assessment and Plan: Jeanann is a 63 y.o. female with: Cough Past history - coughing for 8 months. Negative skin testing in 2019. No improvement with Protonix 40mg  twice a day, zantac 300mg  daily, Nasacort nasal spray and Singulair and eliminating caffeine from diet. CXR showed atypical pneumonia which improved. Doxycycline and prednisone helped initially.  Interim history - Coughing is the same. No improvement with Symbicort and dymista. Still has globus sensation. Did not see ENT yet. She had URI over Christmas needing antibiotics.   Given clinical history, her coughing is most likely  multifactorial.  Start prednisone taper. Call in 1 week to let us know about how your coughing is doing.   Refer to ENT. Please call [phone] to schedule appointment.   If unremarkable consider swallow evaluation/EGD/imaging of her neck in future.   Moderate persistent asthma without complication Past history - coughing for 8 months. Negative skin testing in 2019. Questionable improvement in FEV1 post bronchodilator treatment at last visit which could have been due to technique.  Interim history - Breathing is maybe better with Symbicort.   Today's spirometry was slightly worse than previous but she just had URI over Christmas.  . Daily controller medication(s): Symbicort 80 2 puffs twice a day . Prior to physical activity: May use albuterol rescue inhaler 2 puffs 5 to 15 minutes prior to strenuous physical activities. Marland Kitchen Rescue medications: May use albuterol rescue inhaler 2 puffs or nebulizer every 4 to 6 hours as needed for shortness of breath, chest tightness, coughing, and wheezing. Monitor frequency of use.   Monitor symptoms. If not improved may warrant getting CT chest as well.   LPRD (laryngopharyngeal reflux disease) Controlled with Protonix 40mg  BID and zantac 300mg  QD. Pepcid did not work as well.   Cont above meds.  Return in about 2 months (around 07/08/2018).  Diagnostics: Spirometry:  Tracings reviewed. Her effort: It was hard to get consistent efforts and there is a question as to whether this reflects a maximal maneuver. FVC: 2.43L FEV1: 1.60L, 63% predicted FEV1/FVC ratio: 66% Interpretation: Spirometry consistent with mixed obstructive and restrictive disease and numbers not as good as previous one. Please  see scanned spirometry results for details.  Medication List:  Current Outpatient Medications  Medication Sig Dispense Refill  . albuterol (PROVENTIL HFA;VENTOLIN HFA) 108 (90 Base) MCG/ACT inhaler Inhale 2 puffs into the lungs every 6 (six) hours as  needed for wheezing or shortness of breath. 18 g 1  . amLODipine (NORVASC) 5 MG tablet TAKE 1 TABLET BY MOUTH EVERY DAY 90 tablet 3  . budesonide-formoterol (SYMBICORT) 80-4.5 MCG/ACT inhaler Inhale 2 puffs into the lungs 2 (two) times daily. 1 Inhaler 5  . Cholecalciferol (VITAMIN D) 2000 UNITS CAPS Take by mouth.    . Fluticasone-Salmeterol (WIXELA INHUB) 250-50 MCG/DOSE AEPB Inhale 1 puff into the lungs 2 (two) times daily. 60 each 2  . pantoprazole (PROTONIX) 40 MG tablet Take 1 tablet (40 mg total) by mouth 2 (two) times daily. 180 tablet 1  . ranitidine (ZANTAC) 300 MG capsule Take 300 mg by mouth every evening.  5  . rosuvastatin (CRESTOR) 5 MG tablet Take 1 tablet (5 mg total) by mouth daily. 90 tablet 3  . triamterene-hydrochlorothiazide (MAXZIDE-25) 37.5-25 MG tablet TAKE 1 TABLET BY MOUTH EVERY DAY 30 tablet 5   No current facility-administered medications for this visit.    Allergies: No Known Allergies I reviewed her past medical history, social history, family history, and environmental history and no significant changes have been reported from previous visit on 04/04/2018.  Review of Systems  Constitutional: Negative for appetite change, chills, fever and unexpected weight change.  HENT: Negative for congestion, postnasal drip and rhinorrhea.   Eyes: Negative for itching.  Respiratory: Positive for cough. Negative for chest tightness, shortness of breath and wheezing.   Gastrointestinal: Negative for abdominal pain.  Skin: Negative for rash.  Allergic/Immunologic: Negative for environmental allergies.  Neurological: Negative for headaches.   Objective: BP 138/76   Pulse 64   Resp 16  There is no height or weight on file to calculate BMI. Physical Exam  Constitutional: She is oriented to person, place, and time. She appears well-developed and well-nourished.  HENT:  Head: Normocephalic and atraumatic.  Right Ear: External ear normal.  Left Ear: External ear normal.   Nose: Nose normal.  Mouth/Throat: Oropharynx is clear and moist.  Eyes: Conjunctivae and EOM are normal.  Neck: Neck supple.  Cardiovascular: Normal rate, regular rhythm and normal heart sounds. Exam reveals no gallop and no friction rub.  No murmur heard. Pulmonary/Chest: Effort normal and breath sounds normal. She has no wheezes. She has no rales.  Neurological: She is alert and oriented to person, place, and time.  Skin: Skin is warm. No rash noted.  Psychiatric: She has a normal mood and affect. Her behavior is normal.  Nursing note and vitals reviewed.  Previous notes and tests were reviewed. The plan was reviewed with the patient/family, and all questions/concerned were addressed.  It was my pleasure to see Tamara Myers today and participate in her care. Please feel free to contact me with any questions or concerns.  Sincerely,  Rexene Alberts, DO Allergy & Immunology  [facility] office: [phone] [facility] office: [phone]

## 2018-05-10 ENCOUNTER — Ambulatory Visit: Payer: PRIVATE HEALTH INSURANCE | Admitting: Allergy

## 2018-05-15 ENCOUNTER — Telehealth: Payer: Self-pay | Admitting: *Deleted

## 2018-05-15 NOTE — Telephone Encounter (Signed)
I'm not going to prescribe anything else for now. I want her to go see ENT and I asked her to call the last time to schedule.  Have patient call [phone] to schedule appointment.

## 2018-05-15 NOTE — Telephone Encounter (Signed)
Patient called states she did not notice any improvement while on prednisone states the knot she feels in her throat felt better. States she has not heard anything from the ENT referral will reach out to [facility] on this matter

## 2018-05-15 NOTE — Telephone Encounter (Signed)
Called patient left voicemail to return call.

## 2018-05-15 NOTE — Telephone Encounter (Signed)
Dee patient has not had anything from ENT referral please help

## 2018-05-16 ENCOUNTER — Telehealth: Payer: Self-pay | Admitting: Allergy

## 2018-05-16 NOTE — Telephone Encounter (Signed)
Pt returning a nurse phone call. Call her on cell phone [phone].

## 2018-05-16 NOTE — Telephone Encounter (Signed)
See previous telephone note. 

## 2018-05-16 NOTE — Telephone Encounter (Signed)
Spoke to patient advised to call ENT directly number given to patient. She verbalized understanding

## 2018-06-08 ENCOUNTER — Other Ambulatory Visit: Payer: Self-pay | Admitting: Internal Medicine

## 2018-06-10 ENCOUNTER — Ambulatory Visit: Payer: PRIVATE HEALTH INSURANCE | Admitting: Allergy

## 2018-06-24 ENCOUNTER — Encounter: Payer: Self-pay | Admitting: Allergy

## 2018-06-24 ENCOUNTER — Ambulatory Visit: Payer: PRIVATE HEALTH INSURANCE | Admitting: Allergy

## 2018-06-24 VITALS — BP 110/84 | HR 88 | Resp 16

## 2018-06-24 DIAGNOSIS — R059 Cough, unspecified: Secondary | ICD-10-CM

## 2018-06-24 DIAGNOSIS — R05 Cough: Secondary | ICD-10-CM | POA: Diagnosis not present

## 2018-06-24 DIAGNOSIS — K219 Gastro-esophageal reflux disease without esophagitis: Secondary | ICD-10-CM | POA: Diagnosis not present

## 2018-06-24 DIAGNOSIS — J4540 Moderate persistent asthma, uncomplicated: Secondary | ICD-10-CM | POA: Diagnosis not present

## 2018-06-24 NOTE — Progress Notes (Signed)
Follow Up Note  RE: Tamara Myers MRN: [id number] DOB: [birth year] Date of Office Visit: 06/24/2018  Referring provider: Elby Showers, MD Primary care provider: Elby Showers, MD  Chief Complaint: Follow-up and Asthma  History of Present Illness: I had the pleasure of seeing Tamara Myers for a follow up visit at the [facility] [place] [facility] [place] on 06/24/2018. She is a 63 y.o. female, who is being followed for asthma, cough, LPRD. Today she is here for regular follow up visit. Her previous allergy office visit was on 05/09/2018 with Dr. Maudie Mercury.   Cough Patient saw ENT and was told she may have some irritation of the nerves and was prescribed gabapentin 300mg  to start daily and increase to bid and she states that her throat seems to feel better and feels less of a lump now.   However, coughing waxes and wanes but it does seem to be a little better.  No CT chest done in the past and concerned about lung disease as apparently her father died of some type of interstitial lung issue.   She also never had a full body PFT.   Moderate persistent asthma without complication  Currently on Wixela 250 1 puff BID for about 1 month and not sure if it made a difference or if the Symbicort worked better.  Using albuterol about once a week with good benefit. Not sure what's triggering her SOB episodes.   LPRD (laryngopharyngeal reflux disease) Controlled with Protonix 40mg  BID and zantac 300mg  QD.  Assessment and Plan: Tamara Myers is a 63 y.o. female with: Cough Past history - coughing for 12 months. Negative skin testing in 2019. No improvement with Protonix 40mg  twice a day, zantac 300mg  daily, Nasacort nasal spray, Singulair and eliminating caffeine from diet. CXR showed atypical pneumonia which improved. Doxycycline and prednisone helped initially.  Interim history - ENT evaluation was unremarkable but started on gabapentin 300mg  1-2 times a day and noted some slight improvement. Coughing waxes and  wanes.   Get bloodwork   Get CT chest with and without contrast to rule out anatomical issues.   Continue gabapentin 300mg  1-2 times a day per ENT.  Follow up with ENT as scheduled.   If unremarkable consider swallow evaluation/EGD/imaging of her neck in future.   Moderate persistent asthma without complication Past history - coughing for 12 months. Negative skin testing in 2019. Questionable improvement in FEV1 post bronchodilator treatment which could have been due to technique.  Interim history - Breathing is unchanged with Wixela.   Today's spirometry was still not very good and did not have good exhalation loop.  Get full body PFT.  Daily controller medication(s): Wixela 250 1 puff twice a day and rinse mouth afterwards.   Prior to physical activity:May use albuterol rescue inhaler 2 puffs 5 to 15 minutes prior to strenuous physical activities.  Rescue medications:May use albuterol rescue inhaler 2 puffs or nebulizer every 4 to 6 hours as needed for shortness of breath, chest tightness, coughing, and wheezing. Monitor frequency of use.   LPRD (laryngopharyngeal reflux disease) Controlled with Protonix 40mg  BID and zantac 300mg  QD. Pepcid did not work as well.   Cont above meds.  Return in about 2 months (around 08/23/2018).  Lab Orders     Basic Metabolic Panel (BMET)     Alpha-1-antitrypsin  Diagnostics: Spirometry:  Tracings reviewed. Her effort: Poor effort, data can not be interpreted. Please see scanned spirometry results for details.  Medication List:  Current Outpatient  Medications  Medication Sig Dispense Refill  . albuterol (PROVENTIL HFA;VENTOLIN HFA) 108 (90 Base) MCG/ACT inhaler Inhale 2 puffs into the lungs every 6 (six) hours as needed for wheezing or shortness of breath. 18 g 1  . amLODipine (NORVASC) 5 MG tablet TAKE 1 TABLET BY MOUTH EVERY DAY 90 tablet 3  . Cholecalciferol (VITAMIN D) 2000 UNITS CAPS Take by mouth.    . Fluticasone-Salmeterol  (WIXELA INHUB) 250-50 MCG/DOSE AEPB Inhale 1 puff into the lungs 2 (two) times daily. 60 each 2  . gabapentin (NEURONTIN) 300 MG capsule Take 300 mg by mouth 2 (two) times daily.    . pantoprazole (PROTONIX) 40 MG tablet Take 1 tablet (40 mg total) by mouth 2 (two) times daily. 180 tablet 1  . ranitidine (ZANTAC) 300 MG capsule Take 300 mg by mouth every evening.  5  . rosuvastatin (CRESTOR) 5 MG tablet TAKE 1 TABLET BY MOUTH EVERY DAY 30 tablet 11  . triamterene-hydrochlorothiazide (MAXZIDE-25) 37.5-25 MG tablet TAKE 1 TABLET BY MOUTH EVERY DAY 30 tablet 5   No current facility-administered medications for this visit.    Allergies: No Known Allergies I reviewed her past medical history, social history, family history, and environmental history and no significant changes have been reported from previous visit on 05/09/2018.  Review of Systems  Constitutional: Negative for appetite change, chills, fever and unexpected weight change.  HENT: Negative for congestion, postnasal drip and rhinorrhea.   Eyes: Negative for itching.  Respiratory: Positive for cough. Negative for chest tightness, shortness of breath and wheezing.   Gastrointestinal: Negative for abdominal pain.  Skin: Negative for rash.  Allergic/Immunologic: Negative for environmental allergies.  Neurological: Negative for headaches.   Objective: BP 110/84   Pulse 88   Resp 16   SpO2 98%  There is no height or weight on file to calculate BMI. Physical Exam  Constitutional: She is oriented to person, place, and time. She appears well-developed and well-nourished.  HENT:  Head: Normocephalic and atraumatic.  Right Ear: External ear normal.  Left Ear: External ear normal.  Nose: Nose normal.  Mouth/Throat: Oropharynx is clear and moist.  Eyes: Conjunctivae and EOM are normal.  Neck: Neck supple.  Cardiovascular: Normal rate, regular rhythm and normal heart sounds. Exam reveals no gallop and no friction rub.  No murmur  heard. Pulmonary/Chest: Effort normal and breath sounds normal. She has no wheezes. She has no rales.  Neurological: She is alert and oriented to person, place, and time.  Skin: Skin is warm. No rash noted.  Psychiatric: She has a normal mood and affect. Her behavior is normal.  Nursing note and vitals reviewed.  Previous notes and tests were reviewed. The plan was reviewed with the patient/family, and all questions/concerned were addressed.  It was my pleasure to see Tamara Myers today and participate in her care. Please feel free to contact me with any questions or concerns.  Sincerely,  Rexene Alberts, DO Allergy & Immunology  [facility] office: [phone] [facility] office: [phone]

## 2018-06-24 NOTE — Assessment & Plan Note (Addendum)
Past history - coughing for 12 months. Negative skin testing in 2019. Questionable improvement in FEV1 post bronchodilator treatment which could have been due to technique.  Interim history - Breathing is unchanged with Wixela.   Today's spirometry was still not very good and did not have good exhalation loop.  Get full body PFT.  Daily controller medication(s): Wixela 250 1 puff twice a day and rinse mouth afterwards.   Prior to physical activity:May use albuterol rescue inhaler 2 puffs 5 to 15 minutes prior to strenuous physical activities.  Rescue medications:May use albuterol rescue inhaler 2 puffs or nebulizer every 4 to 6 hours as needed for shortness of breath, chest tightness, coughing, and wheezing. Monitor frequency of use.

## 2018-06-24 NOTE — Assessment & Plan Note (Signed)
Past history - coughing for 12 months. Negative skin testing in 2019. No improvement with Protonix 40mg  twice a day, zantac 300mg  daily, Nasacort nasal spray, Singulair and eliminating caffeine from diet. CXR showed atypical pneumonia which improved. Doxycycline and prednisone helped initially.  Interim history - ENT evaluation was unremarkable but started on gabapentin 300mg  1-2 times a day and noted some slight improvement. Coughing waxes and wanes.   Get bloodwork   Get CT chest with and without contrast to rule out anatomical issues.   Continue gabapentin 300mg  1-2 times a day per ENT.  Follow up with ENT as scheduled.   If unremarkable consider swallow evaluation/EGD/imaging of her neck in future.

## 2018-06-24 NOTE — Assessment & Plan Note (Signed)
Controlled with Protonix 40mg  BID and zantac 300mg  QD. Pepcid did not work as well.   Cont above meds.

## 2018-06-24 NOTE — Patient Instructions (Addendum)
Cough  Get bloodwork   Get CT chest with and without contrast.   Get full body PFT at [facility] pulmonology  Continue gabapentin 300mg  1-2 times a day per ENT.  Follow up with ENT as scheduled.   Moderate persistent asthma without complication  Today's spirometry was still not very good.   Daily controller medication(s): Wixela 250 1 puff twice a day and rinse mouth afterwards.   Prior to physical activity:May use albuterol rescue inhaler 2 puffs 5 to 15 minutes prior to strenuous physical activities.  Rescue medications:May use albuterol rescue inhaler 2 puffs or nebulizer every 4 to 6 hours as needed for shortness of breath, chest tightness, coughing, and wheezing. Monitor frequency of use.   LPRD (laryngopharyngeal reflux disease)   Continue Protonix 40mg  twice a day and zantac 300mg  daily  Follow up in 2 months

## 2018-06-25 LAB — BASIC METABOLIC PANEL
BUN/Creatinine Ratio: 16 (ref 12–28)
BUN: 13 mg/dL (ref 8–27)
CO2: 27 mmol/L (ref 20–29)
Calcium: 9.5 mg/dL (ref 8.7–10.3)
Chloride: 103 mmol/L (ref 96–106)
Creatinine, Ser: 0.83 mg/dL (ref 0.57–1.00)
GFR calc Af Amer: 87 mL/min/{1.73_m2} (ref >59)
GFR calc non Af Amer: 76 mL/min/{1.73_m2} (ref >59)
Glucose: 91 mg/dL (ref 65–99)
Potassium: 4.2 mmol/L (ref 3.5–5.2)
SODIUM: 145 mmol/L — AB (ref 134–144)

## 2018-06-25 LAB — ALPHA-1-ANTITRYPSIN: A-1 Antitrypsin: 145 mg/dL (ref 101–187)

## 2018-06-27 ENCOUNTER — Ambulatory Visit (INDEPENDENT_AMBULATORY_CARE_PROVIDER_SITE_OTHER): Payer: PRIVATE HEALTH INSURANCE | Admitting: Internal Medicine

## 2018-06-27 ENCOUNTER — Encounter: Payer: Self-pay | Admitting: Allergy

## 2018-06-27 DIAGNOSIS — J4540 Moderate persistent asthma, uncomplicated: Secondary | ICD-10-CM

## 2018-06-27 DIAGNOSIS — R059 Cough, unspecified: Secondary | ICD-10-CM

## 2018-06-27 DIAGNOSIS — R05 Cough: Secondary | ICD-10-CM

## 2018-06-27 LAB — PULMONARY FUNCTION TEST
DL/VA % pred: 109 %
DL/VA: 4.61 ml/min/mmHg/L
DLCO unc % pred: 99 %
DLCO unc: 20.01 ml/min/mmHg
FEF 25-75 Post: 1.41 L/sec
FEF 25-75 Pre: 1.19 L/sec
FEF2575-%Change-Post: 18 %
FEF2575-%Pred-Post: 62 %
FEF2575-%Pred-Pre: 52 %
FEV1-%Change-Post: 16 %
FEV1-%Pred-Post: 85 %
FEV1-%Pred-Pre: 73 %
FEV1-Post: 2.13 L
FEV1-Pre: 1.83 L
FEV1FVC-%Change-Post: 16 %
FEV1FVC-%Pred-Pre: 83 %
FEV6-%Change-Post: 0 %
FEV6-%Pred-Post: 89 %
FEV6-%Pred-Pre: 89 %
FEV6-Post: 2.8 L
FEV6-Pre: 2.81 L
FEV6FVC-%Change-Post: 0 %
FEV6FVC-%Pred-Post: 103 %
FEV6FVC-%Pred-Pre: 103 %
FVC-%Change-Post: 0 %
FVC-%Pred-Post: 86 %
FVC-%Pred-Pre: 86 %
FVC-Post: 2.82 L
FVC-Pre: 2.82 L
PRE FEV6/FVC RATIO: 100 %
Post FEV1/FVC ratio: 76 %
Post FEV6/FVC ratio: 99 %
Pre FEV1/FVC ratio: 65 %
RV % pred: 80 %
RV: 1.63 L
TLC % pred: 90 %
TLC: 4.57 L

## 2018-06-27 NOTE — Progress Notes (Signed)
PFT done today. 

## 2018-06-28 ENCOUNTER — Other Ambulatory Visit: Payer: Self-pay | Admitting: Internal Medicine

## 2018-06-28 DIAGNOSIS — Z1231 Encounter for screening mammogram for malignant neoplasm of breast: Secondary | ICD-10-CM

## 2018-07-02 ENCOUNTER — Ambulatory Visit (HOSPITAL_COMMUNITY)
Admission: RE | Admit: 2018-07-02 | Discharge: 2018-07-02 | Disposition: A | Discharge status: Home / Self Care | Payer: PRIVATE HEALTH INSURANCE | Source: Ambulatory Visit | Attending: Allergy | Admitting: Allergy

## 2018-07-02 ENCOUNTER — Other Ambulatory Visit: Payer: PRIVATE HEALTH INSURANCE | Admitting: Internal Medicine

## 2018-07-02 DIAGNOSIS — J4540 Moderate persistent asthma, uncomplicated: Secondary | ICD-10-CM | POA: Diagnosis present

## 2018-07-02 DIAGNOSIS — R05 Cough: Secondary | ICD-10-CM | POA: Diagnosis present

## 2018-07-02 DIAGNOSIS — E7800 Pure hypercholesterolemia, unspecified: Secondary | ICD-10-CM

## 2018-07-02 DIAGNOSIS — R059 Cough, unspecified: Secondary | ICD-10-CM

## 2018-07-02 LAB — HEPATIC FUNCTION PANEL
AG Ratio: 2.6 (calc) — ABNORMAL HIGH (ref 1.0–2.5)
ALT: 24 U/L (ref 6–29)
AST: 25 U/L (ref 10–35)
Albumin: 4.5 g/dL (ref 3.6–5.1)
Alkaline phosphatase (APISO): 46 U/L (ref 37–153)
BILIRUBIN TOTAL: 0.8 mg/dL (ref 0.2–1.2)
Bilirubin, Direct: 0.2 mg/dL (ref 0.0–0.2)
Globulin: 1.7 g/dL (calc) — ABNORMAL LOW (ref 1.9–3.7)
Indirect Bilirubin: 0.6 mg/dL (calc) (ref 0.2–1.2)
Total Protein: 6.2 g/dL (ref 6.1–8.1)

## 2018-07-02 LAB — LIPID PANEL
CHOL/HDL RATIO: 1.9 (calc) (ref <5.0)
Cholesterol: 175 mg/dL (ref <200)
HDL: 93 mg/dL (ref >=50)
LDL Cholesterol (Calc): 67 mg/dL (calc)
Non-HDL Cholesterol (Calc): 82 mg/dL (calc) (ref <130)
Triglycerides: 70 mg/dL (ref <150)

## 2018-07-02 MED ORDER — SODIUM CHLORIDE (PF) 0.9 % IJ SOLN
INTRAMUSCULAR | Status: AC
  Filled 2018-07-02: qty 50

## 2018-07-02 MED ORDER — IOHEXOL 300 MG/ML  SOLN
75.0000 mL | Freq: Once | INTRAMUSCULAR | Status: AC | PRN
  Administered 2018-07-02: 75 mL via INTRAVENOUS

## 2018-07-03 ENCOUNTER — Telehealth: Payer: Self-pay | Admitting: *Deleted

## 2018-07-03 NOTE — Telephone Encounter (Signed)
-----   Message from Garnet Sierras, DO sent at 07/03/2018 12:29 PM EST ----- Regarding: please call patient Please call patient and see if she can come in for OV tomorrow to discuss her recent CT scan results. Please block off 2 return visits.  Thank you.

## 2018-07-03 NOTE — Telephone Encounter (Signed)
Called patient appt maded for 07/04/2018 @ 5:30 pm for 30 min patient verbalized understanding

## 2018-07-04 ENCOUNTER — Telehealth: Payer: Self-pay | Admitting: *Deleted

## 2018-07-04 ENCOUNTER — Ambulatory Visit: Payer: PRIVATE HEALTH INSURANCE | Admitting: Internal Medicine

## 2018-07-04 ENCOUNTER — Encounter: Payer: Self-pay | Admitting: Allergy

## 2018-07-04 ENCOUNTER — Ambulatory Visit (INDEPENDENT_AMBULATORY_CARE_PROVIDER_SITE_OTHER): Payer: PRIVATE HEALTH INSURANCE | Admitting: Allergy

## 2018-07-04 ENCOUNTER — Encounter: Payer: Self-pay | Admitting: Internal Medicine

## 2018-07-04 VITALS — BP 130/80 | HR 82 | Ht 63.5 in | Wt 131.0 lb

## 2018-07-04 VITALS — BP 112/74 | HR 90 | Resp 18

## 2018-07-04 DIAGNOSIS — K219 Gastro-esophageal reflux disease without esophagitis: Secondary | ICD-10-CM | POA: Diagnosis not present

## 2018-07-04 DIAGNOSIS — R059 Cough, unspecified: Secondary | ICD-10-CM

## 2018-07-04 DIAGNOSIS — R05 Cough: Secondary | ICD-10-CM | POA: Diagnosis not present

## 2018-07-04 DIAGNOSIS — E7800 Pure hypercholesterolemia, unspecified: Secondary | ICD-10-CM

## 2018-07-04 DIAGNOSIS — I10 Essential (primary) hypertension: Secondary | ICD-10-CM

## 2018-07-04 NOTE — Telephone Encounter (Signed)
Tamara Myers can you call pulmonology and set up the appt ASAP please

## 2018-07-04 NOTE — Progress Notes (Signed)
Follow Up Note  RE: Tamara Myers MRN: [id number] DOB: [birth year] Date of Office Visit: 07/04/2018  Referring provider: Elby Showers, MD Primary care provider: Elby Showers, MD  Chief Complaint: Advice Only  History of Present Illness: I had the pleasure of seeing Tamara Myers for a follow up visit at the [facility] [place] [place] on 07/05/2018. She is a 63 y.o. female, who is being followed for cough. Today she is here to discuss test results. Her previous allergy office visit was on 06/24/2018 with Dr. Maudie Mercury.   Cough Coughing is about the same and it waxes and wanes.  Feels like the Neurontin is helping with her globus sensation but it does make her a little loopy. Still taking Wixela 245mcg 1 puff BID. Got CT chest and full body PFT done.  Denies having any skin or eye issues. Does have some joint pain here and there. She does complain of some fatigue but thought this was common for her age. Denies any fevers or weight loss. No recent ekg or TB testing done.   LPRD (laryngopharyngeal reflux disease) Not sure how much is Protonix and zantac is helping.  06/27/2018 PFT: Conclusions: Minimal airway obstruction is present suggesting small airway disease.  07/02/2018 CT chest: IMPRESSION: 1. 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 Additionally, there is increased soft tissue within bilateral hilar regions with an enlarged right hilar lymph node measuring 1.4 cm. Also likely reflecting sarcoid. 2. Dominant nodule within the right lower lobe measures 1 cm and is also likely secondary to sarcoid. Recommend follow-up imaging in 3-6 months.  Assessment and Plan: Tamara Myers is a 63 y.o. female with: Cough Past history - coughing for 12 months. Negative skin testing in 2019. No improvement with Protonix 40mg  twice a day, zantac 300mg  daily, Nasacort nasal spray, Singulair and eliminating caffeine from diet. CXR showed  atypical pneumonia which improved. Doxycycline and prednisone helped initially. ENT evaluation unremarkable and started on Neurontin 300mg  BID. Interim history - full PFT and CT chest done.  PFT showed: Minimal airway obstruction is present suggesting small airway disease  CT Chest: concerning for sarcoidosis.   Refer to pulmonology for further evaluation/work up.    Call ENT about decreasing gabapentin 300mg  to once a day as it's making her feel out of it.   For now we will continue Wixela 250 1 puff twice a day and rinse mouth afterwards.   LPRD (laryngopharyngeal reflux disease) Taper off Protonix and zantac as doubtful that they are causing the coughing given her recent CT chest findings.  Return in about 2 months (around 09/03/2018).  Diagnostics: None.  Medication List:  Current Outpatient Medications  Medication Sig Dispense Refill  . albuterol (PROVENTIL HFA;VENTOLIN HFA) 108 (90 Base) MCG/ACT inhaler Inhale 2 puffs into the lungs every 6 (six) hours as needed for wheezing or shortness of breath. 18 g 1  . amLODipine (NORVASC) 5 MG tablet TAKE 1 TABLET BY MOUTH EVERY DAY 90 tablet 3  . Cholecalciferol (VITAMIN D) 2000 UNITS CAPS Take by mouth.    . Fluticasone-Salmeterol (WIXELA INHUB) 250-50 MCG/DOSE AEPB Inhale 1 puff into the lungs 2 (two) times daily. 60 each 2  . gabapentin (NEURONTIN) 300 MG capsule Take 300 mg by mouth 2 (two) times daily.    . pantoprazole (PROTONIX) 40 MG tablet Take 1 tablet (40 mg total) by mouth 2 (two) times daily. 180 tablet 1  . ranitidine (ZANTAC) 300 MG capsule Take 300  mg by mouth every evening.  5  . rosuvastatin (CRESTOR) 5 MG tablet TAKE 1 TABLET BY MOUTH EVERY DAY 30 tablet 11  . triamterene-hydrochlorothiazide (MAXZIDE-25) 37.5-25 MG tablet TAKE 1 TABLET BY MOUTH EVERY DAY 30 tablet 5   No current facility-administered medications for this visit.    Allergies: No Known Allergies I reviewed her past medical history, social history,  family history, and environmental history and no significant changes have been reported from previous visit on 06/24/2018.  Review of Systems  Constitutional: Positive for fatigue. Negative for appetite change, chills, fever and unexpected weight change.  HENT: Negative for congestion, postnasal drip and rhinorrhea.   Eyes: Negative for itching.  Respiratory: Positive for cough. Negative for chest tightness, shortness of breath and wheezing.   Gastrointestinal: Negative for abdominal pain.  Skin: Negative for rash.  Allergic/Immunologic: Negative for environmental allergies.  Neurological: Negative for headaches.   Objective: BP 112/74 (BP Location: Left Arm, Patient Position: Sitting, Cuff Size: Normal)   Pulse 90   Resp 18   SpO2 97%  There is no height or weight on file to calculate BMI. Physical Exam  Constitutional: She is oriented to person, place, and time. She appears well-developed and well-nourished.  HENT:  Head: Normocephalic and atraumatic.  Right Ear: External ear normal.  Left Ear: External ear normal.  Nose: Nose normal.  Mouth/Throat: Oropharynx is clear and moist.  Eyes: Conjunctivae and EOM are normal.  Neck: Neck supple.  Cardiovascular: Normal rate, regular rhythm and normal heart sounds. Exam reveals no gallop and no friction rub.  No murmur heard. Pulmonary/Chest: Effort normal and breath sounds normal. She has no wheezes. She has no rales.  Neurological: She is alert and oriented to person, place, and time.  Skin: Skin is warm. No rash noted.  Psychiatric: She has a normal mood and affect. Her behavior is normal.  Nursing note and vitals reviewed.  Previous notes and tests were reviewed. The plan was reviewed with the patient/family, and all questions/concerned were addressed.  It was my pleasure to see Tamara Myers today and participate in her care. Please feel free to contact me with any questions or concerns.  Sincerely,  Rexene Alberts, DO Allergy &  Immunology  [facility] office: [phone] [facility] office: [phone]  25 minutes spent face-to-face with more than 50% of the time spent discussing coughing and possible sarcoidosis diagnosis.

## 2018-07-04 NOTE — Patient Instructions (Addendum)
Cough/sarcoid  Refer to pulmonology    Call ENT about decreasing gabapentin 300mg   Follow up with ENT as scheduled.   Moderate persistent asthma without complication  Daily controller medication(s): Wixela 250 1 puff twice a day and rinse mouth afterwards.   Prior to physical activity:May use albuterol rescue inhaler 2 puffs 5 to 15 minutes prior to strenuous physical activities.  Rescue medications:May use albuterol rescue inhaler 2 puffs or nebulizer every 4 to 6 hours as needed for shortness of breath, chest tightness, coughing, and wheezing. Monitor frequency of use.   LPRD (laryngopharyngeal reflux disease)  Stop zantac and if no worsening symptoms then stop Protonix as well.  Keep appointment in April if needed.

## 2018-07-04 NOTE — Progress Notes (Signed)
   Subjective:    Patient ID: Tamara Myers, female    DOB: 14-Dec-1955, 63 y.o.   MRN: [id number]  HPI  63 year old Female for 6 month follow up.  She has been under evaluation by allergist for chronic hacking type cough.  A CT scan of the chest is been done recently and the results are to be discussed with her by allergist after this visit.  Says cough is stable.  Using an inhaler.  Was given Neurontin for globus sensation.  Noticed with yellow pharyngeal reflux disease by allergist and is on Protonix and Zantac.  Monitor showed minimal airway obstruction small airways disease.  Regarding hyperlipidemia total cholesterol is 175, HDL cholesterol 93, triglycerides 70 and LDL cholesterol 67.  SGOT and SGPT are normal as well as alkaline phosphatase and bilirubin.  With regard to hypertension her blood pressure is stable on amlodipine and Maxide 25.    Review of Systems     Objective:   Physical Exam Blood pressure 130/80, weight 131 pounds, pulse 82 and regular.  BMI 22.84.  Skin warm and dry.  No cervical adenopathy.  TMs and pharynx are clear.  Neck is supple.  Chest clear to auscultation.  Cardiac exam regular rate and rhythm.  No lower extremity edema.       Assessment & Plan:  Essential hypertension  Hyperlipidemia  Chronic cough being evaluated by pulmonary  Plan: I am pleased with blood pressure control and I am pleased with her lipid control.  Continue same medications and follow-up with physical exam in 6 months.  She has appointment following this visit with an allergist to discuss her chest CT scan.

## 2018-07-04 NOTE — Telephone Encounter (Signed)
Tamara Myers can you please refer patient to [facility] or [facility] for sarcoidosis per Dr Maudie Mercury.

## 2018-07-05 ENCOUNTER — Encounter: Payer: Self-pay | Admitting: Allergy

## 2018-07-05 NOTE — Telephone Encounter (Signed)
Patient will call PCP for EKG.

## 2018-07-05 NOTE — Telephone Encounter (Signed)
Please advise 

## 2018-07-05 NOTE — Telephone Encounter (Signed)
That's fine the 3/18 appointment. Regarding the EKG - ask patient to call PCP and see if they do EKGs in their office. Otherwise I'm not sure where I could send her to get the EKG done.  Thank you.

## 2018-07-05 NOTE — Progress Notes (Signed)
Agree with pulmonary evaluation

## 2018-07-05 NOTE — Assessment & Plan Note (Signed)
Taper off Protonix and zantac as doubtful that they are causing the coughing given her recent CT chest findings.

## 2018-07-05 NOTE — Telephone Encounter (Signed)
The best I could do was 07/17/2018 with Dr Valeta Harms. Both doctors were booked further out. Patient has been notified. She also asked about the EKG Dr Maudie Mercury wanted her to get. Please Advise.

## 2018-07-05 NOTE — Assessment & Plan Note (Signed)
Past history - coughing for 12 months. Negative skin testing in 2019. No improvement with Protonix 40mg  twice a day, zantac 300mg  daily, Nasacort nasal spray, Singulair and eliminating caffeine from diet. CXR showed atypical pneumonia which improved. Doxycycline and prednisone helped initially. ENT evaluation unremarkable and started on Neurontin 300mg  BID. Interim history - full PFT and CT chest done.  PFT showed: Minimal airway obstruction is present suggesting small airway disease  CT Chest: concerning for sarcoidosis.   Refer to pulmonology for further evaluation/work up.    Call ENT about decreasing gabapentin 300mg  to once a day as it's making her feel out of it.   For now we will continue Wixela 250 1 puff twice a day and rinse mouth afterwards.

## 2018-07-08 ENCOUNTER — Telehealth: Payer: Self-pay

## 2018-07-08 NOTE — Telephone Encounter (Signed)
Yes

## 2018-07-08 NOTE — Telephone Encounter (Signed)
Patient called states she went to allergy and asthma on Thursday they want to refer her to a pulmonologist but they want to do an EKG before and she wants to know if she can come here to have it done?

## 2018-07-09 ENCOUNTER — Ambulatory Visit (INDEPENDENT_AMBULATORY_CARE_PROVIDER_SITE_OTHER): Payer: PRIVATE HEALTH INSURANCE | Admitting: Internal Medicine

## 2018-07-09 DIAGNOSIS — D860 Sarcoidosis of lung: Secondary | ICD-10-CM

## 2018-07-09 DIAGNOSIS — Z0000 Encounter for general adult medical examination without abnormal findings: Secondary | ICD-10-CM | POA: Diagnosis not present

## 2018-07-11 ENCOUNTER — Encounter: Payer: Self-pay | Admitting: Internal Medicine

## 2018-07-11 NOTE — Progress Notes (Signed)
EKG performed at pt request. This was suggested by Allergist. She is seeing Pulmonary soon for possible sarcoidosis

## 2018-07-11 NOTE — Patient Instructions (Signed)
EKG performed.

## 2018-07-17 ENCOUNTER — Encounter: Payer: Self-pay | Admitting: Pulmonary Disease

## 2018-07-17 ENCOUNTER — Ambulatory Visit (INDEPENDENT_AMBULATORY_CARE_PROVIDER_SITE_OTHER): Payer: PRIVATE HEALTH INSURANCE | Admitting: Pulmonary Disease

## 2018-07-17 ENCOUNTER — Other Ambulatory Visit: Payer: Self-pay

## 2018-07-17 VITALS — BP 130/70 | HR 74 | Ht 63.5 in | Wt 130.8 lb

## 2018-07-17 DIAGNOSIS — R05 Cough: Secondary | ICD-10-CM | POA: Diagnosis not present

## 2018-07-17 DIAGNOSIS — R059 Cough, unspecified: Secondary | ICD-10-CM

## 2018-07-17 DIAGNOSIS — D869 Sarcoidosis, unspecified: Secondary | ICD-10-CM | POA: Diagnosis not present

## 2018-07-17 MED ORDER — FLUTICASONE-SALMETEROL 250-50 MCG/DOSE IN AEPB: 1.0000 | INHALATION_SPRAY | Freq: Two times a day (BID) | RESPIRATORY_TRACT | 0 refills | Status: DC

## 2018-07-17 NOTE — Progress Notes (Signed)
Synopsis: Referred in March 2020 for abnormal CT of the chest by Elby Showers, MD  Subjective:   PATIENT ID: Tamara Myers GENDER: female DOB: Oct 28, 1955, MRN: [id number]  Chief Complaint  Patient presents with  . Pulmonary Consult    cough x 1 year, CT scan     Patient has had complaints of progressive cough for the past year.  She was recently placed on a PPI for treatment of reflux.  She was ultimately referred to allergy.  She saw Dr. Maudie Mercury which evaluated for her cough.  She had a chest x-ray which revealed interstitial markings.  She ultimately had a noncontrast CT of the chest which revealed perilymphatic micro-nodularity consistent with pulmonary sarcoidosis with small areas of bilateral hilar adenopathy and a right-sided 1.4 cm hilar lymph node.  She had a dominant right-sided right lower lobe nodule that measured 1 cm in size also likely secondary to sarcoid however recommending a 3 to 41-month follow-up.  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  No family history of sarcoidosis.  Father died of pulmonary fibrosis.  Patient has not had any eye or skin symptoms.    Past Medical History:  Diagnosis Date  . Cancer ([place])    basal cell carcinoma  . Complication of anesthesia    slow to wake up  . Hypertension   . Moderate persistent asthma without complication 36/05/4429  . Post menopausal syndrome      Family History  Problem Relation Age of Onset  . Diabetes Father   . Heart disease Father   . Hyperlipidemia Father   . Hypertension Father   . Pulmonary fibrosis Father   . Asthma Daughter   . Breast cancer Neg Hx   . Allergic rhinitis Neg Hx   . Eczema Neg Hx   . Urticaria Neg Hx   . Angioedema Neg Hx      Past Surgical History:  Procedure Laterality Date  . CESAREAN SECTION    . OPEN REDUCTION INTERNAL FIXATION (ORIF) METACARPAL Right  11/24/2014   Procedure: OPEN REDUCTION INTERNAL FIXATION (ORIF) RIGHT THUMB;  Surgeon: Iran Planas, MD;  Location: [facility];  Service: Orthopedics;  Laterality: Right;  . thumb surgery Right 2014    Social History   Socioeconomic History  . Marital status: Married    Spouse name: Not on file  . Number of children: Not on file  . Years of education: Not on file  . Highest education level: Not on file  Occupational History  . Not on file  Social Needs  . Financial resource strain: Not on file  . Food insecurity:    Worry: Not on file    Inability: Not on file  . Transportation needs:    Medical: Not on file    Non-medical: Not on file  Tobacco Use  . Smoking status: Never Smoker  . Smokeless tobacco: Never Used  Substance and Sexual Activity  . Alcohol use: No  . Drug use: No  . Sexual activity: Not on file  Lifestyle  . Physical activity:    Days per week: Not on file    Minutes per session: Not on file  . Stress: Not on file  Relationships  . Social connections:    Talks on phone: Not on file    Gets together: Not on file    Attends religious service: Not on file  Active member of club or organization: Not on file    Attends meetings of clubs or organizations: Not on file    Relationship status: Not on file  . Intimate partner violence:    Fear of current or ex partner: Not on file    Emotionally abused: Not on file    Physically abused: Not on file    Forced sexual activity: Not on file  Other Topics Concern  . Not on file  Social History Narrative  . Not on file     No Known Allergies   Outpatient Medications Prior to Visit  Medication Sig Dispense Refill  . albuterol (PROVENTIL HFA;VENTOLIN HFA) 108 (90 Base) MCG/ACT inhaler Inhale 2 puffs into the lungs every 6 (six) hours as needed for wheezing or shortness of breath. 18 g 1  . amLODipine (NORVASC) 5 MG tablet TAKE 1 TABLET BY MOUTH EVERY DAY 90 tablet 3  . Cholecalciferol (VITAMIN D) 2000 UNITS CAPS  Take by mouth.    . gabapentin (NEURONTIN) 300 MG capsule Take 300 mg by mouth 2 (two) times daily.    . pantoprazole (PROTONIX) 40 MG tablet Take 1 tablet (40 mg total) by mouth 2 (two) times daily. 180 tablet 1  . ranitidine (ZANTAC) 300 MG capsule Take 300 mg by mouth every evening.  5  . rosuvastatin (CRESTOR) 5 MG tablet TAKE 1 TABLET BY MOUTH EVERY DAY 30 tablet 11  . triamterene-hydrochlorothiazide (MAXZIDE-25) 37.5-25 MG tablet TAKE 1 TABLET BY MOUTH EVERY DAY 30 tablet 5  . Fluticasone-Salmeterol (WIXELA INHUB) 250-50 MCG/DOSE AEPB Inhale 1 puff into the lungs 2 (two) times daily. 60 each 2   No facility-administered medications prior to visit.     Review of Systems  Constitutional: Negative for chills, fever, malaise/fatigue and weight loss.  HENT: Negative for hearing loss, sore throat and tinnitus.   Eyes: Negative for blurred vision and double vision.  Respiratory: Positive for cough and shortness of breath. Negative for hemoptysis, sputum production, wheezing and stridor.   Cardiovascular: Negative for chest pain, palpitations, orthopnea, leg swelling and PND.  Gastrointestinal: Negative for abdominal pain, constipation, diarrhea, heartburn, nausea and vomiting.  Genitourinary: Negative for dysuria, hematuria and urgency.  Musculoskeletal: Negative for joint pain and myalgias.  Skin: Negative for itching and rash.  Neurological: Negative for dizziness, tingling, weakness and headaches.  Endo/Heme/Allergies: Negative for environmental allergies. Does not bruise/bleed easily.  Psychiatric/Behavioral: Negative for depression. The patient is not nervous/anxious and does not have insomnia.   All other systems reviewed and are negative.    Objective:  Physical Exam Vitals signs reviewed.  Constitutional:      General: She is not in acute distress.    Appearance: She is well-developed.  HENT:     Head: Normocephalic and atraumatic.  Eyes:     General: No scleral icterus.     Conjunctiva/sclera: Conjunctivae normal.     Pupils: Pupils are equal, round, and reactive to light.  Neck:     Musculoskeletal: Neck supple.     Vascular: No JVD.     Trachea: No tracheal deviation.  Cardiovascular:     Rate and Rhythm: Normal rate and regular rhythm.     Heart sounds: Normal heart sounds. No murmur.  Pulmonary:     Effort: Pulmonary effort is normal. No tachypnea, accessory muscle usage or respiratory distress.     Breath sounds: Normal breath sounds. No stridor. No wheezing, rhonchi or rales.  Abdominal:     General: Bowel sounds are  normal. There is no distension.     Palpations: Abdomen is soft.     Tenderness: There is no abdominal tenderness.  Musculoskeletal:        General: No tenderness.  Lymphadenopathy:     Cervical: No cervical adenopathy.  Skin:    General: Skin is warm and dry.     Capillary Refill: Capillary refill takes less than 2 seconds.     Findings: No rash.  Neurological:     Mental Status: She is alert and oriented to person, place, and time.  Psychiatric:        Behavior: Behavior normal.      Vitals:   07/17/18 1400  BP: 130/70  Pulse: 74  SpO2: 99%  Weight: 130 lb 12.8 oz (59.3 kg)  Height: 5' 3.5" (1.613 m)   99% on RA BMI Readings from Last 3 Encounters:  07/17/18 22.81 kg/m  07/04/18 22.84 kg/m  04/26/18 22.84 kg/m   Wt Readings from Last 3 Encounters:  07/17/18 130 lb 12.8 oz (59.3 kg)  07/04/18 131 lb (59.4 kg)  04/26/18 131 lb (59.4 kg)     CBC    Component Value Date/Time   WBC 3.6 (L) 12/21/2017 0907   RBC 5.07 12/21/2017 0907   HGB 14.9 12/21/2017 0907   HCT 44.4 12/21/2017 0907   PLT 263 12/21/2017 0907   MCV 87.6 12/21/2017 0907   MCV 88.1 07/29/2011 1126   MCH 29.4 12/21/2017 0907   MCHC 33.6 12/21/2017 0907   RDW 11.8 12/21/2017 0907   LYMPHSABS 508 (L) 12/21/2017 0907   MONOABS 310 12/19/2016 0923   EOSABS 83 12/21/2017 0907   BASOSABS 22 12/21/2017 0907    Chest Imaging: CT chest  07/03/2018: Evidence of peri-lymphatic bilateral micro-nodularity consistent with sarcoidosis, small mediastinal and hilar lymph nodes.  And a right lower lobe 1 cm nodule likely consistent with the sarcoid however recommending 3 to 93-month follow-up by radiology. The patient's images have been independently reviewed by me.    Pulmonary Functions Testing Results: PFT Results Latest Ref Rng & Units 06/27/2018  FVC-Pre L 2.82  FVC-Predicted Pre % 86  FVC-Post L 2.82  FVC-Predicted Post % 86  Pre FEV1/FVC % % 65  Post FEV1/FCV % % 76  FEV1-Pre L 1.83  FEV1-Predicted Pre % 73  FEV1-Post L 2.13  DLCO UNC% % 99  DLCO COR %Predicted % 109  TLC L 4.57  TLC % Predicted % 90  RV % Predicted % 80     FeNO: None   Pathology: None   Echocardiogram: None   Heart Catheterization: None     Assessment & Plan:   Cough  Sarcoidosis - Plan: Pulmonary function test, CT Chest Wo Contrast  Discussion:  This is a 63 year old female with CT evidence of sarcoidosis.  I do think she would benefit from bronchoscopy for confirmation of the disease however due to the current COVID-19 viral outbreak all elective procedures throughout health system have been canceled.  Therefore, I think we should follow the patient clinically.  If she was to have worsening symptoms we could consider pursuing bronchoscopy.  I do believe however she will remain stable and we can view this disease for any progression with CT imaging at 3 months.  Hopefully by then we will no longer be under these elective procedure restrictions.  Therefore we will obtain a CT noncontrast of the chest in 3 months. Patient to return to clinic in 3 months to review this images along with set  of full PFTs.  Greater than 50% of this patient's 45-minute office visit was spent face-to-face discussing above recommendations and treatment plan as well as discussing potential etiologies and review of the patient's CT scan today in the office.    Current Outpatient Medications:  .  albuterol (PROVENTIL HFA;VENTOLIN HFA) 108 (90 Base) MCG/ACT inhaler, Inhale 2 puffs into the lungs every 6 (six) hours as needed for wheezing or shortness of breath., Disp: 18 g, Rfl: 1 .  amLODipine (NORVASC) 5 MG tablet, TAKE 1 TABLET BY MOUTH EVERY DAY, Disp: 90 tablet, Rfl: 3 .  Cholecalciferol (VITAMIN D) 2000 UNITS CAPS, Take by mouth., Disp: , Rfl:  .  gabapentin (NEURONTIN) 300 MG capsule, Take 300 mg by mouth 2 (two) times daily., Disp: , Rfl:  .  pantoprazole (PROTONIX) 40 MG tablet, Take 1 tablet (40 mg total) by mouth 2 (two) times daily., Disp: 180 tablet, Rfl: 1 .  ranitidine (ZANTAC) 300 MG capsule, Take 300 mg by mouth every evening., Disp: , Rfl: 5 .  rosuvastatin (CRESTOR) 5 MG tablet, TAKE 1 TABLET BY MOUTH EVERY DAY, Disp: 30 tablet, Rfl: 11 .  triamterene-hydrochlorothiazide (MAXZIDE-25) 37.5-25 MG tablet, TAKE 1 TABLET BY MOUTH EVERY DAY, Disp: 30 tablet, Rfl: 5 .  Fluticasone-Salmeterol (WIXELA INHUB) 250-50 MCG/DOSE AEPB, Inhale 1 puff into the lungs 2 (two) times daily., Disp: 60 each, Rfl: 2   Garner Nash, DO [facility] 07/17/2018 2:07 PM

## 2018-07-17 NOTE — Patient Instructions (Signed)
Thank you for visiting Dr. Valeta Harms at [facility]. Today we recommend the following:  CT Chest WO Contrast in 3 months  Full PFTs upon return  Return in about 3 months (around 10/17/2018). with completed studies.

## 2018-07-21 ENCOUNTER — Other Ambulatory Visit: Payer: Self-pay | Admitting: Internal Medicine

## 2018-07-26 ENCOUNTER — Ambulatory Visit: Payer: PRIVATE HEALTH INSURANCE

## 2018-08-14 ENCOUNTER — Encounter: Payer: Self-pay | Admitting: Internal Medicine

## 2018-08-14 NOTE — Patient Instructions (Signed)
Continue medications for hypertension and hyperlipidemia.  Schedule physical exam in 6 months.

## 2018-08-22 ENCOUNTER — Ambulatory Visit: Payer: PRIVATE HEALTH INSURANCE

## 2018-08-23 ENCOUNTER — Other Ambulatory Visit: Payer: Self-pay

## 2018-08-23 MED ORDER — PANTOPRAZOLE SODIUM 40 MG PO TBEC: 40.0000 mg | DELAYED_RELEASE_TABLET | Freq: Two times a day (BID) | ORAL | 1 refills | Status: DC

## 2018-08-26 ENCOUNTER — Ambulatory Visit: Payer: PRIVATE HEALTH INSURANCE | Admitting: Allergy

## 2018-10-15 ENCOUNTER — Other Ambulatory Visit: Payer: Self-pay

## 2018-10-15 MED ORDER — FLUTICASONE-SALMETEROL 250-50 MCG/DOSE IN AEPB: 1.0000 | INHALATION_SPRAY | Freq: Two times a day (BID) | RESPIRATORY_TRACT | 1 refills | Status: DC

## 2018-10-16 ENCOUNTER — Telehealth: Payer: Self-pay | Admitting: *Deleted

## 2018-10-16 ENCOUNTER — Other Ambulatory Visit: Payer: Self-pay | Admitting: Internal Medicine

## 2018-10-16 NOTE — Telephone Encounter (Signed)

## 2018-10-17 ENCOUNTER — Inpatient Hospital Stay: Admission: RE | Admit: 2018-10-17 | Payer: PRIVATE HEALTH INSURANCE | Source: Ambulatory Visit

## 2018-10-18 ENCOUNTER — Ambulatory Visit (INDEPENDENT_AMBULATORY_CARE_PROVIDER_SITE_OTHER)
Admission: RE | Admit: 2018-10-18 | Discharge: 2018-10-18 | Disposition: A | Discharge status: Home / Self Care | Payer: PRIVATE HEALTH INSURANCE | Source: Ambulatory Visit | Attending: Pulmonary Disease | Admitting: Pulmonary Disease

## 2018-10-18 ENCOUNTER — Other Ambulatory Visit: Payer: Self-pay

## 2018-10-18 DIAGNOSIS — D869 Sarcoidosis, unspecified: Secondary | ICD-10-CM

## 2018-10-23 ENCOUNTER — Telehealth: Payer: Self-pay | Admitting: Pulmonary Disease

## 2018-10-23 DIAGNOSIS — R059 Cough, unspecified: Secondary | ICD-10-CM

## 2018-10-23 DIAGNOSIS — R05 Cough: Secondary | ICD-10-CM

## 2018-10-23 NOTE — Telephone Encounter (Signed)
Please reschedule her with Dr. Valeta Harms. I think his schedule will be opening back up soon. He should have some openings around that time. Thanks.

## 2018-10-23 NOTE — Telephone Encounter (Signed)
PCCM:  I called and spoke with the patient regarding her CT images. All of which are consistent with sarcoidosis. She would like to proceed with bronchoscopy. She is not on any medications that will need to be held. She will need pre-op covid testing.   We will plan for OP bronchoscopy EBUS + Transbronchial Biopsy to be completed at [place] on wednesday morning of July 22nd.  [place], please hold Wednesday AM clinic slots for bronchoscopy.   [facility] 10/23/2018 2:06 PM

## 2018-10-23 NOTE — Telephone Encounter (Signed)
Order placed for Bronch. Message sent to [facility] pool for pre-procedure covid testing.

## 2018-10-23 NOTE — Telephone Encounter (Signed)
Schedule has been held for July 22nd AM clinic.

## 2018-10-23 NOTE — Telephone Encounter (Signed)
Dr. Valeta Harms has opened up his schedule Wednesday, 7/1. Attempted to call pt to see if we could change her appt from her seeing [place] to her seeing Dr. Valeta Harms but unable to reach her. Left message for pt to return call.

## 2018-10-23 NOTE — Telephone Encounter (Signed)
Pt saw BI on 3/18 she had a CT on 6/19. She would like the results of CT before July appt. Pt was scheduled to come back for f/u with BI. Appt has been changed to f/u with NP after PFT due to BI not being in clinic. Pt is not happy about seeing NP if she is not going to able to get answers and still have to f/u with BI. She has had this cough a long time and wants to know what the treatment plan is?

## 2018-10-24 ENCOUNTER — Ambulatory Visit: Payer: PRIVATE HEALTH INSURANCE | Admitting: Pulmonary Disease

## 2018-10-24 NOTE — Telephone Encounter (Signed)
Pre-admission/pre-procedure drive-thru covid -19 testing for [facility] is at [address]. Phone number to schedule is [phone]. The [facility] only schedules community covid testing.

## 2018-10-24 NOTE — Telephone Encounter (Signed)
Bettie Joe to call and schedule. Nothing further is needed at this time.

## 2018-10-25 ENCOUNTER — Other Ambulatory Visit: Payer: Self-pay | Admitting: Pulmonary Disease

## 2018-10-25 NOTE — Telephone Encounter (Signed)
Called and spoke to patient. Scheduled COVID testing for before upcoming PFT and before upcoming bronch.  Nothing further needed at this time.

## 2018-11-01 ENCOUNTER — Other Ambulatory Visit (HOSPITAL_COMMUNITY)
Admission: RE | Admit: 2018-11-01 | Discharge: 2018-11-01 | Disposition: A | Discharge status: Home / Self Care | Payer: BC Managed Care – PPO | Source: Ambulatory Visit | Attending: Pulmonary Disease | Admitting: Pulmonary Disease

## 2018-11-01 DIAGNOSIS — Z1159 Encounter for screening for other viral diseases: Secondary | ICD-10-CM | POA: Insufficient documentation

## 2018-11-01 DIAGNOSIS — Z01812 Encounter for preprocedural laboratory examination: Secondary | ICD-10-CM | POA: Diagnosis not present

## 2018-11-02 LAB — SARS CORONAVIRUS 2 (TAT 6-24 HRS): SARS Coronavirus 2: NEGATIVE

## 2018-11-06 ENCOUNTER — Ambulatory Visit: Payer: PRIVATE HEALTH INSURANCE | Admitting: Pulmonary Disease

## 2018-11-06 ENCOUNTER — Ambulatory Visit (INDEPENDENT_AMBULATORY_CARE_PROVIDER_SITE_OTHER): Payer: BC Managed Care – PPO | Admitting: Pulmonary Disease

## 2018-11-06 ENCOUNTER — Ambulatory Visit: Payer: BC Managed Care – PPO | Admitting: Nurse Practitioner

## 2018-11-06 ENCOUNTER — Other Ambulatory Visit: Payer: Self-pay

## 2018-11-06 ENCOUNTER — Encounter: Payer: Self-pay | Admitting: Nurse Practitioner

## 2018-11-06 VITALS — BP 124/70 | HR 77 | Temp 98.7°F | Ht 64.0 in | Wt 128.4 lb

## 2018-11-06 DIAGNOSIS — D869 Sarcoidosis, unspecified: Secondary | ICD-10-CM

## 2018-11-06 DIAGNOSIS — R0602 Shortness of breath: Secondary | ICD-10-CM

## 2018-11-06 LAB — PULMONARY FUNCTION TEST
DL/VA % pred: 124 %
DL/VA: 5.21 ml/min/mmHg/L
DLCO unc % pred: 100 %
DLCO unc: 20.13 ml/min/mmHg
FEF 25-75 Post: 1.57 L/sec
FEF 25-75 Pre: 1.55 L/sec
FEF2575-%Change-Post: 1 %
FEF2575-%Pred-Post: 69 %
FEF2575-%Pred-Pre: 68 %
FEV1-%Change-Post: -4 %
FEV1-%Pred-Post: 81 %
FEV1-%Pred-Pre: 85 %
FEV1-Post: 2.04 L
FEV1-Pre: 2.13 L
FEV1FVC-%Change-Post: -6 %
FEV1FVC-%Pred-Pre: 95 %
FEV6-%Change-Post: 2 %
FEV6-%Pred-Post: 93 %
FEV6-%Pred-Pre: 91 %
FEV6-Post: 2.92 L
FEV6-Pre: 2.86 L
FEV6FVC-%Change-Post: 0 %
FEV6FVC-%Pred-Post: 103 %
FEV6FVC-%Pred-Pre: 103 %
FVC-%Change-Post: 2 %
FVC-%Pred-Post: 89 %
FVC-%Pred-Pre: 88 %
FVC-Post: 2.92 L
FVC-Pre: 2.86 L
Post FEV1/FVC ratio: 70 %
Post FEV6/FVC ratio: 100 %
Pre FEV1/FVC ratio: 74 %
Pre FEV6/FVC Ratio: 100 %
RV % pred: 94 %
RV: 1.92 L
TLC % pred: 93 %
TLC: 4.74 L

## 2018-11-06 NOTE — H&P (View-Only) (Signed)
@Patient  ID: Tamara Myers, female    DOB: Jun 28, 1955, 63 y.o.   MRN: [id number]  Chief Complaint  Patient presents with   Results    Discuss PFT Results    Referring provider: Elby Showers, MD  HPI 63 year old female with sarcoid who is followed by Dr. Valeta Harms  Tests:  Chest Imaging:  CT chest 10/18/18 - Mildly progressive patchy nodularity in both lungs, as described above compatible with the clinical diagnosis of sarcoidosis. Stable 8 mm right lower lobe dominant nodule. This can be re-evaluated with a repeat noncontrast CT in 12 months. Minimal pericardial effusion. Mild calcific coronary artery and aortic atherosclerosis.  CT chest 07/03/2018: Evidence of peri-lymphatic bilateral micro-nodularity consistent with sarcoidosis, small mediastinal and hilar lymph nodes.  And a right lower lobe 1 cm nodule likely consistent with the sarcoid however recommending 3 to 87-month follow-up by radiology.   PFT Results Latest Ref Rng & Units 11/06/2018 06/27/2018  FVC-Pre L 2.86 2.82  FVC-Predicted Pre % 88 86  FVC-Post L 2.92 2.82  FVC-Predicted Post % 89 86  Pre FEV1/FVC % % 74 65  Post FEV1/FCV % % 70 76  FEV1-Pre L 2.13 1.83  FEV1-Predicted Pre % 85 73  FEV1-Post L 2.04 2.13  DLCO UNC% % 100 99  DLCO COR %Predicted % 124 109  TLC L 4.74 4.57  TLC % Predicted % 93 90  RV % Predicted % 94 80    OV 11/06/18 - follow up Patient presents today for follow-up visit.  Tamara Myers states that since her last visit with Dr. Valeta Harms Tamara Myers has been having increased shortness of breath and increased cough.  Her PFT today in office does look slightly improved from last PFT.  Recent CT showed mildly progressive patchy nodularity in both lungs.  Stable 8 mm right lower lobe dominant nodule.  Dr. Valeta Harms has called patient and discussed this with her and has scheduled a bronchoscopy.  Patient states that Tamara Myers has had intermittent chest pressure over the past 3 months.  EKG in office today showed normal sinus  rhythm.  Patient states that Tamara Myers does have anxiety at times and thinks that the chest pressure is related to this. Denies f/c/s, n/v/d, hemoptysis, PND, leg swelling.     No Known Allergies  Immunization History  Administered Date(s) Administered   Influenza Inj Mdck Quad Pf 02/13/2014   Influenza Split 02/13/2013   Influenza, Seasonal, Injecte, Preservative Fre 02/13/2013   Influenza,inj,Quad PF,6+ Mos 12/21/2016, 12/25/2017   Influenza,trivalent, recombinat, inj, PF 02/09/2011, 02/15/2012   Tdap 03/16/2009    Past Medical History:  Diagnosis Date   Cancer ([place])    basal cell carcinoma   Complication of anesthesia    slow to wake up   Hypertension    Moderate persistent asthma without complication 11/30/4479   Post menopausal syndrome     Tobacco History: Social History   Tobacco Use  Smoking Status Never Smoker  Smokeless Tobacco Never Used   Counseling given: Not Answered   Outpatient Encounter Medications as of 11/06/2018  Medication Sig   albuterol (PROVENTIL HFA;VENTOLIN HFA) 108 (90 Base) MCG/ACT inhaler Inhale 2 puffs into the lungs every 6 (six) hours as needed for wheezing or shortness of breath.   amLODipine (NORVASC) 5 MG tablet TAKE 1 TABLET BY MOUTH EVERY DAY (Patient taking differently: Take 5 mg by mouth daily. )   Cholecalciferol (VITAMIN D3) 10 MCG (400 UNIT) CAPS Take 400 Units by mouth 2 (two) times a day.  Fluticasone-Salmeterol (WIXELA INHUB) 250-50 MCG/DOSE AEPB Inhale 1 puff into the lungs 2 (two) times daily.   gabapentin (NEURONTIN) 300 MG capsule Take 300 mg by mouth at bedtime.    pantoprazole (PROTONIX) 40 MG tablet Take 1 tablet (40 mg total) by mouth 2 (two) times daily.   rosuvastatin (CRESTOR) 5 MG tablet TAKE 1 TABLET BY MOUTH EVERY DAY (Patient taking differently: Take 5 mg by mouth daily. )   triamterene-hydrochlorothiazide (MAXZIDE-25) 37.5-25 MG tablet TAKE 1 TABLET BY MOUTH EVERY DAY (Patient taking differently:  Take 1 tablet by mouth daily. )   No facility-administered encounter medications on file as of 11/06/2018.      Review of Systems  Review of Systems  Constitutional: Negative.  Negative for chills and fever.  HENT: Negative.   Respiratory: Positive for cough and shortness of breath. Negative for wheezing.   Cardiovascular: Negative.  Negative for chest pain, palpitations and leg swelling.  Gastrointestinal: Negative.   Allergic/Immunologic: Negative.   Neurological: Negative.   Psychiatric/Behavioral: Negative.        Physical Exam  BP 124/70 (BP Location: Left Arm, Patient Position: Sitting, Cuff Size: Normal)    Pulse 77    Temp 98.7 F (37.1 C)    Ht 5\' 4"  (1.626 m)    Wt 128 lb 6.4 oz (58.2 kg)    SpO2 96%    BMI 22.04 kg/m   Wt Readings from Last 5 Encounters:  11/06/18 128 lb 6.4 oz (58.2 kg)  07/17/18 130 lb 12.8 oz (59.3 kg)  07/04/18 131 lb (59.4 kg)  04/26/18 131 lb (59.4 kg)  02/13/18 132 lb 3.2 oz (60 kg)     Physical Exam Vitals signs and nursing note reviewed.  Constitutional:      General: Tamara Myers is not in acute distress.    Appearance: Tamara Myers is well-developed.  Cardiovascular:     Rate and Rhythm: Normal rate and regular rhythm.  Pulmonary:     Effort: Pulmonary effort is normal. No respiratory distress.     Breath sounds: Normal breath sounds. No wheezing or rhonchi.  Musculoskeletal:        General: No swelling.  Neurological:     Mental Status: Tamara Myers is alert and oriented to person, place, and time.     Imaging: Ct Chest Wo Contrast  Result Date: 10/18/2018 CLINICAL DATA:  Follow-up 1 cm dominant nodule in the right lower lobe with additional bilateral perilymphatic micronodularity felt most likely due to pulmonary sarcoid on a chest CT dated 07/02/2018. The patient has a diagnosis of sarcoidosis. EXAM: CT CHEST WITHOUT CONTRAST TECHNIQUE: Multidetector CT imaging of the chest was performed following the standard protocol without IV contrast.  COMPARISON:  07/02/2018. FINDINGS: Cardiovascular: Minimal pericardial effusion with a maximum thickness of 4 mm. Normal sized heart. Small amount of aortic and coronary artery calcification. Mediastinum/Nodes: The previously demonstrated 10 mm short axis precarinal node has a short axis diameter of 6 mm on image number 61 series 2 today. An 8 mm short axis pretracheal lymph node on image number 57 series 2 is unchanged. The previously demonstrated 14 mm short axis right axillary lymph node has a short axis diameter of approximately 13 mm today on image number 65 series 2, difficult to measure due to the lack of intravenous contrast. No enlarged axillary lymph nodes. Thyroid gland, trachea, and esophagus demonstrate no significant findings. Lungs/Pleura: Mildly progressive patchy nodularity in both lungs, as previously described. The previously demonstrated 1.0 cm dominant nodule in the anterior right  lower lobe is unchanged, measuring 8 mm in maximum diameter on image number 75 series 3, previously 8 mm in corresponding diameter. No pleural fluid. Upper Abdomen: Unremarkable. Musculoskeletal: Thoracic spine degenerative changes. IMPRESSION: 1. Mildly progressive patchy nodularity in both lungs, as described above compatible with the clinical diagnosis of sarcoidosis. 2. Stable 8 mm right lower lobe dominant nodule. This can be re-evaluated with a repeat noncontrast CT in 12 months. 3. Minimal pericardial effusion. 4. Mild calcific coronary artery and aortic atherosclerosis. Aortic Atherosclerosis (ICD10-I70.0). Electronically Signed   By: Claudie Revering M.D.   On: 10/18/2018 14:54     Assessment & Plan:   Sarcoidosis Patient presents today for follow-up visit.  Tamara Myers states that since her last visit with Dr. Valeta Harms Tamara Myers has been having increased shortness of breath and increased cough.  Her PFT today in office does look slightly improved from last PFT.  Recent CT showed mildly progressive patchy nodularity in both  lungs.  Stable 8 mm right lower lobe dominant nodule.  Dr. Valeta Harms has called patient and discussed this with her and has scheduled a bronchoscopy.  Patient states that Tamara Myers has had intermittent chest pressure over the past 3 months.  EKG in office today showed normal sinus rhythm.  Patient states that Tamara Myers does have anxiety at times and thinks that the chest pressure is related to this.  Patient Instructions  PFT today showed slight improvement since previous Dr. Valeta Harms has already discussed CT scan with patient and has scheduled Bronch EKG in office today showed NSR  Follow up: Follow up with Dr. Valeta Harms after Lourdes Sledge, NP 11/06/2018

## 2018-11-06 NOTE — Progress Notes (Signed)
Full PFT performed today. °

## 2018-11-06 NOTE — Patient Instructions (Addendum)
PFT today showed slight improvement since previous Dr. Valeta Harms has already discussed CT scan with patient and has scheduled Bronch EKG in office today showed NSR  Follow up: Follow up with Dr. Valeta Harms after Charleen Kirks

## 2018-11-06 NOTE — Assessment & Plan Note (Signed)
Patient presents today for follow-up visit.  She states that since her last visit with Dr. Valeta Harms she has been having increased shortness of breath and increased cough.  Her PFT today in office does look slightly improved from last PFT.  Recent CT showed mildly progressive patchy nodularity in both lungs.  Stable 8 mm right lower lobe dominant nodule.  Dr. Valeta Harms has called patient and discussed this with her and has scheduled a bronchoscopy.  Patient states that she has had intermittent chest pressure over the past 3 months.  EKG in office today showed normal sinus rhythm.  Patient states that she does have anxiety at times and thinks that the chest pressure is related to this.  Patient Instructions  PFT today showed slight improvement since previous Dr. Valeta Harms has already discussed CT scan with patient and has scheduled Bronch EKG in office today showed NSR  Follow up: Follow up with Dr. Valeta Harms after Charleen Kirks

## 2018-11-06 NOTE — Progress Notes (Signed)
@Patient  ID: Tamara Myers, female    DOB: [birth year], 63 y.o.   MRN: [id number]  Chief Complaint  Patient presents with   Results    Discuss PFT Results    Referring provider: Elby Showers, MD  HPI 63 year old female with sarcoid who is followed by Dr. Valeta Harms  Tests:  Chest Imaging:  CT chest 10/18/18 - Mildly progressive patchy nodularity in both lungs, as described above compatible with the clinical diagnosis of sarcoidosis. Stable 8 mm right lower lobe dominant nodule. This can be re-evaluated with a repeat noncontrast CT in 12 months. Minimal pericardial effusion. Mild calcific coronary artery and aortic atherosclerosis.  CT chest 07/03/2018: Evidence of peri-lymphatic bilateral micro-nodularity consistent with sarcoidosis, small mediastinal and hilar lymph nodes.  And a right lower lobe 1 cm nodule likely consistent with the sarcoid however recommending 3 to 102-month follow-up by radiology.   PFT Results Latest Ref Rng & Units 11/06/2018 06/27/2018  FVC-Pre L 2.86 2.82  FVC-Predicted Pre % 88 86  FVC-Post L 2.92 2.82  FVC-Predicted Post % 89 86  Pre FEV1/FVC % % 74 65  Post FEV1/FCV % % 70 76  FEV1-Pre L 2.13 1.83  FEV1-Predicted Pre % 85 73  FEV1-Post L 2.04 2.13  DLCO UNC% % 100 99  DLCO COR %Predicted % 124 109  TLC L 4.74 4.57  TLC % Predicted % 93 90  RV % Predicted % 94 80    OV 11/06/18 - follow up Patient presents today for follow-up visit.  She states that since her last visit with Dr. Valeta Harms she has been having increased shortness of breath and increased cough.  Her PFT today in office does look slightly improved from last PFT.  Recent CT showed mildly progressive patchy nodularity in both lungs.  Stable 8 mm right lower lobe dominant nodule.  Dr. Valeta Harms has called patient and discussed this with her and has scheduled a bronchoscopy.  Patient states that she has had intermittent chest pressure over the past 3 months.  EKG in office today showed normal sinus  rhythm.  Patient states that she does have anxiety at times and thinks that the chest pressure is related to this. Denies f/c/s, n/v/d, hemoptysis, PND, leg swelling.     No Known Allergies  Immunization History  Administered Date(s) Administered   Influenza Inj Mdck Quad Pf 02/13/2014   Influenza Split 02/13/2013   Influenza, Seasonal, Injecte, Preservative Fre 02/13/2013   Influenza,inj,Quad PF,6+ Mos 12/21/2016, 12/25/2017   Influenza,trivalent, recombinat, inj, PF 02/09/2011, 02/15/2012   Tdap 03/16/2009    Past Medical History:  Diagnosis Date   Cancer ([place])    basal cell carcinoma   Complication of anesthesia    slow to wake up   Hypertension    Moderate persistent asthma without complication 06/07/2534   Post menopausal syndrome     Tobacco History: Social History   Tobacco Use  Smoking Status Never Smoker  Smokeless Tobacco Never Used   Counseling given: Not Answered   Outpatient Encounter Medications as of 11/06/2018  Medication Sig   albuterol (PROVENTIL HFA;VENTOLIN HFA) 108 (90 Base) MCG/ACT inhaler Inhale 2 puffs into the lungs every 6 (six) hours as needed for wheezing or shortness of breath.   amLODipine (NORVASC) 5 MG tablet TAKE 1 TABLET BY MOUTH EVERY DAY (Patient taking differently: Take 5 mg by mouth daily. )   Cholecalciferol (VITAMIN D3) 10 MCG (400 UNIT) CAPS Take 400 Units by mouth 2 (two) times a day.  Fluticasone-Salmeterol (WIXELA INHUB) 250-50 MCG/DOSE AEPB Inhale 1 puff into the lungs 2 (two) times daily.   gabapentin (NEURONTIN) 300 MG capsule Take 300 mg by mouth at bedtime.    pantoprazole (PROTONIX) 40 MG tablet Take 1 tablet (40 mg total) by mouth 2 (two) times daily.   rosuvastatin (CRESTOR) 5 MG tablet TAKE 1 TABLET BY MOUTH EVERY DAY (Patient taking differently: Take 5 mg by mouth daily. )   triamterene-hydrochlorothiazide (MAXZIDE-25) 37.5-25 MG tablet TAKE 1 TABLET BY MOUTH EVERY DAY (Patient taking differently:  Take 1 tablet by mouth daily. )   No facility-administered encounter medications on file as of 11/06/2018.      Review of Systems  Review of Systems  Constitutional: Negative.  Negative for chills and fever.  HENT: Negative.   Respiratory: Positive for cough and shortness of breath. Negative for wheezing.   Cardiovascular: Negative.  Negative for chest pain, palpitations and leg swelling.  Gastrointestinal: Negative.   Allergic/Immunologic: Negative.   Neurological: Negative.   Psychiatric/Behavioral: Negative.        Physical Exam  BP 124/70 (BP Location: Left Arm, Patient Position: Sitting, Cuff Size: Normal)    Pulse 77    Temp 98.7 F (37.1 C)    Ht 5\' 4"  (1.626 m)    Wt 128 lb 6.4 oz (58.2 kg)    SpO2 96%    BMI 22.04 kg/m   Wt Readings from Last 5 Encounters:  11/06/18 128 lb 6.4 oz (58.2 kg)  07/17/18 130 lb 12.8 oz (59.3 kg)  07/04/18 131 lb (59.4 kg)  04/26/18 131 lb (59.4 kg)  02/13/18 132 lb 3.2 oz (60 kg)     Physical Exam Vitals signs and nursing note reviewed.  Constitutional:      General: She is not in acute distress.    Appearance: She is well-developed.  Cardiovascular:     Rate and Rhythm: Normal rate and regular rhythm.  Pulmonary:     Effort: Pulmonary effort is normal. No respiratory distress.     Breath sounds: Normal breath sounds. No wheezing or rhonchi.  Musculoskeletal:        General: No swelling.  Neurological:     Mental Status: She is alert and oriented to person, place, and time.     Imaging: Ct Chest Wo Contrast  Result Date: 10/18/2018 CLINICAL DATA:  Follow-up 1 cm dominant nodule in the right lower lobe with additional bilateral perilymphatic micronodularity felt most likely due to pulmonary sarcoid on a chest CT dated 07/02/2018. The patient has a diagnosis of sarcoidosis. EXAM: CT CHEST WITHOUT CONTRAST TECHNIQUE: Multidetector CT imaging of the chest was performed following the standard protocol without IV contrast.  COMPARISON:  07/02/2018. FINDINGS: Cardiovascular: Minimal pericardial effusion with a maximum thickness of 4 mm. Normal sized heart. Small amount of aortic and coronary artery calcification. Mediastinum/Nodes: The previously demonstrated 10 mm short axis precarinal node has a short axis diameter of 6 mm on image number 61 series 2 today. An 8 mm short axis pretracheal lymph node on image number 57 series 2 is unchanged. The previously demonstrated 14 mm short axis right axillary lymph node has a short axis diameter of approximately 13 mm today on image number 65 series 2, difficult to measure due to the lack of intravenous contrast. No enlarged axillary lymph nodes. Thyroid gland, trachea, and esophagus demonstrate no significant findings. Lungs/Pleura: Mildly progressive patchy nodularity in both lungs, as previously described. The previously demonstrated 1.0 cm dominant nodule in the anterior right  lower lobe is unchanged, measuring 8 mm in maximum diameter on image number 75 series 3, previously 8 mm in corresponding diameter. No pleural fluid. Upper Abdomen: Unremarkable. Musculoskeletal: Thoracic spine degenerative changes. IMPRESSION: 1. Mildly progressive patchy nodularity in both lungs, as described above compatible with the clinical diagnosis of sarcoidosis. 2. Stable 8 mm right lower lobe dominant nodule. This can be re-evaluated with a repeat noncontrast CT in 12 months. 3. Minimal pericardial effusion. 4. Mild calcific coronary artery and aortic atherosclerosis. Aortic Atherosclerosis (ICD10-I70.0). Electronically Signed   By: Claudie Revering M.D.   On: 10/18/2018 14:54     Assessment & Plan:   Sarcoidosis Patient presents today for follow-up visit.  She states that since her last visit with Dr. Valeta Harms she has been having increased shortness of breath and increased cough.  Her PFT today in office does look slightly improved from last PFT.  Recent CT showed mildly progressive patchy nodularity in both  lungs.  Stable 8 mm right lower lobe dominant nodule.  Dr. Valeta Harms has called patient and discussed this with her and has scheduled a bronchoscopy.  Patient states that she has had intermittent chest pressure over the past 3 months.  EKG in office today showed normal sinus rhythm.  Patient states that she does have anxiety at times and thinks that the chest pressure is related to this.  Patient Instructions  PFT today showed slight improvement since previous Dr. Valeta Harms has already discussed CT scan with patient and has scheduled Bronch EKG in office today showed NSR  Follow up: Follow up with Dr. Valeta Harms after Lourdes Sledge, NP 11/06/2018

## 2018-11-07 ENCOUNTER — Ambulatory Visit
Admission: RE | Admit: 2018-11-07 | Discharge: 2018-11-07 | Disposition: A | Discharge status: Home / Self Care | Payer: BC Managed Care – PPO | Source: Ambulatory Visit | Attending: Internal Medicine | Admitting: Internal Medicine

## 2018-11-07 DIAGNOSIS — Z1231 Encounter for screening mammogram for malignant neoplasm of breast: Secondary | ICD-10-CM | POA: Diagnosis not present

## 2018-11-07 NOTE — Progress Notes (Signed)
PCCM: Thanks for seeing her.  Bronchoscopy has been scheduled  Garner Nash, DO [facility] 11/07/2018 1:58 PM

## 2018-11-12 ENCOUNTER — Other Ambulatory Visit: Payer: Self-pay | Admitting: Pulmonary Disease

## 2018-11-12 NOTE — Pre-Procedure Instructions (Signed)
[place]  11/12/2018      [id number] - [address] [place] [address] Phone: [phone] Fax: [phone]    Your procedure is scheduled on Wed., November 20, 2018 from 7:30AM-9:10AM  Report to [facility] Entrance "[facility]" at 5:30AM  Call this number if you have problems the morning of surgery:  [phone]   Remember:  Do not eat or drink after midnight on July 21st    Take these medicines the morning of surgery with A SIP OF WATER: AmLODipine (NORVASC), Fluticasone-Salmeterol (WIXELA INHUB), Pantoprazole (PROTONIX),  Rosuvastatin (CRESTOR)   If needed: Albuterol Inhaler-bring with the day of surgery  As of today, stop taking all Aspirin (unless instructed by your doctor) and Other Aspirin containing products, Vitamins, Fish oils, and Herbal medications. Also stop all NSAIDS i.e. Advil, Ibuprofen, Motrin, Aleve, Anaprox, Naproxen, BC, Goody Powders, and all Supplements.   Special instructions:   [facility]- Preparing For Surgery  Before surgery, you can play an important role. Because skin is not sterile, your skin needs to be as free of germs as possible. You can reduce the number of germs on your skin by washing with CHG (chlorahexidine gluconate) Soap before surgery.  CHG is an antiseptic cleaner which kills germs and bonds with the skin to continue killing germs even after washing.    Please do not use if you have an allergy to CHG or antibacterial soaps. If your skin becomes reddened/irritated stop using the CHG.  Do not shave (including legs and underarms) for at least 48 hours prior to first CHG shower. It is OK to shave your face.  Please follow these instructions carefully.   1. Shower the NIGHT BEFORE SURGERY and the MORNING OF SURGERY with CHG.   2. If you chose to wash your hair, wash your hair first as usual with your normal shampoo.  3. After you shampoo, rinse your hair and body thoroughly to remove  the shampoo.  4. Use CHG as you would any other liquid soap. You can apply CHG directly to the skin and wash gently with a scrungie or a clean washcloth.   5. Apply the CHG Soap to your body ONLY FROM THE NECK DOWN.  Do not use on open wounds or open sores. Avoid contact with your eyes, ears, mouth and genitals (private parts). Wash Face and genitals (private parts)  with your normal soap.  6. Wash thoroughly, paying special attention to the area where your surgery will be performed.  7. Thoroughly rinse your body with warm water from the neck down.  8. DO NOT shower/wash with your normal soap after using and rinsing off the CHG Soap.  9. Pat yourself dry with a CLEAN TOWEL.  10. Wear CLEAN PAJAMAS to bed the night before surgery, wear comfortable clothes the morning of surgery  11. Place CLEAN SHEETS on your bed the night of your first shower and DO NOT SLEEP WITH PETS.   Day of Surgery:   Oral Hygiene is also important to reduce your risk of infection.  Remember - BRUSH YOUR TEETH THE MORNING OF SURGERY WITH YOUR REGULAR TOOTHPASTE   Do not wear jewelry, make-up or nail polish.  Do not wear lotions, powders, or perfumes, or deodorant.  Do not shave 48 hours prior to surgery.    Do not bring valuables to the hospital.  [facility] is not responsible for any belongings or valuables.  Contacts, dentures or bridgework  may not be worn into surgery.    For patients admitted to the hospital, discharge time will be determined by your treatment team.  Patients discharged the day of surgery will not be allowed to drive home.   Please wear clean clothes to the hospital/surgery center.     Please read over the following fact sheets that you were given. Pain Booklet, Coughing and Deep Breathing and Surgical Site Infection Prevention

## 2018-11-13 ENCOUNTER — Other Ambulatory Visit: Payer: Self-pay

## 2018-11-13 ENCOUNTER — Encounter (HOSPITAL_COMMUNITY)
Admission: RE | Admit: 2018-11-13 | Discharge: 2018-11-13 | Disposition: A | Discharge status: Home / Self Care | Payer: BC Managed Care – PPO | Source: Ambulatory Visit | Attending: Pulmonary Disease | Admitting: Pulmonary Disease

## 2018-11-13 ENCOUNTER — Encounter (HOSPITAL_COMMUNITY): Payer: Self-pay

## 2018-11-13 VITALS — BP 135/79 | HR 77 | Temp 98.6°F | Resp 18 | Ht 64.0 in | Wt 127.7 lb

## 2018-11-13 DIAGNOSIS — Z01812 Encounter for preprocedural laboratory examination: Secondary | ICD-10-CM | POA: Diagnosis not present

## 2018-11-13 DIAGNOSIS — Z01818 Encounter for other preprocedural examination: Secondary | ICD-10-CM

## 2018-11-13 HISTORY — DX: Gastro-esophageal reflux disease without esophagitis: K21.9

## 2018-11-13 HISTORY — DX: Nausea with vomiting, unspecified: Z98.890

## 2018-11-13 HISTORY — DX: Dyspnea, unspecified: R06.00

## 2018-11-13 HISTORY — DX: Nausea with vomiting, unspecified: R11.2

## 2018-11-13 LAB — CBC
HCT: 44.2 % (ref 36.0–46.0)
Hemoglobin: 14.7 g/dL (ref 12.0–15.0)
MCH: 29.9 pg (ref 26.0–34.0)
MCHC: 33.3 g/dL (ref 30.0–36.0)
MCV: 89.8 fL (ref 80.0–100.0)
Platelets: 228 10*3/uL (ref 150–400)
RBC: 4.92 MIL/uL (ref 3.87–5.11)
RDW: 12 % (ref 11.5–15.5)
WBC: 3 10*3/uL — ABNORMAL LOW (ref 4.0–10.5)
nRBC: 0 % (ref 0.0–0.2)

## 2018-11-13 LAB — BASIC METABOLIC PANEL
Anion gap: 9 (ref 5–15)
BUN: 9 mg/dL (ref 8–23)
CO2: 29 mmol/L (ref 22–32)
Calcium: 9.7 mg/dL (ref 8.9–10.3)
Chloride: 103 mmol/L (ref 98–111)
Creatinine, Ser: 0.85 mg/dL (ref 0.44–1.00)
GFR calc Af Amer: >60 mL/min (ref >60)
GFR calc non Af Amer: >60 mL/min (ref >60)
Glucose, Bld: 95 mg/dL (ref 70–99)
Potassium: 3.5 mmol/L (ref 3.5–5.1)
Sodium: 141 mmol/L (ref 135–145)

## 2018-11-13 NOTE — Progress Notes (Signed)
PCP - [place]ra [place] Cardiologist - na  Chest x-ray - 03/06/19 EKG - 11/06/18 Stress Test - na ECHO -  na Cardiac Cath - na  Sleep Study - na CPAP -   Fasting Blood Sugar - na Checks Blood Sugar _____ times a day  Blood Thinner Instructions:na Aspirin Instructions:  Anesthesia review:   Patient denies shortness of breath, fever, cough and chest pain at PAT appointment   Patient verbalized understanding of instructions that were given to them at the PAT appointment. Patient was also instructed that they will need to review over the PAT instructions again at home before surgery.

## 2018-11-16 ENCOUNTER — Other Ambulatory Visit (HOSPITAL_COMMUNITY)
Admission: RE | Admit: 2018-11-16 | Discharge: 2018-11-16 | Disposition: A | Discharge status: Home / Self Care | Payer: BC Managed Care – PPO | Source: Ambulatory Visit | Attending: Pulmonary Disease | Admitting: Pulmonary Disease

## 2018-11-16 DIAGNOSIS — Z1159 Encounter for screening for other viral diseases: Secondary | ICD-10-CM | POA: Diagnosis not present

## 2018-11-16 LAB — SARS CORONAVIRUS 2 (TAT 6-24 HRS): SARS Coronavirus 2: NEGATIVE

## 2018-11-19 ENCOUNTER — Other Ambulatory Visit: Payer: Self-pay

## 2018-11-19 MED ORDER — FLUTICASONE-SALMETEROL 250-50 MCG/DOSE IN AEPB: 1.0000 | INHALATION_SPRAY | Freq: Two times a day (BID) | RESPIRATORY_TRACT | 5 refills | Status: DC

## 2018-11-19 NOTE — Anesthesia Preprocedure Evaluation (Addendum)
Anesthesia Evaluation    Reviewed: Allergy & Precautions, Patient's Chart, lab work & pertinent test results  History of Anesthesia Complications (+) PONV and history of anesthetic complications  Airway Mallampati: I  TM Distance: >3 FB Neck ROM: Full    Dental no notable dental hx. (+) Teeth Intact, Dental Advisory Given   Pulmonary shortness of breath and with exertion, asthma ,  H/o sarcoidosis  CT chest 10/18/18 - Mildly progressive patchy nodularity in both lungs, as described above compatible with the clinical diagnosis of sarcoidosis. Stable 8 mm right lower lobe dominant nodule. This can be re-evaluated with a repeat noncontrast CT in 12 months. Minimal pericardial effusion. Mild calcific coronary artery and aortic atherosclerosis.  CT chest 07/03/2018: Evidence of peri-lymphatic bilateral micro-nodularity consistent with sarcoidosis, small mediastinal and hilar lymph nodes. And a right lower lobe 1 cm nodule likely consistent with the sarcoid however recommending 3 to 39-month follow-up by radiology   Pulmonary exam normal breath sounds clear to auscultation       Cardiovascular hypertension, negative cardio ROS Normal cardiovascular exam Rhythm:Regular Rate:Normal     Neuro/Psych negative neurological ROS  negative psych ROS   GI/Hepatic Neg liver ROS, GERD  Medicated,  Endo/Other  negative endocrine ROS  Renal/GU negative Renal ROS  negative genitourinary   Musculoskeletal negative musculoskeletal ROS (+)   Abdominal   Peds  Hematology negative hematology ROS (+)   Anesthesia Other Findings   Reproductive/Obstetrics                            Anesthesia Physical Anesthesia Plan  ASA: III  Anesthesia Plan: General   Post-op Pain Management:    Induction: Intravenous  PONV Risk Score and Plan: 4 or greater and Ondansetron, Dexamethasone, Midazolam, Treatment may vary due to  age or medical condition and Scopolamine patch - Pre-op  Airway Management Planned: Oral ETT  Additional Equipment:   Intra-op Plan:   Post-operative Plan: Extubation in OR  Informed Consent: I have reviewed the patients History and Physical, chart, labs and discussed the procedure including the risks, benefits and alternatives for the proposed anesthesia with the patient or authorized representative who has indicated his/her understanding and acceptance.     Dental advisory given  Plan Discussed with: CRNA  Anesthesia Plan Comments:        Anesthesia Quick Evaluation

## 2018-11-20 ENCOUNTER — Other Ambulatory Visit: Payer: Self-pay

## 2018-11-20 ENCOUNTER — Encounter (HOSPITAL_COMMUNITY)
Admission: RE | Disposition: A | Discharge status: Home / Self Care | Payer: Self-pay | Source: Home / Self Care | Attending: Pulmonary Disease

## 2018-11-20 ENCOUNTER — Ambulatory Visit (HOSPITAL_COMMUNITY)
Admission: RE | Admit: 2018-11-20 | Discharge: 2018-11-20 | Disposition: A | Discharge status: Home / Self Care | Payer: BC Managed Care – PPO | Attending: Pulmonary Disease | Admitting: Pulmonary Disease

## 2018-11-20 ENCOUNTER — Ambulatory Visit (HOSPITAL_COMMUNITY): Payer: BC Managed Care – PPO | Admitting: Anesthesiology

## 2018-11-20 ENCOUNTER — Encounter (HOSPITAL_COMMUNITY): Payer: Self-pay

## 2018-11-20 ENCOUNTER — Ambulatory Visit (HOSPITAL_COMMUNITY): Payer: BC Managed Care – PPO

## 2018-11-20 ENCOUNTER — Other Ambulatory Visit: Payer: Self-pay | Admitting: Pulmonary Disease

## 2018-11-20 ENCOUNTER — Telehealth: Payer: Self-pay

## 2018-11-20 ENCOUNTER — Telehealth: Payer: Self-pay | Admitting: Pulmonary Disease

## 2018-11-20 DIAGNOSIS — Z9889 Other specified postprocedural states: Secondary | ICD-10-CM

## 2018-11-20 DIAGNOSIS — J189 Pneumonia, unspecified organism: Secondary | ICD-10-CM | POA: Diagnosis not present

## 2018-11-20 DIAGNOSIS — D869 Sarcoidosis, unspecified: Secondary | ICD-10-CM | POA: Diagnosis not present

## 2018-11-20 DIAGNOSIS — Z85828 Personal history of other malignant neoplasm of skin: Secondary | ICD-10-CM | POA: Diagnosis not present

## 2018-11-20 DIAGNOSIS — J4540 Moderate persistent asthma, uncomplicated: Secondary | ICD-10-CM | POA: Diagnosis not present

## 2018-11-20 DIAGNOSIS — Z79899 Other long term (current) drug therapy: Secondary | ICD-10-CM | POA: Insufficient documentation

## 2018-11-20 DIAGNOSIS — R918 Other nonspecific abnormal finding of lung field: Secondary | ICD-10-CM | POA: Diagnosis not present

## 2018-11-20 DIAGNOSIS — I2510 Atherosclerotic heart disease of native coronary artery without angina pectoris: Secondary | ICD-10-CM | POA: Insufficient documentation

## 2018-11-20 DIAGNOSIS — I10 Essential (primary) hypertension: Secondary | ICD-10-CM | POA: Insufficient documentation

## 2018-11-20 DIAGNOSIS — K219 Gastro-esophageal reflux disease without esophagitis: Secondary | ICD-10-CM | POA: Diagnosis not present

## 2018-11-20 DIAGNOSIS — R590 Localized enlarged lymph nodes: Secondary | ICD-10-CM | POA: Insufficient documentation

## 2018-11-20 DIAGNOSIS — I700 Atherosclerosis of aorta: Secondary | ICD-10-CM | POA: Diagnosis not present

## 2018-11-20 HISTORY — PX: VIDEO BRONCHOSCOPY WITH ENDOBRONCHIAL ULTRASOUND: SHX6177

## 2018-11-20 HISTORY — PX: VIDEO BRONCHOSCOPY: SHX5072

## 2018-11-20 LAB — PROTIME-INR
INR: 1 (ref 0.8–1.2)
Prothrombin Time: 13 seconds (ref 11.4–15.2)

## 2018-11-20 LAB — BODY FLUID CELL COUNT WITH DIFFERENTIAL
Eos, Fluid: 1 %
Lymphs, Fluid: 62 %
Lymphs, Fluid: 78 %
Monocyte-Macrophage-Serous Fluid: 35 % — ABNORMAL LOW (ref 50–90)
Monocyte-Macrophage-Serous Fluid: 20 % — ABNORMAL LOW (ref 50–90)
Neutrophil Count, Fluid: 2 % (ref 0–25)
Neutrophil Count, Fluid: 2 % (ref 0–25)
Total Nucleated Cell Count, Fluid: 63 cu mm (ref 0–1000)
Total Nucleated Cell Count, Fluid: 157 cu mm (ref 0–1000)

## 2018-11-20 LAB — APTT: aPTT: 29 seconds (ref 24–36)

## 2018-11-20 SURGERY — BRONCHOSCOPY, WITH EBUS
Anesthesia: General | Site: Bronchus

## 2018-11-20 MED ORDER — SCOPOLAMINE 1 MG/3DAYS TD PT72
MEDICATED_PATCH | TRANSDERMAL | Status: DC | PRN
  Administered 2018-11-20: 1 via TRANSDERMAL

## 2018-11-20 MED ORDER — FENTANYL CITRATE (PF) 250 MCG/5ML IJ SOLN
INTRAMUSCULAR | Status: AC
  Filled 2018-11-20: qty 5

## 2018-11-20 MED ORDER — LIDOCAINE 2% (20 MG/ML) 5 ML SYRINGE
INTRAMUSCULAR | Status: AC
  Filled 2018-11-20: qty 10

## 2018-11-20 MED ORDER — EPHEDRINE SULFATE 50 MG/ML IJ SOLN
INTRAMUSCULAR | Status: DC | PRN
  Administered 2018-11-20: 10 mg via INTRAVENOUS

## 2018-11-20 MED ORDER — PROPOFOL 10 MG/ML IV BOLUS
INTRAVENOUS | Status: DC | PRN
  Administered 2018-11-20: 150 mg via INTRAVENOUS

## 2018-11-20 MED ORDER — EPHEDRINE 5 MG/ML INJ
INTRAVENOUS | Status: AC
  Filled 2018-11-20: qty 20

## 2018-11-20 MED ORDER — ROCURONIUM BROMIDE 50 MG/5ML IV SOSY
PREFILLED_SYRINGE | INTRAVENOUS | Status: DC | PRN
  Administered 2018-11-20: 50 mg via INTRAVENOUS

## 2018-11-20 MED ORDER — 0.9 % SODIUM CHLORIDE (POUR BTL) OPTIME
TOPICAL | Status: DC | PRN
  Administered 2018-11-20: 1000 mL

## 2018-11-20 MED ORDER — SUGAMMADEX SODIUM 200 MG/2ML IV SOLN
INTRAVENOUS | Status: DC | PRN
  Administered 2018-11-20: 200 mg via INTRAVENOUS

## 2018-11-20 MED ORDER — ACETAMINOPHEN 500 MG PO TABS
1000.0000 mg | ORAL_TABLET | Freq: Once | ORAL | Status: AC
  Administered 2018-11-20: 06:00:00 1000 mg via ORAL
  Filled 2018-11-20: qty 2

## 2018-11-20 MED ORDER — PROPOFOL 10 MG/ML IV BOLUS
INTRAVENOUS | Status: AC
  Filled 2018-11-20: qty 20

## 2018-11-20 MED ORDER — PHENYLEPHRINE HCL (PRESSORS) 10 MG/ML IV SOLN
INTRAVENOUS | Status: DC | PRN
  Administered 2018-11-20: 80 ug via INTRAVENOUS

## 2018-11-20 MED ORDER — SODIUM CHLORIDE 0.9 % IV SOLN
INTRAVENOUS | Status: DC | PRN
  Administered 2018-11-20: 25 ug/min via INTRAVENOUS

## 2018-11-20 MED ORDER — FENTANYL CITRATE (PF) 100 MCG/2ML IJ SOLN: 25.0000 ug | INTRAMUSCULAR | Status: DC | PRN

## 2018-11-20 MED ORDER — SCOPOLAMINE 1 MG/3DAYS TD PT72
MEDICATED_PATCH | TRANSDERMAL | Status: AC
  Filled 2018-11-20: qty 1

## 2018-11-20 MED ORDER — DEXAMETHASONE SODIUM PHOSPHATE 10 MG/ML IJ SOLN
INTRAMUSCULAR | Status: AC
  Filled 2018-11-20: qty 2

## 2018-11-20 MED ORDER — ROCURONIUM BROMIDE 10 MG/ML (PF) SYRINGE
PREFILLED_SYRINGE | INTRAVENOUS | Status: AC
  Filled 2018-11-20: qty 10

## 2018-11-20 MED ORDER — DEXAMETHASONE SODIUM PHOSPHATE 10 MG/ML IJ SOLN
INTRAMUSCULAR | Status: DC | PRN
  Administered 2018-11-20: 10 mg via INTRAVENOUS

## 2018-11-20 MED ORDER — FLUTICASONE-SALMETEROL 250-50 MCG/DOSE IN AEPB: 1.0000 | INHALATION_SPRAY | Freq: Two times a day (BID) | RESPIRATORY_TRACT | 1 refills | Status: DC

## 2018-11-20 MED ORDER — ONDANSETRON HCL 4 MG/2ML IJ SOLN
INTRAMUSCULAR | Status: AC
  Filled 2018-11-20: qty 4

## 2018-11-20 MED ORDER — FENTANYL CITRATE (PF) 100 MCG/2ML IJ SOLN
INTRAMUSCULAR | Status: DC | PRN
  Administered 2018-11-20: 100 ug via INTRAVENOUS

## 2018-11-20 MED ORDER — PHENYLEPHRINE 40 MCG/ML (10ML) SYRINGE FOR IV PUSH (FOR BLOOD PRESSURE SUPPORT)
PREFILLED_SYRINGE | INTRAVENOUS | Status: AC
  Filled 2018-11-20: qty 10

## 2018-11-20 MED ORDER — ONDANSETRON HCL 4 MG/2ML IJ SOLN
INTRAMUSCULAR | Status: DC | PRN
  Administered 2018-11-20: 4 mg via INTRAVENOUS

## 2018-11-20 MED ORDER — LIDOCAINE 2% (20 MG/ML) 5 ML SYRINGE
INTRAMUSCULAR | Status: DC | PRN
  Administered 2018-11-20: 50 mg via INTRAVENOUS

## 2018-11-20 MED ORDER — LACTATED RINGERS IV SOLN
INTRAVENOUS | Status: DC | PRN
  Administered 2018-11-20 (×2): via INTRAVENOUS

## 2018-11-20 MED ORDER — MIDAZOLAM HCL 2 MG/2ML IJ SOLN
INTRAMUSCULAR | Status: AC
  Filled 2018-11-20: qty 2

## 2018-11-20 SURGICAL SUPPLY — 36 items
ADAPTER VALVE BIOPSY EBUS (MISCELLANEOUS) IMPLANT
ADPTR VALVE BIOPSY EBUS (MISCELLANEOUS)
BALLN FOR EBUS SCOPE (BALLOONS) ×2
BALLOON FOR EBUS SCOPE (BALLOONS) IMPLANT
BRUSH CYTOL CELLEBRITY 1.5X140 (MISCELLANEOUS) ×1 IMPLANT
CANISTER SUCT 3000ML PPV (MISCELLANEOUS) ×2 IMPLANT
CONT SPEC 4OZ CLIKSEAL STRL BL (MISCELLANEOUS) ×4 IMPLANT
COVER BACK TABLE 60X90IN (DRAPES) ×2 IMPLANT
Custom Procedure Kit Custom Olympus Kit IMPLANT
FILTER STRAW FLUID ASPIR (MISCELLANEOUS) IMPLANT
FORCEPS BIOP RJ4 1.8 (CUTTING FORCEPS) ×1 IMPLANT
GAUZE SPONGE 4X4 12PLY STRL (GAUZE/BANDAGES/DRESSINGS) ×2 IMPLANT
GLOVE SURG SS PI 7.5 STRL IVOR (GLOVE) ×4 IMPLANT
GOWN STRL REUS W/ TWL LRG LVL3 (GOWN DISPOSABLE) ×2 IMPLANT
GOWN STRL REUS W/TWL LRG LVL3 (GOWN DISPOSABLE) ×2
KIT CLEAN ENDO COMPLIANCE (KITS) ×4 IMPLANT
KIT TURNOVER KIT B (KITS) ×2 IMPLANT
MARKER SKIN DUAL TIP RULER LAB (MISCELLANEOUS) ×2 IMPLANT
NDL ASPIRATION VIZISHOT 19G (NEEDLE) IMPLANT
NEEDLE ASPIRATION VIZISHOT 19G (NEEDLE) ×2 IMPLANT
NS IRRIG 1000ML POUR BTL (IV SOLUTION) ×2 IMPLANT
OIL SILICONE PENTAX (PARTS (SERVICE/REPAIRS)) ×2 IMPLANT
PAD ARMBOARD 7.5X6 YLW CONV (MISCELLANEOUS) ×4 IMPLANT
STOPCOCK 4 WAY LG BORE MALE ST (IV SETS) ×2 IMPLANT
SYR 20ML ECCENTRIC (SYRINGE) ×6 IMPLANT
SYR 20ML LL LF (SYRINGE) ×2 IMPLANT
SYR 3ML LL SCALE MARK (SYRINGE) IMPLANT
SYR 50ML SLIP (SYRINGE) ×3 IMPLANT
SYR 5ML LL (SYRINGE) ×8 IMPLANT
TRAP SPECIMEN MUCOUS 40CC (MISCELLANEOUS) IMPLANT
TUBE CONNECTING 20X1/4 (TUBING) ×2 IMPLANT
TUBING EXTENTION W/L.L. (IV SETS) ×2 IMPLANT
VALVE BIOPSY  SINGLE USE (MISCELLANEOUS) ×2
VALVE BIOPSY SINGLE USE (MISCELLANEOUS) ×1 IMPLANT
VALVE SUCTION BRONCHIO DISP (MISCELLANEOUS) ×3 IMPLANT
WATER STERILE IRR 1000ML POUR (IV SOLUTION) ×2 IMPLANT

## 2018-11-20 NOTE — Discharge Instructions (Signed)

## 2018-11-20 NOTE — Telephone Encounter (Signed)
-----   Message from Garner Nash, DO sent at 11/20/2018  9:42 AM EDT ----- Regarding: appt [place]  Please schedule her for a televisit with me on next available. Or if she wants and office visit to discuss plans following bronchoscopy I think I am back in clinic starting aug 10 Thanks Leory Plowman

## 2018-11-20 NOTE — Interval H&P Note (Signed)
History and Physical Interval Note:  11/20/2018 6:46 AM  Tamara Myers  has presented today for surgery, with the diagnosis of RIGHT LUNG MASS.  The various methods of treatment have been discussed with the patient and family. After consideration of risks, benefits and other options for treatment, the patient has consented to  Procedure(s): [place] (Right) as a surgical intervention.  The patient's history has been reviewed, patient examined, no change in status, stable for surgery.  I have reviewed the patient's chart and labs.  Questions were answered to the patient's satisfaction.    Patient was seen in pre-op. All questions answered. Discussed risk of pneumothorax and bleeding regarding bronchoscopy. Patient freely signed consent. No barriers to proceed.   [place]

## 2018-11-20 NOTE — Transfer of Care (Signed)
Immediate Anesthesia Transfer of Care Note  Patient: Tamara Myers  Procedure(s) Performed: VIDEO BRONCHOSCOPY WITH ENDOBRONCHIAL ULTRASOUND (N/A Bronchus) Video Bronchoscopy With Fluoro (N/A Bronchus)  Patient Location: PACU  Anesthesia Type:General  Level of Consciousness: awake, alert  and oriented  Airway & Oxygen Therapy: Patient Spontanous Breathing and Patient connected to nasal cannula oxygen  Post-op Assessment: Report given to RN, Post -op Vital signs reviewed and stable and Patient moving all extremities X 4  Post vital signs: Reviewed and stable  Last Vitals:  Vitals Value Taken Time  BP 106/62 11/20/18 0914  Temp    Pulse 82 11/20/18 0914  Resp 16 11/20/18 0914  SpO2 99 % 11/20/18 0914  Vitals shown include unvalidated device data.  Last Pain:  Vitals:   11/20/18 0625  TempSrc:   PainSc: 0-No pain      Patients Stated Pain Goal: 5 (76/72/09 4709)  Complications: No apparent anesthesia complications

## 2018-11-20 NOTE — Anesthesia Procedure Notes (Signed)
Procedure Name: Intubation Date/Time: 11/20/2018 7:33 AM Performed by: Neldon Newport, CRNA Pre-anesthesia Checklist: Timeout performed, Patient being monitored, Suction available, Emergency Drugs available and Patient identified Patient Re-evaluated:Patient Re-evaluated prior to induction Oxygen Delivery Method: Circle system utilized Preoxygenation: Pre-oxygenation with 100% oxygen Induction Type: IV induction Ventilation: Mask ventilation without difficulty Laryngoscope Size: Mac and 3 Grade View: Grade I Tube type: Oral Tube size: 8.5 mm Number of attempts: 1 Placement Confirmation: breath sounds checked- equal and bilateral,  positive ETCO2 and ETT inserted through vocal cords under direct vision Secured at: 21 cm Tube secured with: Tape Dental Injury: Teeth and Oropharynx as per pre-operative assessment

## 2018-11-20 NOTE — Telephone Encounter (Signed)
Spoke with the pt's spouse  He is requesting 90 day supply Wixela  Rx was sent  Nothing further needed

## 2018-11-20 NOTE — Op Note (Addendum)
Video Bronchoscopy with Endobronchial Ultrasound and Transbronchial forcep biopsy procedure Note  Date of Operation: 11/20/2018  Pre-op Diagnosis: Mediastinal adenopathy, abnormal CT, possible sarcoidosis  Post-op Diagnosis: Mediastinal adenopathy, abnormal CT, possible sarcoidosis  Surgeon: Garner Nash, DO   Assistants: None   Anesthesia: General endotracheal anesthesia  Operation: Flexible video fiberoptic bronchoscopy with endobronchial ultrasound and biopsies.  Estimated Blood Loss: Minimal, less than 3 cc  Complications: None  Indications and History: Tamara Myers is a 63 y.o. female with mediastinal adenopathy, abnormal CT, possible sarcoidosis.  The risks, benefits, complications, treatment options and expected outcomes were discussed with the patient.  The possibilities of pneumothorax, pneumonia, reaction to medication, pulmonary aspiration, perforation of a viscus, bleeding, failure to diagnose a condition and creating a complication requiring transfusion or operation were discussed with the patient who freely signed the consent.    Description of Procedure: The patient was examined in the preoperative area and history and data from the preprocedure consultation were reviewed. It was deemed appropriate to proceed.  The patient was taken to [facility] OR 10, identified as Tamara Myers and the procedure verified as Flexible Video Fiberoptic Bronchoscopy.  A Time Out was held and the above information confirmed. After being taken to the operating room general anesthesia was initiated and the patient  was orally intubated. The video fiberoptic bronchoscope was introduced via the endotracheal tube and a general inspection was performed which showed bronchiectatic openings to the bilateral upper or lower lobe and RML openings.  No evidence of endobronchial lesion or cobblestoning. The standard scope was then withdrawn and the endobronchial ultrasound was used to identify and characterize  the peritracheal, hilar and bronchial lymph nodes. Inspection showed enlarged 1.5 x 2 cm cross-sectional view of the lymph node and station 4R, no enlarged nodes within the right hilum visible, normal station 7.  There was a visible lymph node within station 11 R that was approximately 1 cm.  However there was no available approach to passing the needle without transecting an adjacent vessel.  The distal tip of the Olympus endobronchial ultrasound scope was too large to pass any further distally to obtain an adequate view.  Therefore 11 R was not attempted at biopsy. Using real-time ultrasound guidance transbronchial needle aspiration biopsies were take from [place] and were sent for cytology.   Following endobronchial ultrasound-guided transbronchial needle aspirations of the mediastinum, the scope was exchanged for standard flexible forward-viewing Olympus bronchoscope.  The right middle lobe was our target as defined by CT imaging with presence of disease.  A large volume BAL of 240 cc of saline was instilled with greater than 75% return.  This will be sent for cultures, cell count differential, CD4 CD8 ratio.  Following completion of the large-volume BAL we used a cytology brush to obtain brushing specimens under real-time fluoroscopy to the right middle lobe.  There was 3 brush specimens and slide sets made.  We then used forceps to obtain fluoroscopic guided transbronchial biopsies within the medial and lateral subsegments of the right middle lobe.  During the transbronchial biopsies the following sequence occurred.  And expiratory breath-hold maneuver was performed, the forceps were extended into the airway to the desired location under real-time fluoroscopy, a sample was obtained and the forceps were removed and the breath-hold was released.  8 transbronchial biopsy specimens were obtained in this fashion.  Following completion of the procedures within the right middle lobe the bronchoscope was  used to aspirate and clear out the  remaining secretions and blood clots from within the bilateral mainstem's.  The bronchoscope was returned to just above the main carina and there was no evidence of active bleeding from either side.  The patient tolerated the procedure well without apparent complications. There was no significant blood loss. The bronchoscope was withdrawn. Anesthesia was reversed and the patient was taken to the PACU for recovery.   Samples: 1.  Transbronchial needle aspiration biopsies from station 4R 2.  BAL No. 1 and BAL No. 2 from right middle lobe 3.  Cytology brushings from the right middle lobe 4.  Transbronchial forcep biopsies from the right middle lobe  Plans:  The patient will be discharged from the PACU to home when recovered from anesthesia. We will review the cytology, pathology and microbiology results with the patient when they become available. Outpatient followup will be with Garner Nash, DO.   Garner Nash, DO [facility] 11/20/2018 9:17 AM  Personal pager: [phone] If unanswered, please page CCM On-call: [phone]

## 2018-11-20 NOTE — Telephone Encounter (Signed)
Appt has been made for 12/09/2018. Nothing further is needed at this time.

## 2018-11-21 LAB — ACID FAST SMEAR (AFB, MYCOBACTERIA)
Acid Fast Smear: NEGATIVE
Acid Fast Smear: NEGATIVE

## 2018-11-22 LAB — CULTURE, RESPIRATORY W GRAM STAIN
Culture: NO GROWTH
Culture: NO GROWTH

## 2018-11-22 NOTE — Anesthesia Postprocedure Evaluation (Signed)
Anesthesia Post Note  Patient: Tamara Myers  Procedure(s) Performed: VIDEO BRONCHOSCOPY WITH ENDOBRONCHIAL ULTRASOUND (N/A Bronchus) Video Bronchoscopy With Fluoro (N/A Bronchus)     Patient location during evaluation: PACU Anesthesia Type: General Level of consciousness: awake and alert Pain management: pain level controlled Vital Signs Assessment: post-procedure vital signs reviewed and stable Respiratory status: spontaneous breathing, nonlabored ventilation, respiratory function stable and patient connected to nasal cannula oxygen Cardiovascular status: blood pressure returned to baseline and stable Postop Assessment: no apparent nausea or vomiting Anesthetic complications: no    Last Vitals:  Vitals:   11/20/18 0945 11/20/18 1000  BP: 104/63 106/60  Pulse: 82 79  Resp: 19   Temp:  36.8 C  SpO2: 94% 95%    Last Pain:  Vitals:   11/20/18 1000  TempSrc:   PainSc: 0-No pain   Pain Goal: Patients Stated Pain Goal: 5 (11/20/18 0625)                 Chen Saadeh L Jerrine Urschel

## 2018-11-23 ENCOUNTER — Encounter (HOSPITAL_COMMUNITY): Payer: Self-pay | Admitting: Pulmonary Disease

## 2018-11-26 ENCOUNTER — Telehealth: Payer: Self-pay

## 2018-11-26 DIAGNOSIS — D869 Sarcoidosis, unspecified: Secondary | ICD-10-CM

## 2018-11-26 NOTE — Telephone Encounter (Signed)
Referral order placed. Nothing further is needed at this time.

## 2018-11-26 NOTE — Telephone Encounter (Signed)
-----   Message from Garner Nash, DO sent at 11/26/2018  4:16 PM EDT ----- [place],  I called and spoke with the patient regarding her results.  Appt planned on 8/10 for follow up.  She needs a referral to ophthalmology due to new diagnosis of sarcoidosis Thanks Leory Plowman

## 2018-11-29 ENCOUNTER — Telehealth: Payer: Self-pay

## 2018-11-29 NOTE — Telephone Encounter (Signed)
Received paperwork stating a PA was needed for Wixela.   PA initiated via Cover my meds Key: [id number]  Will await a determination. Will route message to myself to hold to f/u.

## 2018-12-03 NOTE — Telephone Encounter (Signed)
ATC Patrina unable to reach. LM to call back.

## 2018-12-03 NOTE — Telephone Encounter (Signed)
Spoke with Tamara Myers, gave her information she needed and she will continue the PA process.

## 2018-12-06 NOTE — Telephone Encounter (Signed)
PCCM: Is there a different ICS/LABA option on her formulary? [facility] 12/06/2018 4:16 PM

## 2018-12-06 NOTE — Telephone Encounter (Signed)
PCCM:  I am seeing her tomorrow in the office. I will talk with the patient first.   Thanks  Garner Nash, DO [facility] 12/06/2018 4:39 PM

## 2018-12-06 NOTE — Telephone Encounter (Signed)
Will route to [place] for follow up.

## 2018-12-06 NOTE — Telephone Encounter (Signed)
Received a denial letter from [employer]. Medication was denied because 2 medications in the same formulary have not been tried and failed. [employer] will only pay for the Advair Diskus at this time.   Dr. Valeta Harms, please advise if you wish to proceed with an appeal or if you want to switch to Advair? Thanks!

## 2018-12-06 NOTE — Telephone Encounter (Signed)
The only medication listed on the forms provided by the insurance company was Advair.   When I checked the formulary app on my phone, Stan Head and Symbicort are showing as covered medications without a PA.

## 2018-12-09 ENCOUNTER — Ambulatory Visit (INDEPENDENT_AMBULATORY_CARE_PROVIDER_SITE_OTHER): Payer: BC Managed Care – PPO | Admitting: Pulmonary Disease

## 2018-12-09 ENCOUNTER — Encounter: Payer: Self-pay | Admitting: Pulmonary Disease

## 2018-12-09 ENCOUNTER — Other Ambulatory Visit: Payer: Self-pay

## 2018-12-09 DIAGNOSIS — H2513 Age-related nuclear cataract, bilateral: Secondary | ICD-10-CM | POA: Diagnosis not present

## 2018-12-09 DIAGNOSIS — R05 Cough: Secondary | ICD-10-CM | POA: Diagnosis not present

## 2018-12-09 DIAGNOSIS — D869 Sarcoidosis, unspecified: Secondary | ICD-10-CM

## 2018-12-09 DIAGNOSIS — R059 Cough, unspecified: Secondary | ICD-10-CM

## 2018-12-09 DIAGNOSIS — H25013 Cortical age-related cataract, bilateral: Secondary | ICD-10-CM | POA: Diagnosis not present

## 2018-12-09 DIAGNOSIS — R0602 Shortness of breath: Secondary | ICD-10-CM | POA: Diagnosis not present

## 2018-12-09 DIAGNOSIS — H04123 Dry eye syndrome of bilateral lacrimal glands: Secondary | ICD-10-CM | POA: Diagnosis not present

## 2018-12-09 MED ORDER — SULFAMETHOXAZOLE-TRIMETHOPRIM 800-160 MG PO TABS: 1.0000 | ORAL_TABLET | ORAL | 0 refills | Status: AC

## 2018-12-09 MED ORDER — PREDNISONE 10 MG PO TABS: ORAL_TABLET | ORAL | 0 refills | Status: DC

## 2018-12-09 NOTE — Progress Notes (Signed)
Virtual Visit via Telephone Note  I connected with Tamara Myers on 12/09/18 at  3:30 PM EDT by telephone and verified that I am speaking with the correct person using two identifiers.  Location: Patient: [place] Provider: Garner Nash, DO    I discussed the limitations, risks, security and privacy concerns of performing an evaluation and management service by telephone and the availability of in person appointments. I also discussed with the patient that there may be a patient responsible charge related to this service. The patient expressed understanding and agreed to proceed.   History of Present Illness:  Patient has had complaints of progressive cough for the past year.  She was recently placed on a PPI for treatment of reflux.  She was ultimately referred to allergy.  She saw Dr. Maudie Mercury which evaluated for her cough.  She had a chest x-ray which revealed interstitial markings.  She ultimately had a noncontrast CT of the chest which revealed perilymphatic micro-nodularity consistent with pulmonary sarcoidosis with small areas of bilateral hilar adenopathy and a right-sided 1.4 cm hilar lymph node.  She had a dominant right-sided right lower lobe nodule that measured 1 cm in size also likely secondary to sarcoid however recommending a 3 to 48-month follow-up.  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  No family history of sarcoidosis.  Father died of pulmonary fibrosis.  Patient has not had any eye or skin symptoms.  Patient underwent bronchoscopy on 11/20/2018.  Transbronchial lung biopsies revealed granulomatous inflammation.  Patient still has persistent dry cough and occasional shortness of breath.   Observations/Objective: Audible dry cough during conversation on the phone.  Assessment and Plan:    ICD-10-CM   1. Sarcoidosis  D86.9   2. Cough  R05   3.  Shortness of breath  R06.02    Will start 3-month taper of prednisone therapy, 60 mg x 3 weeks, 40 mg x 3 weeks, 20 mg x 3 weeks, 10 mg x 3 weeks. Start Bactrim double strength tablet 1 3 times a week Monday Wednesday Friday Noncontrasted CT of the chest in 3 months.  We discussed risk benefits and alternatives of proceeding with prednisone therapy.  Follow Up Instructions:  Follow-up in 3 months   I discussed the assessment and treatment plan with the patient. The patient was provided an opportunity to ask questions and all were answered. The patient agreed with the plan and demonstrated an understanding of the instructions.   The patient was advised to call back or seek an in-person evaluation if the symptoms worsen or if the condition fails to improve as anticipated.  I provided 26 minutes of non-face-to-face time during this encounter.   Garner Nash, DO

## 2018-12-09 NOTE — Patient Instructions (Addendum)
Thank you for visiting Dr. Valeta Harms at [facility]. Today we recommend the following: Orders Placed This Encounter  Procedures  . CT Chest Wo Contrast   Meds ordered this encounter  Medications  . predniSONE (DELTASONE) 10 MG tablet    Sig: Take 60mg  for 3 weeks, 40mg  for 3 weeks, 20mg  for 3 weeks, 10mg  for 3 weeks, then STOP.    Dispense:  273 tablet    Refill:  0  . sulfamethoxazole-trimethoprim (BACTRIM DS) 800-160 MG tablet    Sig: Take 1 tablet by mouth 3 (three) times a week.    Dispense:  36 tablet    Refill:  0   Return in about 3 months (around 03/11/2019).    Please do your part to reduce the spread of COVID-19.

## 2018-12-10 ENCOUNTER — Encounter: Payer: Self-pay | Admitting: Internal Medicine

## 2018-12-10 NOTE — Telephone Encounter (Signed)
Addressed during visit. Nothing further needed at this time.

## 2018-12-18 ENCOUNTER — Other Ambulatory Visit: Payer: Self-pay

## 2018-12-18 MED ORDER — FLUTICASONE-SALMETEROL 100-50 MCG/DOSE IN AEPB: 1.0000 | INHALATION_SPRAY | Freq: Two times a day (BID) | RESPIRATORY_TRACT | 3 refills | Status: DC

## 2018-12-20 LAB — FUNGUS CULTURE WITH STAIN

## 2018-12-20 LAB — FUNGUS CULTURE RESULT

## 2018-12-20 LAB — FUNGAL ORGANISM REFLEX

## 2018-12-27 ENCOUNTER — Other Ambulatory Visit: Payer: BC Managed Care – PPO | Admitting: Internal Medicine

## 2018-12-27 ENCOUNTER — Other Ambulatory Visit: Payer: Self-pay

## 2018-12-27 DIAGNOSIS — E7800 Pure hypercholesterolemia, unspecified: Secondary | ICD-10-CM | POA: Diagnosis not present

## 2018-12-27 DIAGNOSIS — E559 Vitamin D deficiency, unspecified: Secondary | ICD-10-CM | POA: Diagnosis not present

## 2018-12-27 DIAGNOSIS — I10 Essential (primary) hypertension: Secondary | ICD-10-CM | POA: Diagnosis not present

## 2018-12-27 DIAGNOSIS — Z0000 Encounter for general adult medical examination without abnormal findings: Secondary | ICD-10-CM

## 2018-12-27 DIAGNOSIS — E785 Hyperlipidemia, unspecified: Secondary | ICD-10-CM | POA: Diagnosis not present

## 2018-12-27 DIAGNOSIS — K219 Gastro-esophageal reflux disease without esophagitis: Secondary | ICD-10-CM

## 2018-12-28 LAB — CBC WITH DIFFERENTIAL/PLATELET
Absolute Monocytes: 456 cells/uL (ref 200–950)
Basophils Absolute: 12 cells/uL (ref 0–200)
Basophils Relative: 0.3 %
Eosinophils Absolute: 12 cells/uL — ABNORMAL LOW (ref 15–500)
Eosinophils Relative: 0.3 %
HCT: 44.8 % (ref 35.0–45.0)
Hemoglobin: 15.2 g/dL (ref 11.7–15.5)
Lymphs Abs: 588 cells/uL — ABNORMAL LOW (ref 850–3900)
MCH: 30 pg (ref 27.0–33.0)
MCHC: 33.9 g/dL (ref 32.0–36.0)
MCV: 88.4 fL (ref 80.0–100.0)
MPV: 10.4 fL (ref 7.5–12.5)
Monocytes Relative: 11.4 %
Neutro Abs: 2932 cells/uL (ref 1500–7800)
Neutrophils Relative %: 73.3 %
Platelets: 232 10*3/uL (ref 140–400)
RBC: 5.07 10*6/uL (ref 3.80–5.10)
RDW: 12.2 % (ref 11.0–15.0)
Total Lymphocyte: 14.7 %
WBC: 4 10*3/uL (ref 3.8–10.8)

## 2018-12-28 LAB — LIPID PANEL
Cholesterol: 208 mg/dL — ABNORMAL HIGH (ref <200)
HDL: 109 mg/dL (ref >=50)
LDL Cholesterol (Calc): 80 mg/dL (calc)
Non-HDL Cholesterol (Calc): 99 mg/dL (calc) (ref <130)
Total CHOL/HDL Ratio: 1.9 (calc) (ref <5.0)
Triglycerides: 102 mg/dL (ref <150)

## 2018-12-28 LAB — TSH: TSH: 1.05 mIU/L (ref 0.40–4.50)

## 2018-12-28 LAB — COMPLETE METABOLIC PANEL WITH GFR
AG Ratio: 2.6 (calc) — ABNORMAL HIGH (ref 1.0–2.5)
ALT: 15 U/L (ref 6–29)
AST: 14 U/L (ref 10–35)
Albumin: 4.4 g/dL (ref 3.6–5.1)
Alkaline phosphatase (APISO): 40 U/L (ref 37–153)
BUN: 16 mg/dL (ref 7–25)
CO2: 30 mmol/L (ref 20–32)
Calcium: 9.3 mg/dL (ref 8.6–10.4)
Chloride: 100 mmol/L (ref 98–110)
Creat: 0.9 mg/dL (ref 0.50–0.99)
GFR, Est African American: 79 mL/min/{1.73_m2} (ref >=60)
GFR, Est Non African American: 69 mL/min/{1.73_m2} (ref >=60)
Globulin: 1.7 g/dL (calc) — ABNORMAL LOW (ref 1.9–3.7)
Glucose, Bld: 91 mg/dL (ref 65–99)
Potassium: 3.7 mmol/L (ref 3.5–5.3)
Sodium: 138 mmol/L (ref 135–146)
Total Bilirubin: 1.1 mg/dL (ref 0.2–1.2)
Total Protein: 6.1 g/dL (ref 6.1–8.1)

## 2018-12-28 LAB — VITAMIN D 25 HYDROXY (VIT D DEFICIENCY, FRACTURES): Vit D, 25-Hydroxy: 26 ng/mL — ABNORMAL LOW (ref 30–100)

## 2019-01-02 ENCOUNTER — Encounter: Payer: Self-pay | Admitting: Internal Medicine

## 2019-01-02 ENCOUNTER — Other Ambulatory Visit: Payer: Self-pay

## 2019-01-02 ENCOUNTER — Ambulatory Visit (INDEPENDENT_AMBULATORY_CARE_PROVIDER_SITE_OTHER): Payer: BC Managed Care – PPO | Admitting: Internal Medicine

## 2019-01-02 VITALS — BP 120/80 | HR 68 | Temp 98.3°F | Ht 63.0 in | Wt 123.0 lb

## 2019-01-02 DIAGNOSIS — D860 Sarcoidosis of lung: Secondary | ICD-10-CM

## 2019-01-02 DIAGNOSIS — D708 Other neutropenia: Secondary | ICD-10-CM | POA: Diagnosis not present

## 2019-01-02 DIAGNOSIS — E7800 Pure hypercholesterolemia, unspecified: Secondary | ICD-10-CM | POA: Diagnosis not present

## 2019-01-02 DIAGNOSIS — K219 Gastro-esophageal reflux disease without esophagitis: Secondary | ICD-10-CM

## 2019-01-02 DIAGNOSIS — Z0000 Encounter for general adult medical examination without abnormal findings: Secondary | ICD-10-CM | POA: Diagnosis not present

## 2019-01-02 DIAGNOSIS — Z23 Encounter for immunization: Secondary | ICD-10-CM | POA: Diagnosis not present

## 2019-01-02 DIAGNOSIS — I10 Essential (primary) hypertension: Secondary | ICD-10-CM | POA: Diagnosis not present

## 2019-01-02 LAB — POCT URINALYSIS DIPSTICK
Appearance: NEGATIVE
Bilirubin, UA: NEGATIVE
Blood, UA: NEGATIVE
Glucose, UA: NEGATIVE
Ketones, UA: NEGATIVE
Leukocytes, UA: NEGATIVE
Nitrite, UA: NEGATIVE
Odor: NEGATIVE
Protein, UA: NEGATIVE
Spec Grav, UA: 1.015 (ref 1.010–1.025)
Urobilinogen, UA: 0.2 E.U./dL
pH, UA: 6.5 (ref 5.0–8.0)

## 2019-01-02 MED ORDER — ERGOCALCIFEROL 1.25 MG (50000 UT) PO CAPS: 50000.0000 [IU] | ORAL_CAPSULE | ORAL | 0 refills | Status: DC

## 2019-01-02 NOTE — Progress Notes (Signed)
   Subjective:    Patient ID: Tamara Myers, female    DOB: 11/06/1955, 63 y.o.   MRN: [id number]  HPI 63 year old female in today for health maintenance exam and evaluation of medical issues.  She has a history of hyperlipidemia and vitamin D deficiency.  Recently has been diagnosed with sarcoidosis and is seeing a pulmonologist.  History of essential hypertension and fibrocystic breast disease  No known drug allergies.  Open reduction internal fixation right thumb July 2016 by Dr. Apolonio Schneiders.  This was due to right thumb metacarpal shaft fracture.  No known drug allergies.  History of dysplastic nevus right leg treated by Dr. Sarajane Jews, dermatologist and Mohs surgeon.  Atypical nevus removed from arm and a basal cell carcinoma removed in the past as well.  Became menopausal in 2000.  She had 2 C-sections in the past.  Fractured right fifth metacarpal.  Fracture left fifth finger at age 58.  History of right knee pain and has seen orthopedist in 2011 at which time she received an injection of Xylocaine and Aristospan.  History of aspiration right wrist May 2012.  Social history: She works for the [employer].  She is married.  2 adult children.  Mother is also a patient here and has macular degeneration.  Patient does not smoke or consume alcohol.  Family history: Father with history of CABG, diabetes, mood syndrome hyperlipidemia hypertension died from respiratory failure.  He had pulmonary fibrosis.  Mother with history of Mnire's disease macular degeneration and remote history of melanoma.    Review of Systems cough which precipitated evaluation that led to diagnosis of sarcoidosis.  Chest CT showed small areas of bilateral hilar adenopathy and a right-sided 1.4 cm hilar lymph node.  Dominant right-sided right lower lobe nodule measuring 1.2 cm likely secondary to sarcoid but recommending 3 to 34-month follow-up.  Currently being managed with prednisone and tapering course over several  weeks.  Will have noncontrast CT of the chest in November.  Followed by Dr. Valeta Harms     Objective:   Physical Exam Blood pressure 120/80, pulse 68, temperature 98%, temperature 98.3 degrees orally weight 123 pounds, height 5 feet 3 inches, BMI 21.79.  Skin warm and dry.  No cervical adenopathy.  TMs and pharynx are clear.  Neck is supple without JVD thyromegaly or carotid bruits.  Chest is clear to auscultation.  Cardiac exam regular rate and rhythm normal S1 and S2.  Breast without masses.  Abdomen soft nondistended without hepatosplenomegaly masses or tenderness.  Pap taken in 2019.  Bimanual normal.  Extremities without deformity.  Neuro no focal deficits.  Thought process, affect, and judgment within normal limits.       Assessment & Plan:  Sarcoidosis being treated by pulmonologist with tapering course of prednisone with follow-up in November with CT scan  Essential hypertension-stable on current regimen  GE reflux treated with PPI  Hyperlipidemia treated with Crestor 5 mg daily  History of leukopenia monitored over the past few years.  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  White blood cell count is now normal at 4000 but patient is on steroid therapy.  Plan:

## 2019-01-03 LAB — ACID FAST CULTURE WITH REFLEXED SENSITIVITIES (MYCOBACTERIA)
Acid Fast Culture: NEGATIVE
Acid Fast Culture: NEGATIVE

## 2019-01-25 NOTE — Patient Instructions (Signed)
It was a pleasure to see you today.  Continue prednisone taper per pulmonology with follow-up CT in November.  Return here in March 2021.  Continue current medications.

## 2019-02-05 ENCOUNTER — Other Ambulatory Visit: Payer: Self-pay | Admitting: Allergy and Immunology

## 2019-02-12 DIAGNOSIS — Z85828 Personal history of other malignant neoplasm of skin: Secondary | ICD-10-CM | POA: Diagnosis not present

## 2019-02-12 DIAGNOSIS — C44619 Basal cell carcinoma of skin of left upper limb, including shoulder: Secondary | ICD-10-CM | POA: Diagnosis not present

## 2019-02-12 DIAGNOSIS — D2262 Melanocytic nevi of left upper limb, including shoulder: Secondary | ICD-10-CM | POA: Diagnosis not present

## 2019-02-12 DIAGNOSIS — C44719 Basal cell carcinoma of skin of left lower limb, including hip: Secondary | ICD-10-CM | POA: Diagnosis not present

## 2019-02-12 DIAGNOSIS — L570 Actinic keratosis: Secondary | ICD-10-CM | POA: Diagnosis not present

## 2019-02-12 DIAGNOSIS — D1801 Hemangioma of skin and subcutaneous tissue: Secondary | ICD-10-CM | POA: Diagnosis not present

## 2019-02-12 DIAGNOSIS — D485 Neoplasm of uncertain behavior of skin: Secondary | ICD-10-CM | POA: Diagnosis not present

## 2019-02-12 DIAGNOSIS — D225 Melanocytic nevi of trunk: Secondary | ICD-10-CM | POA: Diagnosis not present

## 2019-02-12 DIAGNOSIS — C44319 Basal cell carcinoma of skin of other parts of face: Secondary | ICD-10-CM | POA: Diagnosis not present

## 2019-03-10 ENCOUNTER — Other Ambulatory Visit: Payer: Self-pay

## 2019-03-10 ENCOUNTER — Ambulatory Visit (INDEPENDENT_AMBULATORY_CARE_PROVIDER_SITE_OTHER)
Admission: RE | Admit: 2019-03-10 | Discharge: 2019-03-10 | Disposition: A | Discharge status: Home / Self Care | Payer: BC Managed Care – PPO | Source: Ambulatory Visit | Attending: Pulmonary Disease | Admitting: Pulmonary Disease

## 2019-03-10 DIAGNOSIS — D869 Sarcoidosis, unspecified: Secondary | ICD-10-CM

## 2019-03-12 ENCOUNTER — Ambulatory Visit: Payer: BC Managed Care – PPO | Admitting: Pulmonary Disease

## 2019-03-12 ENCOUNTER — Other Ambulatory Visit: Payer: Self-pay

## 2019-03-12 ENCOUNTER — Encounter: Payer: Self-pay | Admitting: Pulmonary Disease

## 2019-03-12 VITALS — BP 128/82 | HR 82 | Temp 98.3°F | Ht 64.0 in | Wt 123.0 lb

## 2019-03-12 DIAGNOSIS — Z5181 Encounter for therapeutic drug level monitoring: Secondary | ICD-10-CM

## 2019-03-12 DIAGNOSIS — R0602 Shortness of breath: Secondary | ICD-10-CM | POA: Diagnosis not present

## 2019-03-12 DIAGNOSIS — C44319 Basal cell carcinoma of skin of other parts of face: Secondary | ICD-10-CM | POA: Diagnosis not present

## 2019-03-12 DIAGNOSIS — Z79899 Other long term (current) drug therapy: Secondary | ICD-10-CM | POA: Diagnosis not present

## 2019-03-12 DIAGNOSIS — Z79631 Long term (current) use of antimetabolite agent: Secondary | ICD-10-CM

## 2019-03-12 DIAGNOSIS — R059 Cough, unspecified: Secondary | ICD-10-CM

## 2019-03-12 DIAGNOSIS — R05 Cough: Secondary | ICD-10-CM

## 2019-03-12 DIAGNOSIS — D869 Sarcoidosis, unspecified: Secondary | ICD-10-CM

## 2019-03-12 LAB — CBC WITH DIFFERENTIAL/PLATELET
Basophils Absolute: 0 10*3/uL (ref 0.0–0.1)
Basophils Relative: 0.5 % (ref 0.0–3.0)
Eosinophils Absolute: 0 10*3/uL (ref 0.0–0.7)
Eosinophils Relative: 1.2 % (ref 0.0–5.0)
HCT: 40.3 % (ref 36.0–46.0)
Hemoglobin: 13.8 g/dL (ref 12.0–15.0)
Lymphocytes Relative: 11.8 % — ABNORMAL LOW (ref 12.0–46.0)
Lymphs Abs: 0.4 10*3/uL — ABNORMAL LOW (ref 0.7–4.0)
MCHC: 34.3 g/dL (ref 30.0–36.0)
MCV: 92.3 fl (ref 78.0–100.0)
Monocytes Absolute: 0.4 10*3/uL (ref 0.1–1.0)
Monocytes Relative: 11.9 % (ref 3.0–12.0)
Neutro Abs: 2.6 10*3/uL (ref 1.4–7.7)
Neutrophils Relative %: 74.6 % (ref 43.0–77.0)
Platelets: 222 10*3/uL (ref 150.0–400.0)
RBC: 4.36 Mil/uL (ref 3.87–5.11)
RDW: 13.3 % (ref 11.5–15.5)
WBC: 3.5 10*3/uL — ABNORMAL LOW (ref 4.0–10.5)

## 2019-03-12 MED ORDER — PREDNISONE 20 MG PO TABS: 40.0000 mg | ORAL_TABLET | Freq: Every day | ORAL | 0 refills | Status: DC

## 2019-03-12 MED ORDER — FOLIC ACID 1 MG PO TABS: 1.0000 mg | ORAL_TABLET | Freq: Every day | ORAL | 0 refills | Status: DC

## 2019-03-12 MED ORDER — SULFAMETHOXAZOLE-TRIMETHOPRIM 800-160 MG PO TABS: 1.0000 | ORAL_TABLET | ORAL | 0 refills | Status: DC

## 2019-03-12 MED ORDER — METHOTREXATE 2.5 MG PO TABS: 5.0000 mg | ORAL_TABLET | ORAL | 0 refills | Status: DC

## 2019-03-12 NOTE — Progress Notes (Signed)
Synopsis: Referred in March 2020 for abnormal CT of the chest by Elby Showers, MD  Subjective:   PATIENT ID: Tamara Myers L[place]o GENDER: female DOB: 1955-07-26, MRN: [id number]  Chief Complaint  Patient presents with   Follow-up    CT 11/9. Sarcoid. She reports her breathing has been at her baseline. Finished prednisone last Sunday. Since finishing her dry cough has come back.     Patient has had complaints of progressive cough for the past year.  She was recently placed on a PPI for treatment of reflux.  She was ultimately referred to allergy.  She saw Dr. Maudie Mercury which evaluated for her cough.  She had a chest x-ray which revealed interstitial markings.  She ultimately had a noncontrast CT of the chest which revealed perilymphatic micro-nodularity consistent with pulmonary sarcoidosis with small areas of bilateral hilar adenopathy and a right-sided 1.4 cm hilar lymph node.  She had a dominant right-sided right lower lobe nodule that measured 1 cm in size also likely secondary to sarcoid however recommending a 3 to 32-month follow-up.  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  No family history of sarcoidosis.  Father died of pulmonary fibrosis.  Patient has not had any eye or skin symptoms.  OV 03/12/2019:Patient underwent bronchoscopy November 20, 2018.  Bronchoscopy tissue biopsy revealed granulomatous inflammation.  Follow-up visit was via telephone.  In August 2020.  She was then placed on a 9-month taper of prednisone with PCP prophylaxis of Bactrim.  Patient here today for follow-up after recent diagnosis of sarcoidosis.  Overall patient doing well today.  She recently completed her 58-month taper of prednisone.  Over the past 2 weeks she has noticed increase in her cough.  Today reviewed her CT scan which was completed getting of week which revealed improvement of the nodularity  and parenchymal changes consistent with sarcoid however they are still present.  She still has dry cough.    Past Medical History:  Diagnosis Date   Cancer ([place])    basal cell carcinoma   Complication of anesthesia    slow to wake up   Dyspnea    on exertion   GERD (gastroesophageal reflux disease)    Hypertension    Melanoma ([place]) 2018   left upper arm   Moderate persistent asthma without complication [id number]   PONV (postoperative nausea and vomiting)    Post menopausal syndrome      Family History  Problem Relation Age of Onset   Diabetes Father    Heart disease Father    Hyperlipidemia Father    Hypertension Father    Pulmonary fibrosis Father    Asthma Daughter    Breast cancer Neg Hx    Allergic rhinitis Neg Hx    Eczema Neg Hx    Urticaria Neg Hx    Angioedema Neg Hx      Past Surgical History:  Procedure Laterality Date   CESAREAN SECTION     OPEN REDUCTION INTERNAL FIXATION (ORIF) METACARPAL Right 11/24/2014   Procedure: OPEN REDUCTION INTERNAL FIXATION (ORIF) RIGHT THUMB;  Surgeon: Iran Planas, MD;  Location: [facility];  Service: Orthopedics;  Laterality: Right;   thumb surgery Right 2014   VIDEO BRONCHOSCOPY N/A 11/20/2018   Procedure: Video Bronchoscopy With Fluoro;  Surgeon: Garner Nash, DO;  Location: [facility];  Service: Thoracic;  Laterality: N/A;   VIDEO BRONCHOSCOPY WITH ENDOBRONCHIAL  ULTRASOUND N/A 11/20/2018   Procedure: VIDEO BRONCHOSCOPY WITH ENDOBRONCHIAL ULTRASOUND;  Surgeon: Garner Nash, DO;  Location: [facility];  Service: Thoracic;  Laterality: N/A;    Social History   Socioeconomic History   Marital status: Married    Spouse name: Not on file   Number of children: Not on file   Years of education: Not on file   Highest education level: Not on file  Occupational History   Not on file  Social Needs   Financial resource strain: Not on file   Food insecurity    Worry: Not on file    Inability: Not on  file   Transportation needs    Medical: Not on file    Non-medical: Not on file  Tobacco Use   Smoking status: Never Smoker   Smokeless tobacco: Never Used  Substance and Sexual Activity   Alcohol use: No   Drug use: No   Sexual activity: Not on file  Lifestyle   Physical activity    Days per week: Not on file    Minutes per session: Not on file   Stress: Not on file  Relationships   Social connections    Talks on phone: Not on file    Gets together: Not on file    Attends religious service: Not on file    Active member of club or organization: Not on file    Attends meetings of clubs or organizations: Not on file    Relationship status: Not on file   Intimate partner violence    Fear of current or ex partner: Not on file    Emotionally abused: Not on file    Physically abused: Not on file    Forced sexual activity: Not on file  Other Topics Concern   Not on file  Social History Narrative   Not on file     No Known Allergies   Outpatient Medications Prior to Visit  Medication Sig Dispense Refill   albuterol (PROVENTIL HFA;VENTOLIN HFA) 108 (90 Base) MCG/ACT inhaler Inhale 2 puffs into the lungs every 6 (six) hours as needed for wheezing or shortness of breath. 18 g 1   amLODipine (NORVASC) 5 MG tablet TAKE 1 TABLET BY MOUTH EVERY DAY (Patient taking differently: Take 5 mg by mouth daily. ) 30 tablet 11   Cholecalciferol (VITAMIN D3) 10 MCG (400 UNIT) CAPS Take 400 Units by mouth 2 (two) times a day.     ergocalciferol (DRISDOL) 1.25 MG (50000 UT) capsule Take 1 capsule (50,000 Units total) by mouth once a week. 24 capsule 0   Fluticasone-Salmeterol (ADVAIR DISKUS) 100-50 MCG/DOSE AEPB Inhale 1 puff into the lungs 2 (two) times daily. 60 each 3   pantoprazole (PROTONIX) 40 MG tablet TAKE 1 TABLET BY MOUTH TWICE A DAY 60 tablet 0   rosuvastatin (CRESTOR) 5 MG tablet TAKE 1 TABLET BY MOUTH EVERY DAY (Patient taking differently: Take 5 mg by mouth daily. )  30 tablet 11   triamterene-hydrochlorothiazide (MAXZIDE-25) 37.5-25 MG tablet TAKE 1 TABLET BY MOUTH EVERY DAY (Patient taking differently: Take 1 tablet by mouth daily. ) 90 tablet 2   Fluticasone-Salmeterol (WIXELA INHUB) 250-50 MCG/DOSE AEPB Inhale 1 puff into the lungs 2 (two) times daily. 180 each 1   gabapentin (NEURONTIN) 300 MG capsule Take 300 mg by mouth at bedtime.      predniSONE (DELTASONE) 10 MG tablet Take 60mg  for 3 weeks, 40mg  for 3 weeks, 20mg  for 3 weeks, 10mg  for 3 weeks, then  STOP. (Patient not taking: Reported on 03/12/2019) 273 tablet 0   No facility-administered medications prior to visit.     Review of Systems  Constitutional: Negative for chills, fever, malaise/fatigue and weight loss.  HENT: Negative for hearing loss, sore throat and tinnitus.   Eyes: Negative for blurred vision and double vision.  Respiratory: Positive for cough. Negative for hemoptysis, sputum production, shortness of breath, wheezing and stridor.   Cardiovascular: Negative for chest pain, palpitations, orthopnea, leg swelling and PND.  Gastrointestinal: Negative for abdominal pain, constipation, diarrhea, heartburn, nausea and vomiting.  Genitourinary: Negative for dysuria, hematuria and urgency.  Musculoskeletal: Negative for joint pain and myalgias.  Skin: Negative for itching and rash.  Neurological: Negative for dizziness, tingling, weakness and headaches.  Endo/Heme/Allergies: Negative for environmental allergies. Does not bruise/bleed easily.  Psychiatric/Behavioral: Negative for depression. The patient is not nervous/anxious and does not have insomnia.   All other systems reviewed and are negative.    Objective:  Physical Exam Vitals signs reviewed.  Constitutional:      General: She is not in acute distress.    Appearance: She is well-developed.  HENT:     Head: Normocephalic and atraumatic.  Eyes:     General: No scleral icterus.    Conjunctiva/sclera: Conjunctivae  normal.     Pupils: Pupils are equal, round, and reactive to light.  Neck:     Musculoskeletal: Neck supple.     Vascular: No JVD.     Trachea: No tracheal deviation.  Cardiovascular:     Rate and Rhythm: Normal rate and regular rhythm.     Heart sounds: Normal heart sounds. No murmur.  Pulmonary:     Effort: Pulmonary effort is normal. No tachypnea, accessory muscle usage or respiratory distress.     Breath sounds: Normal breath sounds. No stridor. No wheezing, rhonchi or rales.  Abdominal:     General: Bowel sounds are normal. There is no distension.     Palpations: Abdomen is soft.     Tenderness: There is no abdominal tenderness.  Musculoskeletal:        General: No tenderness.  Lymphadenopathy:     Cervical: No cervical adenopathy.  Skin:    General: Skin is warm and dry.     Capillary Refill: Capillary refill takes less than 2 seconds.     Findings: No rash.  Neurological:     Mental Status: She is alert and oriented to person, place, and time.  Psychiatric:        Behavior: Behavior normal.      Vitals:   03/12/19 0858  BP: 128/82  Pulse: 82  Temp: 98.3 F (36.8 C)  TempSrc: Temporal  SpO2: 97%  Weight: 123 lb (55.8 kg)  Height: 5\' 4"  (1.626 m)   97% on RA BMI Readings from Last 3 Encounters:  03/12/19 21.11 kg/m  01/02/19 21.79 kg/m  11/20/18 21.97 kg/m   Wt Readings from Last 3 Encounters:  03/12/19 123 lb (55.8 kg)  01/02/19 123 lb (55.8 kg)  11/20/18 128 lb (58.1 kg)     CBC    Component Value Date/Time   WBC 4.0 12/27/2018 0930   RBC 5.07 12/27/2018 0930   HGB 15.2 12/27/2018 0930   HCT 44.8 12/27/2018 0930   PLT 232 12/27/2018 0930   MCV 88.4 12/27/2018 0930   MCV 88.1 07/29/2011 1126   MCH 30.0 12/27/2018 0930   MCHC 33.9 12/27/2018 0930   RDW 12.2 12/27/2018 0930   LYMPHSABS 588 (L) 12/27/2018 0930  MONOABS 310 12/19/2016 0923   EOSABS 12 (L) 12/27/2018 0930   BASOSABS 12 12/27/2018 0930    Chest Imaging: CT chest  07/03/2018: Evidence of peri-lymphatic bilateral micro-nodularity consistent with sarcoidosis, small mediastinal and hilar lymph nodes.  And a right lower lobe 1 cm nodule likely consistent with the sarcoid however recommending 3 to 42-month follow-up by radiology. The patient's images have been independently reviewed by me.    November 2020: CT chest with perilymphatic bilateral micronodularity which is improved in comparison to her previous imaging. The patient's images have been independently reviewed by me.    Pulmonary Functions Testing Results: PFT Results Latest Ref Rng & Units 11/06/2018 06/27/2018  FVC-Pre L 2.86 2.82  FVC-Predicted Pre % 88 86  FVC-Post L 2.92 2.82  FVC-Predicted Post % 89 86  Pre FEV1/FVC % % 74 65  Post FEV1/FCV % % 70 76  FEV1-Pre L 2.13 1.83  FEV1-Predicted Pre % 85 73  FEV1-Post L 2.04 2.13  DLCO UNC% % 100 99  DLCO COR %Predicted % 124 109  TLC L 4.74 4.57  TLC % Predicted % 93 90  RV % Predicted % 94 80     FeNO: None   Pathology:   Bronchoscopy July 2020: Lung, biopsy, right middle lobe - BENIGN LUNG PARENCHYMA WITH GRANULOMATOUS INFLAMMATION. - THERE IS NO EVIDENCE OF MALIGNANCY.  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  Predominant complaints of shortness of breath and a cough.  At this point after completing 3 months of therapy on prednisone and now tapered off her symptoms have unfortunately returned.  Today in the office we discussed the risk benefits and alternatives of proceeding with a repeat steroid treatment as well as the initiation of methotrexate.  At this time the patient has agreed to start methotrexate dosing.  We will screen her today for appropriate preinitiation labs listed below.  We also discussed all of the potential risk and common adverse effects of methotrexate  listed below.   Methotrexate Dosing for Sarcoidosis Treatment:   Starting dose of MTX for sarcoidosis is 5 mg weekly; it is increased gradually by increments of 2.5 mg every 2 weeks until a dose of 10-15 mg per week is reached. If patients develop refractory nausea/vomiting with oral MTX, it can be switched to the intramuscular route. Because the effects of MTX may not be seen until 6 months after initiation, tapering of steroids should be delayed for several months after initiating MTX.  Common adverse effects of MTX include leukopenia, nausea/vomiting and mucositis. Drug-induced pneumonitis is a rare but serious dose-dependent complication occurring months to years after treatment and has to be considered with worsening lung disease.   Prior to the initiation of therapy, it is important to obtain baseline LFT, CBC, serum creatinine, and serologies for hepatitis B and C viruses as well as testing for latent mycobacterium tuberculosis infection. Patients with underlying liver disease, advanced chronic kidney disease (GFR <30 ml/min), and chronic viral hepatitis are not candidates for MTX therapy.   Patient to return to clinic in 6 weeks. We will also have a repeat CBC BMP and hepatic function to be completed in 6 weeks prior to next office visit.  Greater than 50% of this patient's 45-minute office was spent face-to-face discussing the above recommendations and treatment plan as well as the initiation of chemotherapeutic  and risk benefits and alternatives of proceeding with immune suppression with methotrexate.    Current Outpatient Medications:    albuterol (PROVENTIL HFA;VENTOLIN HFA) 108 (90 Base) MCG/ACT inhaler, Inhale 2 puffs into the lungs every 6 (six) hours as needed for wheezing or shortness of breath., Disp: 18 g, Rfl: 1   amLODipine (NORVASC) 5 MG tablet, TAKE 1 TABLET BY MOUTH EVERY DAY (Patient taking differently: Take 5 mg by mouth daily. ), Disp: 30 tablet, Rfl: 11    Cholecalciferol (VITAMIN D3) 10 MCG (400 UNIT) CAPS, Take 400 Units by mouth 2 (two) times a day., Disp: , Rfl:    ergocalciferol (DRISDOL) 1.25 MG (50000 UT) capsule, Take 1 capsule (50,000 Units total) by mouth once a week., Disp: 24 capsule, Rfl: 0   Fluticasone-Salmeterol (ADVAIR DISKUS) 100-50 MCG/DOSE AEPB, Inhale 1 puff into the lungs 2 (two) times daily., Disp: 60 each, Rfl: 3   pantoprazole (PROTONIX) 40 MG tablet, TAKE 1 TABLET BY MOUTH TWICE A DAY, Disp: 60 tablet, Rfl: 0   rosuvastatin (CRESTOR) 5 MG tablet, TAKE 1 TABLET BY MOUTH EVERY DAY (Patient taking differently: Take 5 mg by mouth daily. ), Disp: 30 tablet, Rfl: 11   triamterene-hydrochlorothiazide (MAXZIDE-25) 37.5-25 MG tablet, TAKE 1 TABLET BY MOUTH EVERY DAY (Patient taking differently: Take 1 tablet by mouth daily. ), Disp: 90 tablet, Rfl: 2   Fluticasone-Salmeterol (WIXELA INHUB) 250-50 MCG/DOSE AEPB, Inhale 1 puff into the lungs 2 (two) times daily., Disp: 180 each, Rfl: 1   Garner Nash, DO [facility] 03/12/2019 9:12 AM

## 2019-03-12 NOTE — Patient Instructions (Addendum)
Thank you for visiting Dr. Valeta Harms at [facility]. Today we recommend the following:  Orders Placed This Encounter  Procedures  . COMPLETE METABOLIC PANEL WITH GFR  . Hepatitis B Core Antibody, total  . Hepatitis B Surface AntiBODY  . Hepatitis B Surface AntiGEN  . Hepatitis C Antibody  . QuantiFERON-TB Gold Plus  . CBC with Differential   Meds ordered this encounter  Medications  . methotrexate (RHEUMATREX) 2.5 MG tablet    Sig: Take 2 tablets (5 mg total) by mouth once a week. Caution:Chemotherapy. Protect from light. Start 5 mg weekly; increased by 2.5 mg every 2 weeks until a dose of 10mg  reached    Dispense:  36 tablet    Refill:  0  . predniSONE (DELTASONE) 20 MG tablet    Sig: Take 2 tablets (40 mg total) by mouth daily with breakfast.    Dispense:  180 tablet    Refill:  0  . sulfamethoxazole-trimethoprim (BACTRIM DS) 800-160 MG tablet    Sig: Take 1 tablet by mouth 3 (three) times a week.    Dispense:  36 tablet    Refill:  0  . folic acid (FOLVITE) 1 MG tablet    Sig: Take 1 tablet (1 mg total) by mouth daily.    Dispense:  90 tablet    Refill:  0   Return in about 6 weeks (around 04/23/2019).    Please do your part to reduce the spread of COVID-19.

## 2019-03-14 LAB — COMPLETE METABOLIC PANEL WITH GFR
AG Ratio: 2.9 (calc) — ABNORMAL HIGH (ref 1.0–2.5)
ALT: 20 U/L (ref 6–29)
AST: 23 U/L (ref 10–35)
Albumin: 4.3 g/dL (ref 3.6–5.1)
Alkaline phosphatase (APISO): 38 U/L (ref 37–153)
BUN: 13 mg/dL (ref 7–25)
CO2: 30 mmol/L (ref 20–32)
Calcium: 9.6 mg/dL (ref 8.6–10.4)
Chloride: 103 mmol/L (ref 98–110)
Creat: 0.72 mg/dL (ref 0.50–0.99)
GFR, Est African American: 104 mL/min/{1.73_m2} (ref >=60)
GFR, Est Non African American: 90 mL/min/{1.73_m2} (ref >=60)
Globulin: 1.5 g/dL (calc) — ABNORMAL LOW (ref 1.9–3.7)
Glucose, Bld: 84 mg/dL (ref 65–99)
Potassium: 3.4 mmol/L — ABNORMAL LOW (ref 3.5–5.3)
Sodium: 141 mmol/L (ref 135–146)
Total Bilirubin: 1 mg/dL (ref 0.2–1.2)
Total Protein: 5.8 g/dL — ABNORMAL LOW (ref 6.1–8.1)

## 2019-03-14 LAB — QUANTIFERON-TB GOLD PLUS
Mitogen-NIL: >10 IU/mL
NIL: 0.03 IU/mL
QuantiFERON-TB Gold Plus: NEGATIVE
TB1-NIL: 0 IU/mL
TB2-NIL: 0 IU/mL

## 2019-03-14 LAB — HEPATITIS B SURFACE ANTIGEN: Hepatitis B Surface Ag: NONREACTIVE

## 2019-03-14 LAB — HEPATITIS C ANTIBODY
Hepatitis C Ab: NONREACTIVE
SIGNAL TO CUT-OFF: 0.01 (ref <1.00)

## 2019-03-19 ENCOUNTER — Other Ambulatory Visit: Payer: Self-pay | Admitting: Internal Medicine

## 2019-03-19 DIAGNOSIS — C44719 Basal cell carcinoma of skin of left lower limb, including hip: Secondary | ICD-10-CM | POA: Diagnosis not present

## 2019-03-19 DIAGNOSIS — C44619 Basal cell carcinoma of skin of left upper limb, including shoulder: Secondary | ICD-10-CM | POA: Diagnosis not present

## 2019-03-21 ENCOUNTER — Other Ambulatory Visit: Payer: Self-pay | Admitting: Allergy and Immunology

## 2019-04-04 ENCOUNTER — Other Ambulatory Visit: Payer: Self-pay

## 2019-04-04 DIAGNOSIS — D869 Sarcoidosis, unspecified: Secondary | ICD-10-CM

## 2019-04-04 MED ORDER — METHOTREXATE 2.5 MG PO TABS: 10.0000 mg | ORAL_TABLET | ORAL | 0 refills | Status: DC

## 2019-04-17 ENCOUNTER — Encounter: Payer: Self-pay | Admitting: Pulmonary Disease

## 2019-04-17 ENCOUNTER — Ambulatory Visit: Payer: BC Managed Care – PPO | Admitting: Pulmonary Disease

## 2019-04-17 ENCOUNTER — Other Ambulatory Visit: Payer: Self-pay

## 2019-04-17 VITALS — BP 124/80 | HR 79 | Temp 97.9°F | Ht 64.0 in | Wt 124.0 lb

## 2019-04-17 DIAGNOSIS — Z79899 Other long term (current) drug therapy: Secondary | ICD-10-CM

## 2019-04-17 DIAGNOSIS — Z5181 Encounter for therapeutic drug level monitoring: Secondary | ICD-10-CM | POA: Diagnosis not present

## 2019-04-17 DIAGNOSIS — R05 Cough: Secondary | ICD-10-CM | POA: Diagnosis not present

## 2019-04-17 DIAGNOSIS — Z79631 Long term (current) use of antimetabolite agent: Secondary | ICD-10-CM

## 2019-04-17 DIAGNOSIS — R059 Cough, unspecified: Secondary | ICD-10-CM

## 2019-04-17 DIAGNOSIS — D869 Sarcoidosis, unspecified: Secondary | ICD-10-CM | POA: Diagnosis not present

## 2019-04-17 MED ORDER — PANTOPRAZOLE SODIUM 40 MG PO TBEC: 40.0000 mg | DELAYED_RELEASE_TABLET | Freq: Every day | ORAL | 3 refills | Status: DC

## 2019-04-17 NOTE — Progress Notes (Signed)
Synopsis: Referred in March 2020 for abnormal CT of the chest by Elby Showers, MD  Subjective:   PATIENT ID: Tamara Myers Macy GENDER: female DOB: [birth year], MRN: [id number]  Chief Complaint  Patient presents with  . Follow-up    Patient has had complaints of progressive cough for the past year.  She was recently placed on a PPI for treatment of reflux.  She was ultimately referred to allergy.  She saw Dr. Maudie Mercury which evaluated for her cough.  She had a chest x-ray which revealed interstitial markings.  She ultimately had a noncontrast CT of the chest which revealed perilymphatic micro-nodularity consistent with pulmonary sarcoidosis with small areas of bilateral hilar adenopathy and a right-sided 1.4 cm hilar lymph node.  She had a dominant right-sided right lower lobe nodule that measured 1 cm in size also likely secondary to sarcoid however recommending a 3 to 27-monthfollow-up.  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  Father died of pulmonary fibrosis.  Patient has not had any eye or skin symptoms.  OV 03/12/2019:Patient underwent bronchoscopy November 20, 2018.  Bronchoscopy tissue biopsy revealed granulomatous inflammation.  Follow-up visit was via telephone.  In August 2020.  She was then placed on a 375-monthaper of prednisone with PCP prophylaxis of Bactrim.  Patient here today for follow-up after recent diagnosis of sarcoidosis.  Overall patient doing well today.  She recently completed her 3-42-monthper of prednisone.  Over the past 2 weeks she has noticed increase in her cough.  Today reviewed her CT scan which was completed getting of week which revealed improvement of the nodularity and parenchymal changes consistent with sarcoid however they are still present.  She still has dry cough.  OV 04/17/2019: Patient here today for  follow-up after starting methotrexate and restarting her oral prednisone for sarcoidosis.  Doing well today.  Respiratory status is much improved as to previous.  She does not have significant cough anymore.  She feels a little bit better when it comes to her dyspnea on exertion but is still about the same.  Occasionally has cough but not much.  She has been faithful with taking her medication.  She has uptitrated methotrexate to 10 mg daily.  She is planned for labs today.  She overall denies hemoptysis weight loss, fevers chills night sweats.    Past Medical History:  Diagnosis Date  . [place]  basal [place]  . Complication of anesthesia    slow to wake up  . Dyspnea    on exertion  . GERD (gastroesophageal reflux disease)   . Hypertension   . Melanoma ([id number]   left upper arm  . Moderate persistent asthma without complication 12/95/0/9326 PONV (postoperative nausea and vomiting)   . Post menopausal syndrome      Family History  Problem Relation Age of Onset  . Diabetes Father   . Heart disease Father   . Hyperlipidemia Father   . Hypertension Father   . Pulmonary fibrosis Father   . Asthma Daughter   . Breast cancer Neg Hx   . Allergic rhinitis Neg Hx   . Eczema Neg Hx   . Urticaria Neg Hx   . Angioedema Neg Hx      Past Surgical History:  Procedure Laterality Date  . CESAREAN SECTION    .  OPEN REDUCTION INTERNAL FIXATION (ORIF) METACARPAL Right 11/24/2014   Procedure: OPEN REDUCTION INTERNAL FIXATION (ORIF) RIGHT THUMB;  Surgeon: Iran Planas, MD;  Location: [facility];  Service: Orthopedics;  Laterality: Right;  . thumb surgery Right 2014  . VIDEO BRONCHOSCOPY N/A 11/20/2018   Procedure: Video Bronchoscopy With Fluoro;  Surgeon: Garner Nash, DO;  Location: [facility];  Service: Thoracic;  Laterality: N/A;  . VIDEO BRONCHOSCOPY WITH ENDOBRONCHIAL ULTRASOUND N/A 11/20/2018   Procedure: VIDEO BRONCHOSCOPY WITH ENDOBRONCHIAL ULTRASOUND;  Surgeon: Garner Nash, DO;  Location: [facility];  Service: Thoracic;  Laterality: N/A;    Social History   Socioeconomic History  . Marital status: Married    Spouse name: Not on file  . Number of children: Not on file  . Years of education: Not on file  . Highest education level: Not on file  Occupational History  . Not on file  Tobacco Use  . Smoking status: Never Smoker  . Smokeless tobacco: Never Used  Substance and Sexual Activity  . Alcohol use: No  . Drug use: No  . Sexual activity: Not on file  Other Topics Concern  . Not on file  Social History Narrative  . Not on file   Social Determinants of Health   Financial Resource Strain:   . Difficulty of Paying Living Expenses: Not on file  Food Insecurity:   . Worried About Charity fundraiser in the Last Year: Not on file  . Ran Out of Food in the Last Year: Not on file  Transportation Needs:   . Lack of Transportation (Medical): Not on file  . Lack of Transportation (Non-Medical): Not on file  Physical Activity:   . Days of Exercise per Week: Not on file  . Minutes of Exercise per Session: Not on file  Stress:   . Feeling of Stress : Not on file  Social Connections:   . Frequency of Communication with Friends and Family: Not on file  . Frequency of Social Gatherings with Friends and Family: Not on file  . Attends Religious Services: Not on file  . Active Member of Clubs or Organizations: Not on file  . Attends Archivist Meetings: Not on file  . Marital Status: Not on file  Intimate Partner Violence:   . Fear of Current or Ex-Partner: Not on file  . Emotionally Abused: Not on file  . Physically Abused: Not on file  . Sexually Abused: Not on file     No Known Allergies   Outpatient Medications Prior to Visit  Medication Sig Dispense Refill  . albuterol (PROVENTIL HFA;VENTOLIN HFA) 108 (90 Base) MCG/ACT inhaler Inhale 2 puffs into the lungs every 6 (six) hours as needed for wheezing or shortness of breath. 18 g 1  .  amLODipine (NORVASC) 5 MG tablet TAKE 1 TABLET BY MOUTH EVERY DAY (Patient taking differently: Take 5 mg by mouth daily. ) 30 tablet 11  . Cholecalciferol (VITAMIN D3) 10 MCG (400 UNIT) CAPS Take 400 Units by mouth 2 (two) times a day.    . ergocalciferol (DRISDOL) 1.25 MG (50000 UT) capsule Take 1 capsule (50,000 Units total) by mouth once a week. 24 capsule 0  . Fluticasone-Salmeterol (ADVAIR DISKUS) 100-50 MCG/DOSE AEPB Inhale 1 puff into the lungs 2 (two) times daily. 60 each 3  . folic acid (FOLVITE) 1 MG tablet Take 1 tablet (1 mg total) by mouth daily. 90 tablet 0  . methotrexate (RHEUMATREX) 2.5 MG tablet Take 4 tablets (10  mg total) by mouth once a week. Caution:Chemotherapy. Protect from light. Start 5 mg weekly; increased by 2.5 mg every 2 weeks until a dose of 35m reached 48 tablet 0  . pantoprazole (PROTONIX) 40 MG tablet TAKE 1 TABLET BY MOUTH TWICE A DAY 60 tablet 0  . predniSONE (DELTASONE) 20 MG tablet Take 2 tablets (40 mg total) by mouth daily with breakfast. 180 tablet 0  . rosuvastatin (CRESTOR) 5 MG tablet Take 1 tablet (5 mg total) by mouth daily at 6 PM. 90 tablet 3  . sulfamethoxazole-trimethoprim (BACTRIM DS) 800-160 MG tablet Take 1 tablet by mouth 3 (three) times a week. 36 tablet 0  . triamterene-hydrochlorothiazide (MAXZIDE-25) 37.5-25 MG tablet TAKE 1 TABLET BY MOUTH EVERY DAY (Patient taking differently: Take 1 tablet by mouth daily. ) 90 tablet 2  . Fluticasone-Salmeterol (WIXELA INHUB) 250-50 MCG/DOSE AEPB Inhale 1 puff into the lungs 2 (two) times daily. 180 each 1   No facility-administered medications prior to visit.    Review of Systems  Constitutional: Negative for chills, fever, malaise/fatigue and weight loss.  HENT: Negative for hearing loss, sore throat and tinnitus.   Eyes: Negative for blurred vision and double vision.  Respiratory: Positive for cough and shortness of breath. Negative for hemoptysis, sputum production, wheezing and stridor.    Cardiovascular: Negative for chest pain, palpitations, orthopnea, leg swelling and PND.  Gastrointestinal: Negative for abdominal pain, constipation, diarrhea, heartburn, nausea and vomiting.  Genitourinary: Negative for dysuria, hematuria and urgency.  Musculoskeletal: Negative for joint pain and myalgias.  Skin: Negative for itching and rash.  Neurological: Negative for dizziness, tingling, weakness and headaches.  Endo/Heme/Allergies: Negative for environmental allergies. Does not bruise/bleed easily.  Psychiatric/Behavioral: Negative for depression. The patient is not nervous/anxious and does not have insomnia.   All other systems reviewed and are negative.    Objective:  Physical Exam Vitals reviewed.  Constitutional:      General: She is not in acute distress.    Appearance: She is well-developed.     Comments: Thin  HENT:     Head: Normocephalic and atraumatic.  Eyes:     General: No scleral icterus.    Conjunctiva/sclera: Conjunctivae normal.     Pupils: Pupils are equal, round, and reactive to light.  Neck:     Vascular: No JVD.     Trachea: No tracheal deviation.  Cardiovascular:     Rate and Rhythm: Normal rate and regular rhythm.     Heart sounds: Normal heart sounds. No murmur.  Pulmonary:     Effort: Pulmonary effort is normal. No tachypnea, accessory muscle usage or respiratory distress.     Breath sounds: Normal breath sounds. No stridor. No wheezing, rhonchi or rales.  Abdominal:     General: Bowel sounds are normal. There is no distension.     Palpations: Abdomen is soft.     Tenderness: There is no abdominal tenderness.  Musculoskeletal:        General: No tenderness.     Cervical back: Neck supple.  Lymphadenopathy:     Cervical: No cervical adenopathy.  Skin:    General: Skin is warm and dry.     Capillary Refill: Capillary refill takes less than 2 seconds.     Findings: No rash.  Neurological:     Mental Status: She is alert and oriented to  person, place, and time.  Psychiatric:        Behavior: Behavior normal.      Vitals:   04/17/19  1617  BP: 124/80  Pulse: 79  Temp: 97.9 F (36.6 C)  TempSrc: Oral  SpO2: 98%  Weight: 124 lb (56.2 kg)  Height: _0  (1.626 m)   98% on RA BMI Readings from Last 3 Encounters:  04/17/19 21.28 kg/m  03/12/19 21.11 kg/m  01/02/19 21.79 kg/m   Wt Readings from Last 3 Encounters:  04/17/19 124 lb (56.2 kg)  03/12/19 123 lb (55.8 kg)  01/02/19 123 lb (55.8 kg)     CBC    Component Value Date/Time   WBC 3.5 (L) 03/12/2019 0944   RBC 4.36 03/12/2019 0944   HGB 13.8 03/12/2019 0944   HCT 40.3 03/12/2019 0944   PLT 222.0 03/12/2019 0944   MCV 92.3 03/12/2019 0944   MCV 88.1 07/29/2011 1126   MCH 30.0 12/27/2018 0930   MCHC 34.3 03/12/2019 0944   RDW 13.3 03/12/2019 0944   LYMPHSABS 0.4 (L) 03/12/2019 0944   MONOABS 0.4 03/12/2019 0944   EOSABS 0.0 03/12/2019 0944   BASOSABS 0.0 03/12/2019 0944    Chest Imaging: CT chest 07/03/2018: Evidence of peri-lymphatic bilateral micro-nodularity consistent with sarcoidosis, small mediastinal and hilar lymph nodes.  And a right lower lobe 1 cm nodule likely consistent with the sarcoid however recommending 3 to 51-monthfollow-up by radiology. The patient's images have been independently reviewed by me.    November 2020: CT chest with perilymphatic bilateral micronodularity which is improved in comparison to her previous imaging. The patient's images have been independently reviewed by me.    Pulmonary Functions Testing Results: PFT Results Latest Ref Rng & Units 11/06/2018 06/27/2018  FVC-Pre L 2.86 2.82  FVC-Predicted Pre % 88 86  FVC-Post L 2.92 2.82  FVC-Predicted Post % 89 86  Pre FEV1/FVC % % 74 65  Post FEV1/FCV % % 70 76  FEV1-Pre L 2.13 1.83  FEV1-Predicted Pre % 85 73  FEV1-Post L 2.04 2.13  DLCO UNC% % 100 99  DLCO COR %Predicted % 124 109  TLC L 4.74 4.57  TLC % Predicted % 93 90  RV % Predicted % 94 80      FeNO: None   Pathology:   Bronchoscopy July 2020: Lung, biopsy, right middle lobe - BENIGN LUNG PARENCHYMA WITH GRANULOMATOUS INFLAMMATION. - THERE IS NO EVIDENCE OF MALIGNANCY.  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  She failed tapering off of initial prednisone dosing.  Patient was started on methotrexate in November 2020.  Currently uptitrated to 10 mg weekly with 40 mg of prednisone.  Overall doing well.  Plan: Continue methotrexate 10 mg weekly Prednisone 40 mg daily Bactrim prophylaxis 3 times a week. PPI for GI prophylaxis Folic acid Repeat CBC plus BMP plus hepatic function today Additional repeat labs in 1 month.  Patient was instructed to come back in 1 month to have labs checked.  Does not need office visit at this time.  Will call with lab results. Patient was once again counseled on the adverse effects of methotrexate.  And the importance of drug monitoring. We will plan to use start tapering her prednisone in May 2021 Likely repeat chest imaging in May along with PFTs.  Can return to our clinic  and see me in 2 months  Methotrexate Dosing for Sarcoidosis Treatment:   Starting dose of MTX for sarcoidosis is 5 mg weekly; it is increased gradually by increments of 2.5 mg every 2 weeks until a dose of 10-15 mg per week is reached. If patients develop refractory nausea/vomiting with oral MTX, it can be switched to the intramuscular route. Because the effects of MTX may not be seen until 6 months after initiation, tapering of steroids should be delayed for several months after initiating MTX.  Common adverse effects of MTX include leukopenia,  nausea/vomiting and mucositis. Drug-induced pneumonitis is a rare but serious dose-dependent complication occurring months to years after treatment and has to be considered with worsening lung disease.   Prior to the initiation of therapy, it is important to obtain baseline LFT, CBC, serum creatinine, and serologies for hepatitis B and C viruses as well as testing for latent mycobacterium tuberculosis infection. Patients with underlying liver disease, advanced chronic kidney disease (GFR <30 ml/min), and chronic viral hepatitis are not candidates for MTX therapy.  Greater than 50% of this patient's 40-minute of visit was spent face-to-face discussing above recommendations and treatment plan.   Current Outpatient Medications:  .  albuterol (PROVENTIL HFA;VENTOLIN HFA) 108 (90 Base) MCG/ACT inhaler, Inhale 2 puffs into the lungs every 6 (six) hours as needed for wheezing or shortness of breath., Disp: 18 g, Rfl: 1 .  amLODipine (NORVASC) 5 MG tablet, TAKE 1 TABLET BY MOUTH EVERY DAY (Patient taking differently: Take 5 mg by mouth daily. ), Disp: 30 tablet, Rfl: 11 .  Cholecalciferol (VITAMIN D3) 10 MCG (400 UNIT) CAPS, Take 400 Units by mouth 2 (two) times a day., Disp: , Rfl:  .  ergocalciferol (DRISDOL) 1.25 MG (50000 UT) capsule, Take 1 capsule (50,000 Units total) by mouth once a week., Disp: 24 capsule, Rfl: 0 .  Fluticasone-Salmeterol (ADVAIR DISKUS) 100-50 MCG/DOSE AEPB, Inhale 1 puff into the lungs 2 (two) times daily., Disp: 60 each, Rfl: 3 .  folic acid (FOLVITE) 1 MG tablet, Take 1 tablet (1 mg total) by mouth daily., Disp: 90 tablet, Rfl: 0 .  methotrexate (RHEUMATREX) 2.5 MG tablet, Take 4 tablets (10 mg total) by mouth once a week. Caution:Chemotherapy. Protect from light. Start 5 mg weekly; increased by 2.5 mg every 2 weeks until a dose of 49m reached, Disp: 48 tablet, Rfl: 0 .  pantoprazole (PROTONIX) 40 MG tablet, TAKE 1 TABLET BY MOUTH TWICE A DAY, Disp: 60 tablet, Rfl: 0 .   predniSONE (DELTASONE) 20 MG tablet, Take 2 tablets (40 mg total) by mouth daily with breakfast., Disp: 180 tablet, Rfl: 0 .  rosuvastatin (CRESTOR) 5 MG tablet, Take 1 tablet (5 mg total) by mouth daily at 6 PM., Disp: 90 tablet, Rfl: 3 .  sulfamethoxazole-trimethoprim (BACTRIM DS) 800-160 MG tablet, Take 1 tablet by mouth 3 (three) times a week., Disp: 36 tablet, Rfl: 0 .  triamterene-hydrochlorothiazide (MAXZIDE-25) 37.5-25 MG tablet, TAKE 1 TABLET BY MOUTH EVERY DAY (Patient taking differently: Take 1 tablet by mouth daily. ), Disp: 90 tablet, Rfl: 2 .  Fluticasone-Salmeterol (WIXELA INHUB) 250-50 MCG/DOSE AEPB, Inhale 1 puff into the lungs 2 (two) times daily., Disp: 180 each, Rfl: 1   BGarner Nash DO [facility] 04/17/2019 4:33 PM

## 2019-04-17 NOTE — Patient Instructions (Addendum)
Thank you for visiting Dr. Valeta Harms at [facility]. Today we recommend the following:  Orders Placed This Encounter  Procedures  . CBC w/Diff  . Comp Met (CMET)  . CBC w/Diff  . Comp Met (CMET)   Stay on vitamin d supplement  Refill protonix Continue MTX 33m weekly  Continue 459mprednisone  Continue folic acid   Labs today  Repeat labs in one month (please mark calender)   Return in about 9 weeks (around 06/19/2019) for with APP or Dr. IcValeta Harms   Please do your part to reduce the spread of COVID-19.

## 2019-04-18 LAB — CBC WITH DIFFERENTIAL/PLATELET
Basophils Absolute: 0 10*3/uL (ref 0.0–0.1)
Basophils Relative: 0.1 % (ref 0.0–3.0)
Eosinophils Absolute: 0 10*3/uL (ref 0.0–0.7)
Eosinophils Relative: 0.2 % (ref 0.0–5.0)
HCT: 41.9 % (ref 36.0–46.0)
Hemoglobin: 14.3 g/dL (ref 12.0–15.0)
Lymphocytes Relative: 3.7 % — ABNORMAL LOW (ref 12.0–46.0)
Lymphs Abs: 0.3 10*3/uL — ABNORMAL LOW (ref 0.7–4.0)
MCHC: 34.2 g/dL (ref 30.0–36.0)
MCV: 94.8 fl (ref 78.0–100.0)
Monocytes Absolute: 0.4 10*3/uL (ref 0.1–1.0)
Monocytes Relative: 4.6 % (ref 3.0–12.0)
Neutro Abs: 8 10*3/uL — ABNORMAL HIGH (ref 1.4–7.7)
Neutrophils Relative %: 91.4 % — ABNORMAL HIGH (ref 43.0–77.0)
Platelets: 246 10*3/uL (ref 150.0–400.0)
RBC: 4.42 Mil/uL (ref 3.87–5.11)
RDW: 13.1 % (ref 11.5–15.5)
WBC: 8.8 10*3/uL (ref 4.0–10.5)

## 2019-04-18 LAB — COMPREHENSIVE METABOLIC PANEL
ALT: 28 U/L (ref 0–35)
AST: 16 U/L (ref 0–37)
Albumin: 4.5 g/dL (ref 3.5–5.2)
Alkaline Phosphatase: 51 U/L (ref 39–117)
BUN: 18 mg/dL (ref 6–23)
CO2: 33 mEq/L — ABNORMAL HIGH (ref 19–32)
Calcium: 9.7 mg/dL (ref 8.4–10.5)
Chloride: 100 mEq/L (ref 96–112)
Creatinine, Ser: 0.92 mg/dL (ref 0.40–1.20)
GFR: 61.65 mL/min (ref >60.00)
Glucose, Bld: 104 mg/dL — ABNORMAL HIGH (ref 70–99)
Potassium: 4.5 mEq/L (ref 3.5–5.1)
Sodium: 140 mEq/L (ref 135–145)
Total Bilirubin: 0.7 mg/dL (ref 0.2–1.2)
Total Protein: 6.3 g/dL (ref 6.0–8.3)

## 2019-05-19 ENCOUNTER — Other Ambulatory Visit: Payer: BC Managed Care – PPO

## 2019-05-19 DIAGNOSIS — Z5181 Encounter for therapeutic drug level monitoring: Secondary | ICD-10-CM

## 2019-05-19 DIAGNOSIS — Z79899 Other long term (current) drug therapy: Secondary | ICD-10-CM

## 2019-05-19 LAB — CBC WITH DIFFERENTIAL/PLATELET
Basophils Absolute: 0 10*3/uL (ref 0.0–0.1)
Basophils Relative: 0.2 % (ref 0.0–3.0)
Eosinophils Absolute: 0 10*3/uL (ref 0.0–0.7)
Eosinophils Relative: 0.1 % (ref 0.0–5.0)
HCT: 41.7 % (ref 36.0–46.0)
Hemoglobin: 14.2 g/dL (ref 12.0–15.0)
Lymphocytes Relative: 4.8 % — ABNORMAL LOW (ref 12.0–46.0)
Lymphs Abs: 0.3 10*3/uL — ABNORMAL LOW (ref 0.7–4.0)
MCHC: 34 g/dL (ref 30.0–36.0)
MCV: 96.4 fl (ref 78.0–100.0)
Monocytes Absolute: 0.3 10*3/uL (ref 0.1–1.0)
Monocytes Relative: 5.5 % (ref 3.0–12.0)
Neutro Abs: 5.4 10*3/uL (ref 1.4–7.7)
Neutrophils Relative %: 89.4 % — ABNORMAL HIGH (ref 43.0–77.0)
Platelets: 240 10*3/uL (ref 150.0–400.0)
RBC: 4.32 Mil/uL (ref 3.87–5.11)
RDW: 13.9 % (ref 11.5–15.5)
WBC: 6 10*3/uL (ref 4.0–10.5)

## 2019-05-19 LAB — COMPREHENSIVE METABOLIC PANEL
ALT: 26 U/L (ref 0–35)
AST: 21 U/L (ref 0–37)
Albumin: 4.2 g/dL (ref 3.5–5.2)
Alkaline Phosphatase: 39 U/L (ref 39–117)
BUN: 13 mg/dL (ref 6–23)
CO2: 31 mEq/L (ref 19–32)
Calcium: 9.6 mg/dL (ref 8.4–10.5)
Chloride: 100 mEq/L (ref 96–112)
Creatinine, Ser: 0.93 mg/dL (ref 0.40–1.20)
GFR: 60.87 mL/min (ref >60.00)
Glucose, Bld: 96 mg/dL (ref 70–99)
Potassium: 3.5 mEq/L (ref 3.5–5.1)
Sodium: 141 mEq/L (ref 135–145)
Total Bilirubin: 0.9 mg/dL (ref 0.2–1.2)
Total Protein: 6 g/dL (ref 6.0–8.3)

## 2019-05-22 ENCOUNTER — Other Ambulatory Visit: Payer: Self-pay | Admitting: Emergency Medicine

## 2019-05-22 MED ORDER — FLUTICASONE-SALMETEROL 100-50 MCG/DOSE IN AEPB: 1.0000 | INHALATION_SPRAY | Freq: Two times a day (BID) | RESPIRATORY_TRACT | 3 refills | Status: DC

## 2019-06-03 ENCOUNTER — Other Ambulatory Visit: Payer: Self-pay

## 2019-06-03 DIAGNOSIS — D869 Sarcoidosis, unspecified: Secondary | ICD-10-CM

## 2019-06-03 MED ORDER — FOLIC ACID 1 MG PO TABS: 1.0000 mg | ORAL_TABLET | Freq: Every day | ORAL | 1 refills | Status: AC

## 2019-06-03 MED ORDER — PREDNISONE 20 MG PO TABS: 40.0000 mg | ORAL_TABLET | Freq: Every day | ORAL | 0 refills | Status: DC

## 2019-06-03 MED ORDER — SULFAMETHOXAZOLE-TRIMETHOPRIM 800-160 MG PO TABS: 1.0000 | ORAL_TABLET | ORAL | 1 refills | Status: DC

## 2019-06-04 ENCOUNTER — Other Ambulatory Visit: Payer: Self-pay | Admitting: Internal Medicine

## 2019-06-23 ENCOUNTER — Other Ambulatory Visit (INDEPENDENT_AMBULATORY_CARE_PROVIDER_SITE_OTHER): Payer: BC Managed Care – PPO

## 2019-06-23 DIAGNOSIS — J4540 Moderate persistent asthma, uncomplicated: Secondary | ICD-10-CM

## 2019-06-23 LAB — CBC WITH DIFFERENTIAL/PLATELET
Basophils Absolute: 0 10*3/uL (ref 0.0–0.1)
Basophils Relative: 0.1 % (ref 0.0–3.0)
Eosinophils Absolute: 0 10*3/uL (ref 0.0–0.7)
Eosinophils Relative: 0 % (ref 0.0–5.0)
HCT: 43.1 % (ref 36.0–46.0)
Hemoglobin: 14.5 g/dL (ref 12.0–15.0)
Lymphocytes Relative: 2 % — ABNORMAL LOW (ref 12.0–46.0)
Lymphs Abs: 0.1 10*3/uL — ABNORMAL LOW (ref 0.7–4.0)
MCHC: 33.8 g/dL (ref 30.0–36.0)
MCV: 97.5 fl (ref 78.0–100.0)
Monocytes Absolute: 0.1 10*3/uL (ref 0.1–1.0)
Monocytes Relative: 1.3 % — ABNORMAL LOW (ref 3.0–12.0)
Neutro Abs: 7.1 10*3/uL (ref 1.4–7.7)
Neutrophils Relative %: 96.6 % — ABNORMAL HIGH (ref 43.0–77.0)
Platelets: 250 10*3/uL (ref 150.0–400.0)
RBC: 4.42 Mil/uL (ref 3.87–5.11)
RDW: 15 % (ref 11.5–15.5)
WBC: 7.3 10*3/uL (ref 4.0–10.5)

## 2019-06-24 ENCOUNTER — Other Ambulatory Visit: Payer: Self-pay

## 2019-06-24 DIAGNOSIS — D869 Sarcoidosis, unspecified: Secondary | ICD-10-CM

## 2019-06-24 LAB — COMPREHENSIVE METABOLIC PANEL
ALT: 38 U/L — ABNORMAL HIGH (ref 0–35)
AST: 27 U/L (ref 0–37)
Albumin: 4.7 g/dL (ref 3.5–5.2)
Alkaline Phosphatase: 42 U/L (ref 39–117)
BUN: 14 mg/dL (ref 6–23)
CO2: 29 mEq/L (ref 19–32)
Calcium: 9.9 mg/dL (ref 8.4–10.5)
Chloride: 99 mEq/L (ref 96–112)
Creatinine, Ser: 0.9 mg/dL (ref 0.40–1.20)
GFR: 63.2 mL/min (ref >60.00)
Glucose, Bld: 117 mg/dL — ABNORMAL HIGH (ref 70–99)
Potassium: 3.5 mEq/L (ref 3.5–5.1)
Sodium: 136 mEq/L (ref 135–145)
Total Bilirubin: 1.6 mg/dL — ABNORMAL HIGH (ref 0.2–1.2)
Total Protein: 6.6 g/dL (ref 6.0–8.3)

## 2019-06-24 MED ORDER — METHOTREXATE 2.5 MG PO TABS: 10.0000 mg | ORAL_TABLET | ORAL | 0 refills | Status: DC

## 2019-06-25 ENCOUNTER — Other Ambulatory Visit: Payer: Self-pay

## 2019-06-25 ENCOUNTER — Encounter: Payer: Self-pay | Admitting: Pulmonary Disease

## 2019-06-25 ENCOUNTER — Ambulatory Visit: Payer: BC Managed Care – PPO | Admitting: Pulmonary Disease

## 2019-06-25 ENCOUNTER — Other Ambulatory Visit: Payer: Self-pay | Admitting: Internal Medicine

## 2019-06-25 VITALS — BP 112/72 | HR 78 | Ht 64.0 in | Wt 126.6 lb

## 2019-06-25 DIAGNOSIS — Z5181 Encounter for therapeutic drug level monitoring: Secondary | ICD-10-CM | POA: Diagnosis not present

## 2019-06-25 DIAGNOSIS — Z79899 Other long term (current) drug therapy: Secondary | ICD-10-CM | POA: Diagnosis not present

## 2019-06-25 DIAGNOSIS — D869 Sarcoidosis, unspecified: Secondary | ICD-10-CM

## 2019-06-25 DIAGNOSIS — R05 Cough: Secondary | ICD-10-CM | POA: Diagnosis not present

## 2019-06-25 DIAGNOSIS — R059 Cough, unspecified: Secondary | ICD-10-CM

## 2019-06-25 DIAGNOSIS — Z79631 Long term (current) use of antimetabolite agent: Secondary | ICD-10-CM

## 2019-06-25 NOTE — Patient Instructions (Addendum)
Thank you for visiting Dr. Valeta Harms at [facility]. Today we recommend the following:  Orders Placed This Encounter  Procedures  . CT CHEST HIGH RESOLUTION  . CBC with Differential  . COMPLETE METABOLIC PANEL WITH GFR   Drop prednisone to 30mg  daily for next 4 weeks Then decrease to 20mg  daily Continue MTX 10mg  once weekly Continue folic acid supplement and bactrim MWF Repeat blood work prior to next office visit Call me if you have trouble!  Return in about 3 months (around 10/07/2019) for with APP, Wyn Quaker, NP (Dr. Valeta Harms will be out the first few weeks of June) .    Please do your part to reduce the spread of COVID-19.

## 2019-06-25 NOTE — Progress Notes (Signed)
Synopsis: Referred in March 2020 for abnormal CT of the chest by Elby Showers, MD  Subjective:   PATIENT ID: Tamara Myers GENDER: female DOB: 05/03/1955, MRN: [id number]  Chief Complaint  Patient presents with  . Follow-up    Pt states she has been doing okay since last visit. States she has been coughing which is worse in the evenings.    Patient has had complaints of progressive cough for the past year.  She was recently placed on a PPI for treatment of reflux.  She was ultimately referred to allergy.  She saw Dr. Maudie Mercury which evaluated for her cough.  She had a chest x-ray which revealed interstitial markings.  She ultimately had a noncontrast CT of the chest which revealed perilymphatic micro-nodularity consistent with pulmonary sarcoidosis with small areas of bilateral hilar adenopathy and a right-sided 1.4 cm hilar lymph node.  She had a dominant right-sided right lower lobe nodule that measured 1 cm in size also likely secondary to sarcoid however recommending a 3 to 29-monthfollow-up.  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  Father died of pulmonary fibrosis.  Patient has not had any eye or skin symptoms.  OV 03/12/2019:Patient underwent bronchoscopy November 20, 2018.  Bronchoscopy tissue biopsy revealed granulomatous inflammation.  Follow-up visit was via telephone.  In August 2020.  She was then placed on a 317-monthaper of prednisone with PCP prophylaxis of Bactrim.  Patient here today for follow-up after recent diagnosis of sarcoidosis.  Overall patient doing well today.  She recently completed her 3-82-monthper of prednisone.  Over the past 2 weeks she has noticed increase in her cough.  Today reviewed her CT scan which was completed getting of week which revealed improvement of the nodularity and parenchymal changes consistent  with sarcoid however they are still present.  She still has dry cough.  OV 04/17/2019: Patient here today for follow-up after starting methotrexate and restarting her oral prednisone for sarcoidosis.  Doing well today.  Respiratory status is much improved as to previous.  She does not have significant cough anymore.  She feels a little bit better when it comes to her dyspnea on exertion but is still about the same.  Occasionally has cough but not much.  She has been faithful with taking her medication.  She has uptitrated methotrexate to 10 mg daily.  She is planned for labs today.  She overall denies hemoptysis weight loss, fevers chills night sweats.  OV 06/25/2019: Respiratory symptoms stable at this time.  She does have some exertional dyspnea when she does yoga in the evenings.  Otherwise she is able to complete most activities of daily living.  Still trying to stay social distance.  She has noticed some puffiness in her cheeks that she feels may be related to fluid retention from her steroids.  She has noticed easy bruising as well.  Also right eye had a subconjunctival hemorrhage which was new that occurred spontaneously.  Patient denies hemoptysis fevers chills night sweats weight loss.    Past Medical History:  Diagnosis Date  . Cancer ([facility]  basal cell carcinoma  . Complication of anesthesia    slow to wake up  . Dyspnea    on exertion  . GERD (gastroesophageal reflux disease)   . Hypertension   . [place]oma ([facility]  left upper arm  . Moderate persistent asthma without complication 00/05/7492  . PONV (postoperative nausea and vomiting)   . Post menopausal syndrome      Family History  Problem Relation Age of Onset  . Diabetes Father   . Heart disease Father   . Hyperlipidemia Father   . Hypertension Father   . Pulmonary fibrosis Father   . Asthma Daughter   . Breast cancer Neg Hx   . Allergic rhinitis Neg Hx   . Eczema Neg Hx   . Urticaria Neg Hx   . Angioedema Neg Hx       Past Surgical History:  Procedure Laterality Date  . CESAREAN SECTION    . OPEN REDUCTION INTERNAL FIXATION (ORIF) METACARPAL Right 11/24/2014   Procedure: OPEN REDUCTION INTERNAL FIXATION (ORIF) RIGHT THUMB;  Surgeon: Iran Planas, MD;  Location: [facility];  Service: Orthopedics;  Laterality: Right;  . thumb surgery Right 2014  . VIDEO BRONCHOSCOPY N/A 11/20/2018   Procedure: Video Bronchoscopy With Fluoro;  Surgeon: Garner Nash, DO;  Location: [facility];  Service: Thoracic;  Laterality: N/A;  . VIDEO BRONCHOSCOPY WITH ENDOBRONCHIAL ULTRASOUND N/A 11/20/2018   Procedure: VIDEO BRONCHOSCOPY WITH ENDOBRONCHIAL ULTRASOUND;  Surgeon: Garner Nash, DO;  Location: [facility];  Service: Thoracic;  Laterality: N/A;    Social History   Socioeconomic History  . Marital status: Married    Spouse name: Not on file  . Number of children: Not on file  . Years of education: Not on file  . Highest education level: Not on file  Occupational History  . Not on file  Tobacco Use  . Smoking status: Never Smoker  . Smokeless tobacco: Never Used  Substance and Sexual Activity  . Alcohol use: No  . Drug use: No  . Sexual activity: Not on file  Other Topics Concern  . Not on file  Social History Narrative  . Not on file   Social Determinants of Health   Financial Resource Strain:   . Difficulty of Paying Living Expenses: Not on file  Food Insecurity:   . Worried About Charity fundraiser in the Last Year: Not on file  . Ran Out of Food in the Last Year: Not on file  Transportation Needs:   . Lack of Transportation (Medical): Not on file  . Lack of Transportation (Non-Medical): Not on file  Physical Activity:   . Days of Exercise per Week: Not on file  . Minutes of Exercise per Session: Not on file  Stress:   . Feeling of Stress : Not on file  Social Connections:   . Frequency of Communication with Friends and Family: Not on file  . Frequency of Social Gatherings with Friends and Family:  Not on file  . Attends Religious Services: Not on file  . Active Member of Clubs or Organizations: Not on file  . Attends Archivist Meetings: Not on file  . Marital Status: Not on file  Intimate Partner Violence:   . Fear of Current or Ex-Partner: Not on file  . Emotionally Abused: Not on file  . Physically Abused: Not on file  . Sexually Abused: Not on file     No Known Allergies   Outpatient Medications Prior to Visit  Medication Sig Dispense Refill  . albuterol (PROVENTIL HFA;VENTOLIN HFA) 108 (90 Base) MCG/ACT inhaler Inhale 2 puffs into the lungs every 6 (six) hours as needed for wheezing or shortness of breath. 18 g 1  . amLODipine (NORVASC)  5 MG tablet TAKE 1 TABLET BY MOUTH EVERY DAY 90 tablet 3  . Cholecalciferol (VITAMIN D3) 10 MCG (400 UNIT) CAPS Take 400 Units by mouth 2 (two) times a day.    . ergocalciferol (DRISDOL) 1.25 MG (50000 UT) capsule Take 1 capsule (50,000 Units total) by mouth once a week. 24 capsule 0  . Fluticasone-Salmeterol (ADVAIR DISKUS) 100-50 MCG/DOSE AEPB Inhale 1 puff into the lungs 2 (two) times daily. 60 each 3  . folic acid (FOLVITE) 1 MG tablet Take 1 tablet (1 mg total) by mouth daily. 90 tablet 1  . methotrexate (RHEUMATREX) 2.5 MG tablet Take 4 tablets (10 mg total) by mouth once a week. Caution:Chemotherapy. Protect from light. Start 5 mg weekly; increased by 2.5 mg every 2 weeks until a dose of 13m reached 48 tablet 0  . pantoprazole (PROTONIX) 40 MG tablet Take 1 tablet (40 mg total) by mouth daily. 90 tablet 3  . predniSONE (DELTASONE) 20 MG tablet Take 2 tablets (40 mg total) by mouth daily with breakfast. 180 tablet 0  . rosuvastatin (CRESTOR) 5 MG tablet Take 1 tablet (5 mg total) by mouth daily at 6 PM. 90 tablet 3  . sulfamethoxazole-trimethoprim (BACTRIM DS) 800-160 MG tablet Take 1 tablet by mouth 3 (three) times a week. 36 tablet 1  . triamterene-hydrochlorothiazide (MAXZIDE-25) 37.5-25 MG tablet TAKE 1 TABLET BY MOUTH EVERY  DAY 90 tablet 2  . Fluticasone-Salmeterol (WIXELA INHUB) 250-50 MCG/DOSE AEPB Inhale 1 puff into the lungs 2 (two) times daily. 180 each 1   No facility-administered medications prior to visit.    Review of Systems  Constitutional: Negative for chills, fever, malaise/fatigue and weight loss.  HENT: Negative for hearing loss, sore throat and tinnitus.   Eyes: Negative for blurred vision and double vision.  Respiratory: Positive for shortness of breath. Negative for cough, hemoptysis, sputum production, wheezing and stridor.   Cardiovascular: Negative for chest pain, palpitations, orthopnea, leg swelling and PND.  Gastrointestinal: Negative for abdominal pain, constipation, diarrhea, heartburn, nausea and vomiting.  Genitourinary: Negative for dysuria, hematuria and urgency.  Musculoskeletal: Negative for joint pain and myalgias.  Skin: Negative for itching and rash.  Neurological: Negative for dizziness, tingling, weakness and headaches.  Endo/Heme/Allergies: Negative for environmental allergies. Bruises/bleeds easily.  Psychiatric/Behavioral: Negative for depression. The patient is not nervous/anxious and does not have insomnia.   All other systems reviewed and are negative.    Objective:  Physical Exam Vitals reviewed.  Constitutional:      General: She is not in acute distress.    Appearance: She is well-developed.     Comments: Thin  HENT:     Head: Normocephalic and atraumatic.  Eyes:     General: No scleral icterus.    Conjunctiva/sclera: Conjunctivae normal.     Pupils: Pupils are equal, round, and reactive to light.  Neck:     Vascular: No JVD.     Trachea: No tracheal deviation.  Cardiovascular:     Rate and Rhythm: Normal rate and regular rhythm.     Heart sounds: Normal heart sounds. No murmur.  Pulmonary:     Effort: Pulmonary effort is normal. No tachypnea, accessory muscle usage or respiratory distress.     Breath sounds: No stridor. No wheezing or rhonchi.      Comments: Diminished bilaterally no crackles Abdominal:     General: Bowel sounds are normal. There is no distension.     Palpations: Abdomen is soft.     Tenderness: There is no  abdominal tenderness.  Musculoskeletal:        General: No tenderness.     Cervical back: Neck supple.  Lymphadenopathy:     Cervical: No cervical adenopathy.  Skin:    General: Skin is warm and dry.     Capillary Refill: Capillary refill takes less than 2 seconds.     Findings: No rash.  Neurological:     Mental Status: She is alert and oriented to person, place, and time.  Psychiatric:        Behavior: Behavior normal.      Vitals:   06/25/19 1635  BP: 112/72  Pulse: 78  SpO2: 100%  Weight: 126 lb 9.6 oz (57.4 kg)  Height: 5' 4"  (1.626 m)   100% on RA BMI Readings from Last 3 Encounters:  06/25/19 21.73 kg/m  04/17/19 21.28 kg/m  03/12/19 21.11 kg/m   Wt Readings from Last 3 Encounters:  06/25/19 126 lb 9.6 oz (57.4 kg)  04/17/19 124 lb (56.2 kg)  03/12/19 123 lb (55.8 kg)     CBC    Component Value Date/Time   WBC 7.3 06/23/2019 1220   RBC 4.42 06/23/2019 1220   HGB 14.5 06/23/2019 1220   HCT 43.1 06/23/2019 1220   PLT 250.0 06/23/2019 1220   MCV 97.5 06/23/2019 1220   MCV 88.1 07/29/2011 1126   MCH 30.0 12/27/2018 0930   MCHC 33.8 06/23/2019 1220   RDW 15.0 06/23/2019 1220   LYMPHSABS 0.1 (L) 06/23/2019 1220   MONOABS 0.1 06/23/2019 1220   EOSABS 0.0 06/23/2019 1220   BASOSABS 0.0 06/23/2019 1220    Chest Imaging: CT chest 07/03/2018: Evidence of peri-lymphatic bilateral micro-nodularity consistent with sarcoidosis, small mediastinal and hilar lymph nodes.  And a right lower lobe 1 cm nodule likely consistent with the sarcoid however recommending 3 to 87-monthfollow-up by radiology. The patient's images have been independently reviewed by me.    November 2020: CT chest with perilymphatic bilateral micronodularity which is improved in comparison to her previous  imaging. The patient's images have been independently reviewed by me.    Pulmonary Functions Testing Results: PFT Results Latest Ref Rng & Units 11/06/2018 06/27/2018  FVC-Pre L 2.86 2.82  FVC-Predicted Pre % 88 86  FVC-Post L 2.92 2.82  FVC-Predicted Post % 89 86  Pre FEV1/FVC % % 74 65  Post FEV1/FCV % % 70 76  FEV1-Pre L 2.13 1.83  FEV1-Predicted Pre % 85 73  FEV1-Post L 2.04 2.13  DLCO UNC% % 100 99  DLCO COR %Predicted % 124 109  TLC L 4.74 4.57  TLC % Predicted % 93 90  RV % Predicted % 94 80     FeNO: None   Pathology:   Bronchoscopy July 2020: Lung, biopsy, right middle lobe - BENIGN LUNG PARENCHYMA WITH GRANULOMATOUS INFLAMMATION. - THERE IS NO EVIDENCE OF MALIGNANCY.  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  Failed initial prednisone tapering was started on methotrexate in November 2020.  At this point has been successfully uptitrated to 10 mg methotrexate once weekly plus folic acid supplementation and maintained on 40 mg of oral prednisone.  She has seen increased ease of bruising.  Recent right-sided subconjunctival hemorrhage.  Additionally has elevated glucose on her BMP from this past week at 117.  Suspect a lot of these effects are from her chronic prednisone use.  Plan: We will continue on 10 mg methotrexate once weekly Can drop to 30 mg daily of oral prednisone this still gives her 0.5 mg/kg dosing Continue 30 mg of prednisone for 4 weeks and then she can decrease to 20 mg daily. Patient continue on 20 mg daily until seen in our office  again. We will repeat CBC and CMP at next office visit to be in 1 June. Repeat high-resolution CT scan of the chest in June for a 26-monthimaging follow-up. I will be out of the office at first June.  Therefore will have patient follow-up with BWyn Quaker NP. Continue PCP prophylaxis with Bactrim Monday Wednesday Friday, and folic acid supplementation.  Methotrexate Dosing for Sarcoidosis Treatment:   Starting dose of MTX for sarcoidosis is 5 mg weekly; it is increased gradually by increments of 2.5 mg every 2 weeks until a dose of 10-15 mg per week is reached. If patients develop refractory nausea/vomiting with oral MTX, it can be switched to the intramuscular route. Because the effects of MTX may not be seen until 6 months after initiation, tapering of steroids should be delayed for several months after initiating MTX.  Common adverse effects of MTX include leukopenia, nausea/vomiting and mucositis. Drug-induced pneumonitis is a rare but serious dose-dependent complication occurring months to years after treatment and has to be considered with worsening lung disease.   Prior to the initiation of therapy, it is important to obtain baseline LFT, CBC, serum creatinine, and serologies for hepatitis B and C viruses as well as testing for latent mycobacterium tuberculosis infection. Patients with underlying liver disease, advanced chronic kidney disease (GFR <30 ml/min), and chronic viral hepatitis are not candidates for MTX therapy.  Greater than 50% of this patient's 35-minute office visit was in face-to-face discussing above recommendations and treatment plan.   Current Outpatient Medications:  .  albuterol (PROVENTIL HFA;VENTOLIN HFA) 108 (90 Base) MCG/ACT inhaler, Inhale 2 puffs into the lungs every 6 (six) hours as needed for wheezing or shortness of breath., Disp: 18 g, Rfl: 1 .  amLODipine (NORVASC) 5 MG tablet, TAKE 1 TABLET BY MOUTH EVERY DAY, Disp: 90 tablet, Rfl: 3 .  Cholecalciferol  (VITAMIN D3) 10 MCG (400 UNIT) CAPS, Take 400 Units by mouth 2 (two) times a day., Disp: , Rfl:  .  ergocalciferol (DRISDOL) 1.25 MG (50000 UT) capsule, Take 1 capsule (50,000 Units total) by mouth once a week., Disp: 24 capsule, Rfl: 0 .  Fluticasone-Salmeterol (ADVAIR DISKUS) 100-50 MCG/DOSE AEPB, Inhale 1 puff into the lungs 2 (two) times daily., Disp: 60 each, Rfl: 3 .  folic acid (FOLVITE) 1 MG tablet, Take 1 tablet (1 mg total) by mouth daily., Disp: 90 tablet, Rfl: 1 .  methotrexate (RHEUMATREX) 2.5 MG tablet, Take 4 tablets (10 mg total) by mouth once a week. Caution:Chemotherapy. Protect from light. Start 5 mg weekly; increased by 2.5 mg every 2 weeks until a dose of 164mreached, Disp: 48 tablet, Rfl: 0 .  pantoprazole (PROTONIX) 40 MG tablet, Take 1 tablet (40 mg total) by mouth daily., Disp: 90 tablet, Rfl: 3 .  predniSONE (DELTASONE) 20 MG tablet, Take 2 tablets (40 mg total) by mouth daily with breakfast., Disp: 180 tablet, Rfl: 0 .  rosuvastatin (CRESTOR) 5 MG tablet, Take  1 tablet (5 mg total) by mouth daily at 6 PM., Disp: 90 tablet, Rfl: 3 .  sulfamethoxazole-trimethoprim (BACTRIM DS) 800-160 MG tablet, Take 1 tablet by mouth 3 (three) times a week., Disp: 36 tablet, Rfl: 1 .  triamterene-hydrochlorothiazide (MAXZIDE-25) 37.5-25 MG tablet, TAKE 1 TABLET BY MOUTH EVERY DAY, Disp: 90 tablet, Rfl: 2   Garner Nash, DO [facility] 06/25/2019 4:50 PM

## 2019-06-26 LAB — CBC WITH DIFFERENTIAL/PLATELET
Basophils Absolute: 0.1 10*3/uL (ref 0.0–0.1)
Basophils Relative: 0.6 % (ref 0.0–3.0)
Eosinophils Absolute: 0 10*3/uL (ref 0.0–0.7)
Eosinophils Relative: 0.1 % (ref 0.0–5.0)
HCT: 39.4 % (ref 36.0–46.0)
Hemoglobin: 13.4 g/dL (ref 12.0–15.0)
Lymphocytes Relative: 4 % — ABNORMAL LOW (ref 12.0–46.0)
Lymphs Abs: 0.3 10*3/uL — ABNORMAL LOW (ref 0.7–4.0)
MCHC: 34.1 g/dL (ref 30.0–36.0)
MCV: 98 fl (ref 78.0–100.0)
Monocytes Absolute: 0.3 10*3/uL (ref 0.1–1.0)
Monocytes Relative: 3.3 % (ref 3.0–12.0)
Neutro Abs: 7.4 10*3/uL (ref 1.4–7.7)
Neutrophils Relative %: 92 % — ABNORMAL HIGH (ref 43.0–77.0)
Platelets: 254 10*3/uL (ref 150.0–400.0)
RBC: 4.02 Mil/uL (ref 3.87–5.11)
RDW: 15 % (ref 11.5–15.5)
WBC: 8.1 10*3/uL (ref 4.0–10.5)

## 2019-06-26 LAB — COMPREHENSIVE METABOLIC PANEL
ALT: 28 U/L (ref 0–35)
AST: 16 U/L (ref 0–37)
Albumin: 4.2 g/dL (ref 3.5–5.2)
Alkaline Phosphatase: 41 U/L (ref 39–117)
BUN: 18 mg/dL (ref 6–23)
CO2: 30 mEq/L (ref 19–32)
Calcium: 9.3 mg/dL (ref 8.4–10.5)
Chloride: 101 mEq/L (ref 96–112)
Creatinine, Ser: 0.96 mg/dL (ref 0.40–1.20)
GFR: 58.66 mL/min — ABNORMAL LOW (ref >60.00)
Glucose, Bld: 109 mg/dL — ABNORMAL HIGH (ref 70–99)
Potassium: 4.3 mEq/L (ref 3.5–5.1)
Sodium: 139 mEq/L (ref 135–145)
Total Bilirubin: 1.1 mg/dL (ref 0.2–1.2)
Total Protein: 6.1 g/dL (ref 6.0–8.3)

## 2019-06-30 ENCOUNTER — Other Ambulatory Visit: Payer: Self-pay

## 2019-06-30 ENCOUNTER — Ambulatory Visit (INDEPENDENT_AMBULATORY_CARE_PROVIDER_SITE_OTHER)
Admission: RE | Admit: 2019-06-30 | Discharge: 2019-06-30 | Disposition: A | Discharge status: Home / Self Care | Payer: BC Managed Care – PPO | Source: Ambulatory Visit | Attending: Pulmonary Disease | Admitting: Pulmonary Disease

## 2019-06-30 DIAGNOSIS — D869 Sarcoidosis, unspecified: Secondary | ICD-10-CM

## 2019-06-30 DIAGNOSIS — R911 Solitary pulmonary nodule: Secondary | ICD-10-CM | POA: Diagnosis not present

## 2019-07-03 ENCOUNTER — Ambulatory Visit: Payer: BC Managed Care – PPO | Admitting: Internal Medicine

## 2019-07-04 ENCOUNTER — Encounter: Payer: Self-pay | Admitting: Internal Medicine

## 2019-07-04 ENCOUNTER — Ambulatory Visit (INDEPENDENT_AMBULATORY_CARE_PROVIDER_SITE_OTHER): Payer: BC Managed Care – PPO | Admitting: Internal Medicine

## 2019-07-04 ENCOUNTER — Other Ambulatory Visit: Payer: Self-pay

## 2019-07-04 VITALS — BP 140/90 | HR 83 | Temp 98.0°F | Ht 64.0 in | Wt 130.0 lb

## 2019-07-04 DIAGNOSIS — E559 Vitamin D deficiency, unspecified: Secondary | ICD-10-CM

## 2019-07-04 DIAGNOSIS — E7800 Pure hypercholesterolemia, unspecified: Secondary | ICD-10-CM

## 2019-07-04 DIAGNOSIS — I10 Essential (primary) hypertension: Secondary | ICD-10-CM

## 2019-07-04 DIAGNOSIS — D860 Sarcoidosis of lung: Secondary | ICD-10-CM

## 2019-07-04 DIAGNOSIS — Z8719 Personal history of other diseases of the digestive system: Secondary | ICD-10-CM

## 2019-07-04 MED ORDER — VITAMIN D (ERGOCALCIFEROL) 1.25 MG (50000 UNIT) PO CAPS: 50000.0000 [IU] | ORAL_CAPSULE | ORAL | 1 refills | Status: DC

## 2019-07-04 NOTE — Progress Notes (Signed)
   Subjective:    Patient ID: Tamara Myers, female    DOB: 01-10-1956, 64 y.o.   MRN: [id number]  HPI 64 year old Female diagnosed with sarcoidosis by Pulmonologist, Dr. Valeta Harms, in 2020.  Has been treated with prednisone and is now tapering off of that.  Is also on methotrexate from pulmonologist.  History of hyperlipidemia treated with low-dose statin medication.  History of essential hypertension.  History of vitamin D deficiency.  In February glucose was 109 on prednisone.  Had neutrophilia likely related to prednisone therapy.  However white blood cell count was 8100.  Review of Systems no new complaints     Objective:   Physical Exam  Blood pressure 140/90, pulse 83 temperature 98 degrees orally pulse oximetry 97% weight 130 pounds.  BMI 22.31.  Skin warm and dry.  No cervical adenopathy.  No thyromegaly.  No carotid bruits.  Chest clear to auscultation.  Cardiac exam regular rate and rhythm normal S1 and S2.  Extremities without edema.      Assessment & Plan:  Pulmonary sarcoidosis treated by pulmonologist and is tapering off of prednisone.  Is also on methotrexate.  Has albuterol inhaler if needed.  Essential hypertension-stable on current regimen of 3 drugs including Maxide 25, amlodipine and metoprolol  Hyperlipidemia treated with statin medication and stable  GE reflux treated with generic Protonix 40 mg daily and stable.  History of vitamin D deficiency treated with high-dose vitamin D.  This will be continued.  Level was 26 in August 2020.  She will take 50,000 units weekly.  Plan: She will continue close follow-up with pulmonologist.  Continue current medications for hypertension, hyperlipidemia and GE reflux.  She will take high-dose vitamin D weekly.  We will see her again in 6 months for health maintenance exam.

## 2019-07-13 NOTE — Progress Notes (Signed)
Cardiology Office Note:    Date:  07/15/2019   ID:  Tamara Myers, DOB 1955/11/18, MRN [id number]  PCP:  Tamara Showers, MD  Cardiologist:  No primary care provider on file.  Electrophysiologist:  None   Referring MD: Tamara Showers, MD   Chief Complaint  Patient presents with  . Chest Pain    History of Present Illness:    Tamara Myers is a 64 y.o. female with a hx of pulmonary sarcoid, asthma who is referred by Dr. Renold Myers for evaluation of CAD.  Recently underwent high-resolution CT chest for evaluation of pulmonary sarcoid.  Noted to have LAD calcifications.  She reports that she has occasional chest tightness.  Occurs 1-2 times per month.  Can occur at rest, but has noted chest tightness while she does yoga.  Typically will last for about 5 minutes and resolves.  She walks 3 miles about 2-3 times per week, denies any chest pain with walking.  She does have chronic dyspnea that she attributes to her pulmonary sarcoid.  On prednisone and methotrexate for sarcoid.  Diagnosed 1 year ago.  Reports dyspnea controlled on prednisone.  She does report that she had been having palpitations several years ago, but she stopped drinking caffeine and it significantly improved.  She now has rare episodes where she feels like heart is racing, lasts for couple minutes and resolves.  She occasionally has dizzy spells, but reports only occurs a few times per year.    Never smoked.  Father had CABG in 58s.  Son had WPW, treated with ablation.     Past Medical History:  Diagnosis Date  . Cancer ([place])    basal cell carcinoma  . Complication of anesthesia    slow to wake up  . Dyspnea    on exertion  . GERD (gastroesophageal reflux disease)   . Hypertension   . Melanoma ([place]) 2018   left upper arm  . Moderate persistent asthma without complication 123456  . PONV (postoperative nausea and vomiting)   . Post menopausal syndrome     Past Surgical History:  Procedure Laterality Date  .  CESAREAN SECTION    . OPEN REDUCTION INTERNAL FIXATION (ORIF) METACARPAL Right 11/24/2014   Procedure: OPEN REDUCTION INTERNAL FIXATION (ORIF) RIGHT THUMB;  Surgeon: Iran Planas, MD;  Location: [facility];  Service: Orthopedics;  Laterality: Right;  . thumb surgery Right 2014  . VIDEO BRONCHOSCOPY N/A 11/20/2018   Procedure: Video Bronchoscopy With Fluoro;  Surgeon: Garner Nash, DO;  Location: [facility] OR;  Service: Thoracic;  Laterality: N/A;  . VIDEO BRONCHOSCOPY WITH ENDOBRONCHIAL ULTRASOUND N/A 11/20/2018   Procedure: VIDEO BRONCHOSCOPY WITH ENDOBRONCHIAL ULTRASOUND;  Surgeon: Garner Nash, DO;  Location: [facility] OR;  Service: Thoracic;  Laterality: N/A;    Current Medications: Current Meds  Medication Sig  . albuterol (PROVENTIL HFA;VENTOLIN HFA) 108 (90 Base) MCG/ACT inhaler Inhale 2 puffs into the lungs every 6 (six) hours as needed for wheezing or shortness of breath.  Marland Kitchen amLODipine (NORVASC) 5 MG tablet TAKE 1 TABLET BY MOUTH EVERY DAY  . cholecalciferol (VITAMIN D3) 10 MCG (400 UNIT) TABS tablet Take 400 Units by mouth 2 (two) times daily.  . Fluticasone-Salmeterol (ADVAIR DISKUS) 100-50 MCG/DOSE AEPB Inhale 1 puff into the lungs 2 (two) times daily.  . folic acid (FOLVITE) 1 MG tablet Take 1 tablet (1 mg total) by mouth daily.  . methotrexate (RHEUMATREX) 2.5 MG tablet Take 4 tablets (10 mg total) by mouth once a  week. Caution:Chemotherapy. Protect from light. Start 5 mg weekly; increased by 2.5 mg every 2 weeks until a dose of 10mg  reached  . pantoprazole (PROTONIX) 40 MG tablet Take 1 tablet (40 mg total) by mouth daily.  . predniSONE (DELTASONE) 20 MG tablet Take 2 tablets (40 mg total) by mouth daily with breakfast. (Patient taking differently: Take 30 mg by mouth daily with breakfast. )  . rosuvastatin (CRESTOR) 5 MG tablet Take 1 tablet (5 mg total) by mouth daily at 6 PM.  . sulfamethoxazole-trimethoprim (BACTRIM DS) 800-160 MG tablet Take 1 tablet by mouth 3 (three) times a week.  .  triamterene-hydrochlorothiazide (MAXZIDE-25) 37.5-25 MG tablet TAKE 1 TABLET BY MOUTH EVERY DAY  . Vitamin D, Ergocalciferol, (DRISDOL) 1.25 MG (50000 UNIT) CAPS capsule Take 1 capsule (50,000 Units total) by mouth every 7 (seven) days.     Allergies:   Patient has no known allergies.   Social History   Socioeconomic History  . Marital status: Married    Spouse name: Not on file  . Number of children: Not on file  . Years of education: Not on file  . Highest education level: Not on file  Occupational History  . Not on file  Tobacco Use  . Smoking status: Never Smoker  . Smokeless tobacco: Never Used  Substance and Sexual Activity  . Alcohol use: No  . Drug use: No  . Sexual activity: Not on file  Other Topics Concern  . Not on file  Social History Narrative  . Not on file   Social Determinants of Health   Financial Resource Strain:   . Difficulty of Paying Living Expenses:   Food Insecurity:   . Worried About Charity fundraiser in the Last Year:   . Arboriculturist in the Last Year:   Transportation Needs:   . Film/video editor (Medical):   Marland Kitchen Lack of Transportation (Non-Medical):   Physical Activity:   . Days of Exercise per Week:   . Minutes of Exercise per Session:   Stress:   . Feeling of Stress :   Social Connections:   . Frequency of Communication with Friends and Family:   . Frequency of Social Gatherings with Friends and Family:   . Attends Religious Services:   . Active Member of Clubs or Organizations:   . Attends Archivist Meetings:   Marland Kitchen Marital Status:      Family History: The patient's family history includes Asthma in her daughter; Diabetes in her father; Heart disease in her father; Hyperlipidemia in her father; Hypertension in her father; Pulmonary fibrosis in her father. There is no history of Breast cancer, Allergic rhinitis, Eczema, Urticaria, or Angioedema.  ROS:   Please see the history of present illness.     All other  systems reviewed and are negative.  EKGs/Labs/Other Studies Reviewed:    The following studies were reviewed today:   EKG:  EKG is  ordered today.  The ekg ordered today demonstrates normal sinus rhythm, rate 85, no ST/T abnormalities  Recent Labs: 12/27/2018: TSH 1.05 06/25/2019: ALT 28; BUN 18; Creatinine, Ser 0.96; Hemoglobin 13.4; Platelets 254.0; Potassium 4.3; Sodium 139  Recent Lipid Panel    Component Value Date/Time   CHOL 208 (H) 12/27/2018 0930   TRIG 102 12/27/2018 0930   HDL 109 12/27/2018 0930   CHOLHDL 1.9 12/27/2018 0930   VLDL 15 12/19/2016 0923   LDLCALC 80 12/27/2018 0930    Physical Exam:    VS:  BP 120/76 (BP Location: Right Arm, Patient Position: Sitting, Cuff Size: Normal)   Pulse 85   Temp 98.4 F (36.9 C)   Ht 5\' 4"  (1.626 m)   Wt 129 lb (58.5 kg)   SpO2 98%   BMI 22.14 kg/m     Wt Readings from Last 3 Encounters:  07/15/19 129 lb (58.5 kg)  07/04/19 130 lb (59 kg)  06/25/19 126 lb 9.6 oz (57.4 kg)     GEN:  in no acute distress HEENT: Normal NECK: No JVD LYMPHATICS: No lymphadenopathy CARDIAC: RRR, no murmurs, rubs, gallops RESPIRATORY:  Clear to auscultation without rales, wheezing or rhonchi  ABDOMEN: Soft, non-tender, non-distended MUSCULOSKELETAL:  No edema; No deformity  SKIN: Warm and dry NEUROLOGIC:  Alert and oriented x 3 PSYCHIATRIC:  Normal affect   ASSESSMENT:    1. Coronary artery disease involving native coronary artery of native heart, angina presence unspecified   2. Chest pain, unspecified type   3. Pre-procedure lab exam   4. Sarcoidosis   5. Palpitations   6. Essential hypertension   7. Hyperlipidemia, unspecified hyperlipidemia type    PLAN:    Coronary artery disease: LAD calcifications noted on CT chest.  She does report atypical chest pain.  Will evaluate further with coronary CTA.  Continue Crestor  Pulmonary sarcoidosis: On prednisone and methotrexate.  Will check TTE to evaluate for evidence of cardiac  involvement  Palpitations: Rare episodes where he feels like heart is racing, will consider cardiac monitor if occurring more frequently  Hyperlipidemia: On rosuvastatin 5 mg daily.  LDL 88 on 12/19/2018  Hypertension: On amlodipine 5 mg daily and triamterne-HCTZ.  37.5-25 mg.  Appears controlled  RTC in 3 months  Medication Adjustments/Labs and Tests Ordered: Current medicines are reviewed at length with the patient today.  Concerns regarding medicines are outlined above.  Orders Placed This Encounter  Procedures  . CT CORONARY MORPH W/CTA COR W/SCORE W/CA W/CM &/OR WO/CM  . CT CORONARY FRACTIONAL FLOW RESERVE DATA PREP  . CT CORONARY FRACTIONAL FLOW RESERVE FLUID ANALYSIS  . Basic metabolic panel  . EKG 12-Lead  . ECHOCARDIOGRAM COMPLETE   Meds ordered this encounter  Medications  . metoprolol tartrate (LOPRESSOR) 100 MG tablet    Sig: Take 100 mg (1 tablet) TWO hours prior to CT    Dispense:  1 tablet    Refill:  0    Patient Instructions  Medication Instructions:  Your physician recommends that you continue on your current medications as directed. Please refer to the Current Medication list given to you today.  Take metoprolol tartrate (Lopressor) 100 mg TWO HOURS PRIOR TO CT  *If you need a refill on your cardiac medications before your next appointment, please call your pharmacy*   Lab Work: BMET today  If you have labs (blood work) drawn today and your tests are completely normal, you will receive your results only by: Marland Kitchen MyChart Message (if you have MyChart) OR . A paper copy in the mail If you have any lab test that is abnormal or we need to change your treatment, we will call you to review the results.   Testing/Procedures: Your physician has requested that you have an echocardiogram. Echocardiography is a painless test that uses sound waves to create images of your heart. It provides your doctor with information about the size and shape of your heart and how  well your heart's chambers and valves are working. This procedure takes approximately one hour. There are no restrictions  for this procedure. This will be done at our [facility] location:  [place] has requested that you have cardiac CT. Cardiac computed tomography (CT) is a painless test that uses an x-ray machine to take clear, detailed pictures of your heart. For further information please visit [email or web address].[email or web address]. Please follow instruction sheet as given.   Follow-Up: At [facility], you and your health needs are our priority.  As part of our continuing mission to provide you with exceptional heart care, we have created designated Provider Care Teams.  These Care Teams include your primary Cardiologist (physician) and Advanced Practice Providers (APPs -  Physician Assistants and Nurse Practitioners) who all work together to provide you with the care you need, when you need it.  We recommend signing up for the patient portal called "MyChart".  Sign up information is provided on this After Visit Summary.  MyChart is used to connect with patients for Virtual Visits (Telemedicine).  Patients are able to view lab/test results, encounter notes, upcoming appointments, etc.  Non-urgent messages can be sent to your provider as well.   To learn more about what you can do with MyChart, go to [email or web address].    Your next appointment:   3 month(s)  The format for your next appointment:   In Person  Provider:   Oswaldo Milian, MD        Signed, Donato Heinz, MD  07/15/2019 12:57 PM    [place]

## 2019-07-15 ENCOUNTER — Other Ambulatory Visit: Payer: Self-pay

## 2019-07-15 ENCOUNTER — Ambulatory Visit: Payer: BC Managed Care – PPO | Admitting: Cardiology

## 2019-07-15 ENCOUNTER — Encounter: Payer: Self-pay | Admitting: Cardiology

## 2019-07-15 VITALS — BP 120/76 | HR 85 | Temp 98.4°F | Ht 64.0 in | Wt 129.0 lb

## 2019-07-15 DIAGNOSIS — R079 Chest pain, unspecified: Secondary | ICD-10-CM

## 2019-07-15 DIAGNOSIS — Z01812 Encounter for preprocedural laboratory examination: Secondary | ICD-10-CM

## 2019-07-15 DIAGNOSIS — I2510 Atherosclerotic heart disease of native coronary artery without angina pectoris: Secondary | ICD-10-CM

## 2019-07-15 DIAGNOSIS — I10 Essential (primary) hypertension: Secondary | ICD-10-CM

## 2019-07-15 DIAGNOSIS — D869 Sarcoidosis, unspecified: Secondary | ICD-10-CM

## 2019-07-15 DIAGNOSIS — R002 Palpitations: Secondary | ICD-10-CM

## 2019-07-15 DIAGNOSIS — E785 Hyperlipidemia, unspecified: Secondary | ICD-10-CM

## 2019-07-15 LAB — BASIC METABOLIC PANEL
BUN/Creatinine Ratio: 12 (ref 12–28)
BUN: 13 mg/dL (ref 8–27)
CO2: 26 mmol/L (ref 20–29)
Calcium: 9.9 mg/dL (ref 8.7–10.3)
Chloride: 99 mmol/L (ref 96–106)
Creatinine, Ser: 1.07 mg/dL — ABNORMAL HIGH (ref 0.57–1.00)
GFR calc Af Amer: 64 mL/min/{1.73_m2} (ref >59)
GFR calc non Af Amer: 55 mL/min/{1.73_m2} — ABNORMAL LOW (ref >59)
Glucose: 100 mg/dL — ABNORMAL HIGH (ref 65–99)
Potassium: 3.7 mmol/L (ref 3.5–5.2)
Sodium: 142 mmol/L (ref 134–144)

## 2019-07-15 MED ORDER — METOPROLOL TARTRATE 100 MG PO TABS: ORAL_TABLET | ORAL | 0 refills | Status: DC

## 2019-07-15 NOTE — Patient Instructions (Signed)
Medication Instructions:  Your physician recommends that you continue on your current medications as directed. Please refer to the Current Medication list given to you today.  Take metoprolol tartrate (Lopressor) 100 mg TWO HOURS PRIOR TO CT  *If you need a refill on your cardiac medications before your next appointment, please call your pharmacy*   Lab Work: BMET today  If you have labs (blood work) drawn today and your tests are completely normal, you will receive your results only by: Marland Kitchen MyChart Message (if you have MyChart) OR . A paper copy in the mail If you have any lab test that is abnormal or we need to change your treatment, we will call you to review the results.   Testing/Procedures: Your physician has requested that you have an echocardiogram. Echocardiography is a painless test that uses sound waves to create images of your heart. It provides your doctor with information about the size and shape of your heart and how well your heart's chambers and valves are working. This procedure takes approximately one hour. There are no restrictions for this procedure. This will be done at our [facility] location:  [place] has requested that you have cardiac CT. Cardiac computed tomography (CT) is a painless test that uses an x-ray machine to take clear, detailed pictures of your heart. For further information please visit [email or web address].[email or web address]. Please follow instruction sheet as given.   Follow-Up: At [facility], you and your health needs are our priority.  As part of our continuing mission to provide you with exceptional heart care, we have created designated Provider Care Teams.  These Care Teams include your primary Cardiologist (physician) and Advanced Practice Providers (APPs -  Physician Assistants and Nurse Practitioners) who all work together to provide you with the care you need, when you need it.  We recommend signing up for the  patient portal called "MyChart".  Sign up information is provided on this After Visit Summary.  MyChart is used to connect with patients for Virtual Visits (Telemedicine).  Patients are able to view lab/test results, encounter notes, upcoming appointments, etc.  Non-urgent messages can be sent to your provider as well.   To learn more about what you can do with MyChart, go to [email or web address].    Your next appointment:   3 month(s)  The format for your next appointment:   In Person  Provider:   Oswaldo Milian, MD

## 2019-07-27 NOTE — Patient Instructions (Addendum)
It was a pleasure to see you today.  Continue high-dose vitamin D supplement weekly.  Continue current medications for hypertension hyperlipidemia and GE reflux.  Continue prednisone taper prescribed for sarcoidosis by Pulmonary.  See here in 6 months for health maintenance exam, fasting labs and evaluation of medical issues.

## 2019-07-29 ENCOUNTER — Other Ambulatory Visit: Payer: Self-pay

## 2019-07-29 ENCOUNTER — Ambulatory Visit (HOSPITAL_COMMUNITY): Payer: BC Managed Care – PPO | Attending: Cardiology

## 2019-07-29 DIAGNOSIS — R079 Chest pain, unspecified: Secondary | ICD-10-CM | POA: Diagnosis not present

## 2019-08-06 ENCOUNTER — Telehealth: Payer: Self-pay | Admitting: Pulmonary Disease

## 2019-08-06 NOTE — Telephone Encounter (Signed)
ATC pt, line was answered then went dead. Tried to call back but received a busy signal. Will try back.

## 2019-08-08 NOTE — Telephone Encounter (Signed)
PCCM:  Increase prednisone back to 30mg  daily and stay there. Have her let us know about her symptoms.   [facility] 08/08/2019 11:44 AM

## 2019-08-08 NOTE — Telephone Encounter (Signed)
Called and spoke with pt letting her know the info stated by Dr. Icard and she verbalized understanding. Nothing further needed. 

## 2019-08-08 NOTE — Telephone Encounter (Signed)
Called and spoke with pt who stated 1 week ago from this past Sunday she dropped down to 20mg  prednisone. Pt stated she started coughing more last Thursday 4/1.  Pt stated that she can tell she is having a little harder time trying to catch her breath due to decreasing the prednisone.   Sending to Dr. Valeta Harms for him to review.

## 2019-08-13 DIAGNOSIS — L821 Other seborrheic keratosis: Secondary | ICD-10-CM | POA: Diagnosis not present

## 2019-08-13 DIAGNOSIS — Z85820 Personal history of malignant melanoma of skin: Secondary | ICD-10-CM | POA: Diagnosis not present

## 2019-08-13 DIAGNOSIS — Z85828 Personal history of other malignant neoplasm of skin: Secondary | ICD-10-CM | POA: Diagnosis not present

## 2019-08-13 DIAGNOSIS — D2262 Melanocytic nevi of left upper limb, including shoulder: Secondary | ICD-10-CM | POA: Diagnosis not present

## 2019-08-13 DIAGNOSIS — L570 Actinic keratosis: Secondary | ICD-10-CM | POA: Diagnosis not present

## 2019-08-13 DIAGNOSIS — D0461 Carcinoma in situ of skin of right upper limb, including shoulder: Secondary | ICD-10-CM | POA: Diagnosis not present

## 2019-08-13 DIAGNOSIS — D2261 Melanocytic nevi of right upper limb, including shoulder: Secondary | ICD-10-CM | POA: Diagnosis not present

## 2019-08-13 DIAGNOSIS — D2272 Melanocytic nevi of left lower limb, including hip: Secondary | ICD-10-CM | POA: Diagnosis not present

## 2019-08-18 ENCOUNTER — Telehealth (HOSPITAL_COMMUNITY): Payer: Self-pay | Admitting: Emergency Medicine

## 2019-08-18 ENCOUNTER — Encounter (HOSPITAL_COMMUNITY): Payer: Self-pay

## 2019-08-18 NOTE — Telephone Encounter (Signed)
Pt returning phone call regarding upcoming cardiac imaging study; pt verbalizes understanding of appt date/time, parking situation and where to check in, pre-test NPO status and medications ordered, and verified current allergies; name and call back number provided for further questions should they arise Alvilda Mckenna RN Navigator Cardiac Imaging [facility] [phone] office [phone] cell   

## 2019-08-18 NOTE — Telephone Encounter (Signed)
Attempted to call patient regarding upcoming cardiac CT appointment. °Left message on voicemail with name and callback number °Zaven Klemens RN Navigator Cardiac Imaging °[facility] °[phone] Office °[phone] Cell ° °

## 2019-08-19 ENCOUNTER — Other Ambulatory Visit: Payer: Self-pay

## 2019-08-19 ENCOUNTER — Encounter (HOSPITAL_COMMUNITY): Payer: Self-pay

## 2019-08-19 ENCOUNTER — Ambulatory Visit (HOSPITAL_COMMUNITY)
Admission: RE | Admit: 2019-08-19 | Discharge: 2019-08-19 | Disposition: A | Discharge status: Home / Self Care | Payer: BC Managed Care – PPO | Source: Ambulatory Visit | Attending: Cardiology | Admitting: Cardiology

## 2019-08-19 DIAGNOSIS — R079 Chest pain, unspecified: Secondary | ICD-10-CM | POA: Diagnosis not present

## 2019-08-19 MED ORDER — NITROGLYCERIN 0.4 MG SL SUBL
0.8000 mg | SUBLINGUAL_TABLET | Freq: Once | SUBLINGUAL | Status: AC
  Administered 2019-08-19: 0.8 mg via SUBLINGUAL

## 2019-08-19 MED ORDER — NITROGLYCERIN 0.4 MG SL SUBL
SUBLINGUAL_TABLET | SUBLINGUAL | Status: AC
  Filled 2019-08-19: qty 2

## 2019-08-19 MED ORDER — IOHEXOL 350 MG/ML SOLN
80.0000 mL | Freq: Once | INTRAVENOUS | Status: AC | PRN
  Administered 2019-08-19: 80 mL via INTRAVENOUS

## 2019-08-21 ENCOUNTER — Other Ambulatory Visit: Payer: Self-pay | Admitting: *Deleted

## 2019-08-21 DIAGNOSIS — D869 Sarcoidosis, unspecified: Secondary | ICD-10-CM

## 2019-08-21 MED ORDER — PREDNISONE 20 MG PO TABS: 30.0000 mg | ORAL_TABLET | Freq: Every day | ORAL | 3 refills | Status: AC

## 2019-08-22 ENCOUNTER — Other Ambulatory Visit: Payer: Self-pay | Admitting: *Deleted

## 2019-08-22 MED ORDER — ROSUVASTATIN CALCIUM 10 MG PO TABS: 10.0000 mg | ORAL_TABLET | Freq: Every day | ORAL | 3 refills | Status: DC

## 2019-09-05 DIAGNOSIS — L570 Actinic keratosis: Secondary | ICD-10-CM | POA: Diagnosis not present

## 2019-09-05 DIAGNOSIS — D0461 Carcinoma in situ of skin of right upper limb, including shoulder: Secondary | ICD-10-CM | POA: Diagnosis not present

## 2019-09-16 ENCOUNTER — Other Ambulatory Visit: Payer: Self-pay | Admitting: *Deleted

## 2019-09-16 DIAGNOSIS — D869 Sarcoidosis, unspecified: Secondary | ICD-10-CM

## 2019-09-16 MED ORDER — METHOTREXATE 2.5 MG PO TABS: 10.0000 mg | ORAL_TABLET | ORAL | 1 refills | Status: DC

## 2019-10-14 ENCOUNTER — Other Ambulatory Visit: Payer: Self-pay | Admitting: *Deleted

## 2019-10-14 DIAGNOSIS — D869 Sarcoidosis, unspecified: Secondary | ICD-10-CM

## 2019-10-14 MED ORDER — METHOTREXATE 2.5 MG PO TABS: 10.0000 mg | ORAL_TABLET | ORAL | 1 refills | Status: AC

## 2019-10-22 ENCOUNTER — Ambulatory Visit: Payer: BC Managed Care – PPO | Admitting: Cardiology

## 2019-10-22 ENCOUNTER — Other Ambulatory Visit: Payer: Self-pay

## 2019-10-22 ENCOUNTER — Encounter: Payer: Self-pay | Admitting: Cardiology

## 2019-10-22 VITALS — BP 134/68 | HR 79 | Ht 64.0 in | Wt 139.0 lb

## 2019-10-22 DIAGNOSIS — R002 Palpitations: Secondary | ICD-10-CM | POA: Diagnosis not present

## 2019-10-22 DIAGNOSIS — I2510 Atherosclerotic heart disease of native coronary artery without angina pectoris: Secondary | ICD-10-CM | POA: Diagnosis not present

## 2019-10-22 DIAGNOSIS — D869 Sarcoidosis, unspecified: Secondary | ICD-10-CM | POA: Diagnosis not present

## 2019-10-22 DIAGNOSIS — E785 Hyperlipidemia, unspecified: Secondary | ICD-10-CM

## 2019-10-22 DIAGNOSIS — I10 Essential (primary) hypertension: Secondary | ICD-10-CM | POA: Diagnosis not present

## 2019-10-22 NOTE — Patient Instructions (Signed)
Medication Instructions:  Your physician recommends that you continue on your current medications as directed. Please refer to the Current Medication list given to you today.  *If you need a refill on your cardiac medications before your next appointment, please call your pharmacy*  Testing/Procedures: Your physician has requested that you have a cardiac MRI. Cardiac MRI uses a computer to create images of your heart as its beating, producing both still and moving pictures of your heart and major blood vessels. For further information please visit [email or web address] Please follow the instruction sheet given to you today for more information. --this must be approved by insurance prior to scheduling   Follow-Up: At [facility], you and your health needs are our priority.  As part of our continuing mission to provide you with exceptional heart care, we have created designated Provider Care Teams.  These Care Teams include your primary Cardiologist (physician) and Advanced Practice Providers (APPs -  Physician Assistants and Nurse Practitioners) who all work together to provide you with the care you need, when you need it.  We recommend signing up for the patient portal called "MyChart".  Sign up information is provided on this After Visit Summary.  MyChart is used to connect with patients for Virtual Visits (Telemedicine).  Patients are able to view lab/test results, encounter notes, upcoming appointments, etc.  Non-urgent messages can be sent to your provider as well.   To learn more about what you can do with MyChart, go to [email or web address].    Your next appointment:   3 month(s)  The format for your next appointment:   In Person  Provider:   Oswaldo Milian, MD

## 2019-10-22 NOTE — Progress Notes (Signed)
Cardiology Office Note:    Date:  10/22/2019   ID:  Tamara Myers, DOB 1955/05/25, MRN [id number]  PCP:  Elby Showers, MD  Cardiologist:  No primary care provider on file.  Electrophysiologist:  None   Referring MD: Elby Showers, MD   No chief complaint on file.   History of Present Illness:    Tamara Myers is a 64 y.o. female with a hx of pulmonary sarcoid, asthma who presents for follow-up.  She was referred by Dr. Renold Genta for evaluation of CAD, initially seen on 07/15/2019.  Recently underwent high-resolution CT chest for evaluation of pulmonary sarcoid.  Noted to have LAD calcifications.  She reports that she has occasional chest tightness.  Occurs 1-2 times per month.  Can occur at rest, but has noted chest tightness while she does yoga.  Typically will last for about 5 minutes and resolves.  She walks 3 miles about 2-3 times per week, denies any chest pain with walking.  She does have chronic dyspnea that she attributes to her pulmonary sarcoid.  On prednisone and methotrexate for sarcoid.  Diagnosed 1 year ago.  Reports dyspnea controlled on prednisone.  She does report that she had been having palpitations several years ago, but she stopped drinking caffeine and it significantly improved.  She now has rare episodes where she feels like heart is racing, lasts for couple minutes and resolves.  She occasionally has dizzy spells, but reports only occurs a few times per year.  Never smoked.  Father had CABG in 4s.  Son had WPW, treated with ablation.     Echocardiogram on 07/29/2019 showed LVEF 55 to 60% with focal basal inferior hypokinesis, normal RV function, no significant valvular disease.  Coronary CTA on 08/19/2019 showed nonobstructive CAD in the mid LAD (calcium score 61, 80th percentile).  .  Since last clinic visit, she reports that she has been doing well.  She denies any chest pain.  Reports chronic dyspnea, but has been stable.  Denies any lower extremity edema,  lightheadedness, palpitations, or syncope.  She has been taking rosuvastatin 10 mg daily, denies any issues.   Past Medical History:  Diagnosis Date  . Cancer ([place])    basal cell carcinoma  . Complication of anesthesia    slow to wake up  . Dyspnea    on exertion  . GERD (gastroesophageal reflux disease)   . Hypertension   . Melanoma ([place]) 2018   left upper arm  . Moderate persistent asthma without complication 78/06/4233  . PONV (postoperative nausea and vomiting)   . Post menopausal syndrome     Past Surgical History:  Procedure Laterality Date  . CESAREAN SECTION    . OPEN REDUCTION INTERNAL FIXATION (ORIF) METACARPAL Right 11/24/2014   Procedure: OPEN REDUCTION INTERNAL FIXATION (ORIF) RIGHT THUMB;  Surgeon: Iran Planas, MD;  Location: [facility];  Service: Orthopedics;  Laterality: Right;  . thumb surgery Right 2014  . VIDEO BRONCHOSCOPY N/A 11/20/2018   Procedure: Video Bronchoscopy With Fluoro;  Surgeon: Garner Nash, DO;  Location: [facility];  Service: Thoracic;  Laterality: N/A;  . VIDEO BRONCHOSCOPY WITH ENDOBRONCHIAL ULTRASOUND N/A 11/20/2018   Procedure: VIDEO BRONCHOSCOPY WITH ENDOBRONCHIAL ULTRASOUND;  Surgeon: Garner Nash, DO;  Location: [facility] OR;  Service: Thoracic;  Laterality: N/A;    Current Medications: Current Meds  Medication Sig  . albuterol (PROVENTIL HFA;VENTOLIN HFA) 108 (90 Base) MCG/ACT inhaler Inhale 2 puffs into the lungs every 6 (six) hours as needed for  wheezing or shortness of breath.  Marland Kitchen amLODipine (NORVASC) 5 MG tablet TAKE 1 TABLET BY MOUTH EVERY DAY  . cholecalciferol (VITAMIN D3) 10 MCG (400 UNIT) TABS tablet Take 400 Units by mouth 2 (two) times daily.  . Fluticasone-Salmeterol (ADVAIR DISKUS) 100-50 MCG/DOSE AEPB Inhale 1 puff into the lungs 2 (two) times daily.  . methotrexate (RHEUMATREX) 2.5 MG tablet Take 4 tablets (10 mg total) by mouth once a week. Caution:Chemotherapy. Protect from light. Start 5 mg weekly; increased by 2.5 mg every 2  weeks until a dose of 10mg  reached  . metoprolol tartrate (LOPRESSOR) 100 MG tablet Take 100 mg (1 tablet) TWO hours prior to CT  . pantoprazole (PROTONIX) 40 MG tablet Take 1 tablet (40 mg total) by mouth daily.  . predniSONE (DELTASONE) 20 MG tablet Take 1.5 tablets (30 mg total) by mouth daily with breakfast.  . rosuvastatin (CRESTOR) 10 MG tablet Take 1 tablet (10 mg total) by mouth daily at 6 PM.  . triamterene-hydrochlorothiazide (MAXZIDE-25) 37.5-25 MG tablet TAKE 1 TABLET BY MOUTH EVERY DAY  . Vitamin D, Ergocalciferol, (DRISDOL) 1.25 MG (50000 UNIT) CAPS capsule Take 1 capsule (50,000 Units total) by mouth every 7 (seven) days.     Allergies:   Patient has no known allergies.   Social History   Socioeconomic History  . Marital status: Married    Spouse name: Not on file  . Number of children: Not on file  . Years of education: Not on file  . Highest education level: Not on file  Occupational History  . Not on file  Tobacco Use  . Smoking status: Never Smoker  . Smokeless tobacco: Never Used  Vaping Use  . Vaping Use: Never used  Substance and Sexual Activity  . Alcohol use: No  . Drug use: No  . Sexual activity: Not on file  Other Topics Concern  . Not on file  Social History Narrative  . Not on file   Social Determinants of Health   Financial Resource Strain:   . Difficulty of Paying Living Expenses:   Food Insecurity:   . Worried About Charity fundraiser in the Last Year:   . Arboriculturist in the Last Year:   Transportation Needs:   . Film/video editor (Medical):   Marland Kitchen Lack of Transportation (Non-Medical):   Physical Activity:   . Days of Exercise per Week:   . Minutes of Exercise per Session:   Stress:   . Feeling of Stress :   Social Connections:   . Frequency of Communication with Friends and Family:   . Frequency of Social Gatherings with Friends and Family:   . Attends Religious Services:   . Active Member of Clubs or Organizations:   .  Attends Archivist Meetings:   Marland Kitchen Marital Status:      Family History: The patient's family history includes Asthma in her daughter; Diabetes in her father; Heart disease in her father; Hyperlipidemia in her father; Hypertension in her father; Pulmonary fibrosis in her father. There is no history of Breast cancer, Allergic rhinitis, Eczema, Urticaria, or Angioedema.  ROS:   Please see the history of present illness.     All other systems reviewed and are negative.  EKGs/Labs/Other Studies Reviewed:    The following studies were reviewed today:   EKG:  EKG is not ordered today.  The ekg ordered most recently demonstrates normal sinus rhythm, rate 85, no ST/T abnormalities  Recent Labs: 12/27/2018: TSH 1.05  06/25/2019: ALT 28; Hemoglobin 13.4; Platelets 254.0 07/15/2019: BUN 13; Creatinine, Ser 1.07; Potassium 3.7; Sodium 142  Recent Lipid Panel    Component Value Date/Time   CHOL 208 (H) 12/27/2018 0930   TRIG 102 12/27/2018 0930   HDL 109 12/27/2018 0930   CHOLHDL 1.9 12/27/2018 0930   VLDL 15 12/19/2016 0923   LDLCALC 80 12/27/2018 0930    Physical Exam:    VS:  BP 134/68   Pulse 79   Ht 5\' 4"  (1.626 m)   Wt 139 lb (63 kg)   SpO2 97%   BMI 23.86 kg/m     Wt Readings from Last 3 Encounters:  10/22/19 139 lb (63 kg)  07/15/19 129 lb (58.5 kg)  07/04/19 130 lb (59 kg)     GEN:  in no acute distress HEENT: Normal NECK: No JVD LYMPHATICS: No lymphadenopathy CARDIAC: RRR, no murmurs, rubs, gallops RESPIRATORY:  Clear to auscultation without rales, wheezing or rhonchi  ABDOMEN: Soft, non-tender, non-distended MUSCULOSKELETAL:  No edema; No deformity  SKIN: Warm and dry NEUROLOGIC:  Alert and oriented x 3 PSYCHIATRIC:  Normal affect   ASSESSMENT:    1. Coronary artery disease involving native coronary artery of native heart without angina pectoris   2. Sarcoidosis   3. Palpitations   4. Essential hypertension   5. Hyperlipidemia, unspecified  hyperlipidemia type    PLAN:    Coronary artery disease: Coronary CTA on 08/19/2019 showed nonobstructive CAD in the mid LAD (calcium score 61, 80th percentile).  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  Given focal basal inferior hypokinesis on echocardiogram, cardiac MRI ordered to evaluate for cardiac sarcoid  Palpitations: Rare episodes where she feels like heart is racing, will consider cardiac monitor if occurring more frequently.  None recently.  Hyperlipidemia:  LDL 88 on 12/19/2018 on rosuvastatin 5 mg daily.  Increased to 10 mg daily as above for goal LDL less than 70  Hypertension: On amlodipine 5 mg daily and triamterne-HCTZ.  37.5-25 mg.  Appears controlled  RTC in 3 months  Medication Adjustments/Labs and Tests Ordered: Current medicines are reviewed at length with the patient today.  Concerns regarding medicines are outlined above.  Orders Placed This Encounter  Procedures  . MR CARDIAC MORPHOLOGY W WO CONTRAST   No orders of the defined types were placed in this encounter.   Patient Instructions  Medication Instructions:  Your physician recommends that you continue on your current medications as directed. Please refer to the Current Medication list given to you today.  *If you need a refill on your cardiac medications before your next appointment, please call your pharmacy*  Testing/Procedures: Your physician has requested that you have a cardiac MRI. Cardiac MRI uses a computer to create images of your heart as its beating, producing both still and moving pictures of your heart and major blood vessels. For further information please visit [email or web address] Please follow the instruction sheet given to you today for more information. --this must be approved by insurance prior to scheduling   Follow-Up: At [facility], you and your health needs are our priority.  As part of our continuing mission to provide you with  exceptional heart care, we have created designated Provider Care Teams.  These Care Teams include your primary Cardiologist (physician) and Advanced Practice Providers (APPs -  Physician Assistants and Nurse Practitioners) who all work together to provide you with the care you need, when you need it.  We recommend signing up  for the patient portal called "MyChart".  Sign up information is provided on this After Visit Summary.  MyChart is used to connect with patients for Virtual Visits (Telemedicine).  Patients are able to view lab/test results, encounter notes, upcoming appointments, etc.  Non-urgent messages can be sent to your provider as well.   To learn more about what you can do with MyChart, go to [email or web address].    Your next appointment:   3 month(s)  The format for your next appointment:   In Person  Provider:   Oswaldo Milian, MD     Signed, Donato Heinz, MD  10/22/2019 8:25 AM    [place]

## 2019-10-23 ENCOUNTER — Ambulatory Visit: Payer: BC Managed Care – PPO | Admitting: Pulmonary Disease

## 2019-10-23 ENCOUNTER — Encounter: Payer: Self-pay | Admitting: Pulmonary Disease

## 2019-10-23 ENCOUNTER — Ambulatory Visit (INDEPENDENT_AMBULATORY_CARE_PROVIDER_SITE_OTHER): Payer: BC Managed Care – PPO

## 2019-10-23 VITALS — BP 122/76 | HR 86 | Temp 98.7°F | Ht 64.0 in | Wt 137.6 lb

## 2019-10-23 DIAGNOSIS — D869 Sarcoidosis, unspecified: Secondary | ICD-10-CM

## 2019-10-23 DIAGNOSIS — R05 Cough: Secondary | ICD-10-CM

## 2019-10-23 DIAGNOSIS — R059 Cough, unspecified: Secondary | ICD-10-CM

## 2019-10-23 LAB — CBC WITH DIFFERENTIAL/PLATELET
Basophils Absolute: 0 10*3/uL (ref 0.0–0.1)
Basophils Relative: 0.1 % (ref 0.0–3.0)
Eosinophils Absolute: 0 10*3/uL (ref 0.0–0.7)
Eosinophils Relative: 0 % (ref 0.0–5.0)
HCT: 40.5 % (ref 36.0–46.0)
Hemoglobin: 14.1 g/dL (ref 12.0–15.0)
Lymphocytes Relative: 5 % — ABNORMAL LOW (ref 12.0–46.0)
Lymphs Abs: 0.3 10*3/uL — ABNORMAL LOW (ref 0.7–4.0)
MCHC: 34.8 g/dL (ref 30.0–36.0)
MCV: 98.4 fl (ref 78.0–100.0)
Monocytes Absolute: 0.3 10*3/uL (ref 0.1–1.0)
Monocytes Relative: 4 % (ref 3.0–12.0)
Neutro Abs: 6 10*3/uL (ref 1.4–7.7)
Neutrophils Relative %: 90.9 % — ABNORMAL HIGH (ref 43.0–77.0)
Platelets: 264 10*3/uL (ref 150.0–400.0)
RBC: 4.11 Mil/uL (ref 3.87–5.11)
RDW: 13.8 % (ref 11.5–15.5)
WBC: 6.6 10*3/uL (ref 4.0–10.5)

## 2019-10-23 LAB — HEPATIC FUNCTION PANEL
ALT: 22 U/L (ref 0–35)
AST: 17 U/L (ref 0–37)
Albumin: 4.6 g/dL (ref 3.5–5.2)
Alkaline Phosphatase: 41 U/L (ref 39–117)
Bilirubin, Direct: 0.1 mg/dL (ref 0.0–0.3)
Total Bilirubin: 0.7 mg/dL (ref 0.2–1.2)
Total Protein: 6.3 g/dL (ref 6.0–8.3)

## 2019-10-23 LAB — COMPREHENSIVE METABOLIC PANEL
ALT: 22 U/L (ref 0–35)
AST: 17 U/L (ref 0–37)
Albumin: 4.6 g/dL (ref 3.5–5.2)
Alkaline Phosphatase: 41 U/L (ref 39–117)
BUN: 11 mg/dL (ref 6–23)
CO2: 30 mEq/L (ref 19–32)
Calcium: 9.4 mg/dL (ref 8.4–10.5)
Chloride: 96 mEq/L (ref 96–112)
Creatinine, Ser: 0.89 mg/dL (ref 0.40–1.20)
GFR: 63.95 mL/min (ref >60.00)
Glucose, Bld: 109 mg/dL — ABNORMAL HIGH (ref 70–99)
Potassium: 4 mEq/L (ref 3.5–5.1)
Sodium: 136 mEq/L (ref 135–145)
Total Bilirubin: 0.7 mg/dL (ref 0.2–1.2)
Total Protein: 6.3 g/dL (ref 6.0–8.3)

## 2019-10-23 NOTE — Assessment & Plan Note (Signed)
Plan: Continue Advair 100 Chest x-ray today Lab work today Continue methotrexate Continue prednisone

## 2019-10-23 NOTE — Patient Instructions (Addendum)
You were seen today by Lauraine Rinne, NP  for:   1. Sarcoidosis  - CBC with Differential/Platelet; Future - Comp Met (CMET); Future - Hepatic function panel; Future - DG Chest 2 View; Future  Walk today in office  Continue methotrexate 10 mg weekly Continue folic acid 1 mg Continue Monday Wednesday Friday Bactrim Continue prednisone 20 mg daily  Proceed forward with cardiac MRI as ordered by cardiology  2. Cough  - DG Chest 2 View; Future  3. Abnormal CT scan of lung    We recommend today:  Orders Placed This Encounter  Procedures  . DG Chest 2 View    Standing Status:   Future    Standing Expiration Date:   02/22/2020    Order Specific Question:   Reason for Exam (SYMPTOM  OR DIAGNOSIS REQUIRED)    Answer:   sarcoid / cough    Order Specific Question:   Preferred imaging location?    Answer:   Internal    Order Specific Question:   Radiology Contrast Protocol - do NOT remove file path    Answer:   [email or web address]  . CBC with Differential/Platelet    Standing Status:   Future    Standing Expiration Date:   10/22/2020  . Comp Met (CMET)    Standing Status:   Future    Standing Expiration Date:   10/22/2020  . Hepatic function panel    Standing Status:   Future    Standing Expiration Date:   10/22/2020   Orders Placed This Encounter  Procedures  . DG Chest 2 View  . CBC with Differential/Platelet  . Comp Met (CMET)  . Hepatic function panel   No orders of the defined types were placed in this encounter.   Follow Up:    Return in about 6 weeks (around 12/04/2019), or if symptoms worsen or fail to improve, for Follow up with Dr. Valeta Harms.   Please do your part to reduce the spread of COVID-19:      Reduce your risk of any infection  and COVID19 by using the similar precautions used for avoiding the common cold or flu:  Marland Kitchen Wash your hands often with soap and warm water for at least 20 seconds.  If soap and water are not  readily available, use an alcohol-based hand sanitizer with at least 60% alcohol.  . If coughing or sneezing, cover your mouth and nose by coughing or sneezing into the elbow areas of your shirt or coat, into a tissue or into your sleeve (not your hands). Langley Gauss A MASK when in public  . Avoid shaking hands with others and consider head nods or verbal greetings only. . Avoid touching your eyes, nose, or mouth with unwashed hands.  . Avoid close contact with people who are sick. . Avoid places or events with large numbers of people in one location, like concerts or sporting events. . If you have some symptoms but not all symptoms, continue to monitor at home and seek medical attention if your symptoms worsen. . If you are having a medical emergency, call 911.   [place] / e-Visit: [email or web address]         [facility]: [phone]  [facility]: [phone]                   [facility]: [phone]     It is flu season:   >>> Best  ways to protect herself from the flu: Receive the yearly flu vaccine, practice good hand hygiene washing with soap and also using hand sanitizer when available, eat a nutritious meals, get adequate rest, hydrate appropriately   Please contact the office if your symptoms worsen or you have concerns that you are not improving.   Thank you for choosing [facility] for your healthcare, and for allowing [place] to partner with you on your healthcare journey. I am thankful to be able to provide care to you today.   Wyn Quaker FNP-C

## 2019-10-23 NOTE — Progress Notes (Signed)
PCCM:  Thanks for seeing her. I agree we may need to consider other options pending cMRI and labs.  Can discuss with PM or MR to see if they have any thoughts  Thanks  [place], DO [facility] 10/23/2019 4:22 PM

## 2019-10-23 NOTE — Assessment & Plan Note (Addendum)
Maintained on methotrexate Recent abnormal echo Cardiac MRI ordered by cardiology Walk today in office stable without any oxygen desaturations Worsened cough as well as dyspnea since transitioning down to prednisone 20 mg daily  Plan: Continue methotrexate 10 mg weekly Continue prednisone 20 mg daily Continue folic acid Continue Monday Wednesday Friday Bactrim Chest x-ray today Lab work today Walk today in office Proceed forward with cardiac MRI We will discuss case with Dr. Valeta Harms, based off of lab work, x-ray, cardiac MRI may need to consider adding secondary agent or changing treatment options 6-week follow-up with our office

## 2019-10-23 NOTE — Addendum Note (Signed)
Addended by: Suzzanne Cloud E on: 10/23/2019 03:08 PM   Modules accepted: Orders

## 2019-10-23 NOTE — Progress Notes (Signed)
_0  ID: Tamara Myers, female    DOB: [birth year], 64 y.o.   MRN: [id number]  Chief Complaint  Patient presents with   Follow-up    Sarcoid follow-up    Referring provider: Elby Showers, MD  HPI:  64 year old female never smoker followed in our office for sarcoidosis, asthma, history of atypical pneumonia  PMH: Hypertension, basal cell carcinoma, sarcoidosis Smoker/ Smoking History: Never smoker Maintenance: Methotrexate 10 mg, prednisone 20 mg, folic acid, Monday Wednesday Friday Bactrim Pt of: Dr. Valeta Harms  10/23/2019  - Visit   64 year old female never smoker followed in our office for sarcoidosis, asthma and atypical pneumonia.  Patient is followed by Dr. Valeta Harms.  Patient was last seen in our office in February/2021.  Plan of care from that office visit was for the patient to remain on 10 mg of methotrexate once weekly patient to work on decreasing steroid use down.  Patient was to be maintained on 20 mg daily of prednisone.  Patient was encouraged to remain on Monday Wednesday Friday Bactrim as well as folic acid supplementation.  Patient needs repeat lab work today CBC and c-Met as well as a repeat high-resolution CT chest in June/2021.  Patient completed high-resolution CT chest on 06/30/2019 those results listed below:  06/30/2019-CT chest high-res-subtle parenchymal changes in the lungs bilaterally seen on the prior study have largely resolved suggesting positive response to therapy and the patient's history of sarcoidosis, 5 mm subpleural nodule in the anterior aspect of right lower lobe, new compared to previous study, nonspecific but statistically likely benign, mild air trapping indicative of small airways disease, aortic arthrosclerosis  Patient recently completed follow-up with cardiology on 10/22/2019.  Assessment and plan from that office visit listed below:  Coronary artery disease: Coronary CTA on 08/19/2019 showed nonobstructive CAD in the mid LAD (calcium score 61,  80th percentile).  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  Given focal basal inferior hypokinesis on echocardiogram, cardiac MRI ordered to evaluate for cardiac sarcoid  Palpitations: Rare episodes where she feels like heart is racing, will consider cardiac monitor if occurring more frequently.  None recently.  Hyperlipidemia:  LDL 88 on 12/19/2018 on rosuvastatin 5 mg daily.  Increased to 10 mg daily as above for goal LDL less than 70  Hypertension: On amlodipine 5 mg daily and triamterne-HCTZ.  37.5-25 mg.  Appears controlled  RTC in 3 months  Patient presenting to office today.  She reports she is doing okay.  Ever since transitioning down from 30 mg of prednisone to 20 mg she has noticed that she has been coughing more.  She reports she coughs about 3 times a week.  She also has noticed that she had increased shortness of breath since decreasing down to 20 mg.  Patient does not want to remain on prednisone long-term.  She does not like how makes her feel.  She has not yet been scheduled for the cardiac MRI.  They are waiting for insurance approval.   Questionaires / Pulmonary Flowsheets:   ACT:  Asthma Control Test ACT Total Score  07/04/2018 21  06/24/2018 19    MMRC: No flowsheet data found.  Epworth:  No flowsheet data found.  Tests:    Chest Imaging: CT chest 07/03/2018: Evidence of peri-lymphatic bilateral micro-nodularity consistent with sarcoidosis, small mediastinal and hilar lymph nodes.  And a right lower lobe 1 cm nodule likely consistent with the sarcoid however recommending 3 to 69-monthfollow-up by radiology.  06/30/2019-CT  chest high-res-subtle parenchymal changes in the lungs bilaterally seen on the prior study have largely resolved suggesting positive response to therapy and the patient's history of sarcoidosis, 5 mm subpleural nodule in the anterior aspect of right lower lobe, new compared to previous study,  nonspecific but statistically likely benign, mild air trapping indicative of small airways disease, aortic arthrosclerosis   November 2020: CT chest with perilymphatic bilateral micronodularity which is improved in comparison to her previous imaging.  FeNO: None   Pathology:   Bronchoscopy July 2020: Lung, biopsy, right middle lobe - BENIGN LUNG PARENCHYMA WITH GRANULOMATOUS INFLAMMATION. - THERE IS NO EVIDENCE OF MALIGNANCY.  Echocardiogram: None   Heart Catheterization: None   FENO:  No results found for: NITRICOXIDE  PFT: PFT Results Latest Ref Rng & Units 11/06/2018 06/27/2018  FVC-Pre L 2.86 2.82  FVC-Predicted Pre % 88 86  FVC-Post L 2.92 2.82  FVC-Predicted Post % 89 86  Pre FEV1/FVC % % 74 65  Post FEV1/FCV % % 70 76  FEV1-Pre L 2.13 1.83  FEV1-Predicted Pre % 85 73  FEV1-Post L 2.04 2.13  DLCO UNC% % 100 99  DLCO COR %Predicted % 124 109  TLC L 4.74 4.57  TLC % Predicted % 93 90  RV % Predicted % 94 80    WALK:  No flowsheet data found.  Imaging: No results found.  Lab Results:  CBC    Component Value Date/Time   WBC 8.1 06/25/2019 1711   RBC 4.02 06/25/2019 1711   HGB 13.4 06/25/2019 1711   HCT 39.4 06/25/2019 1711   PLT 254.0 06/25/2019 1711   MCV 98.0 06/25/2019 1711   MCV 88.1 07/29/2011 1126   MCH 30.0 12/27/2018 0930   MCHC 34.1 06/25/2019 1711   RDW 15.0 06/25/2019 1711   LYMPHSABS 0.3 (L) 06/25/2019 1711   MONOABS 0.3 06/25/2019 1711   EOSABS 0.0 06/25/2019 1711   BASOSABS 0.1 06/25/2019 1711    BMET    Component Value Date/Time   NA 142 07/15/2019 0936   K 3.7 07/15/2019 0936   CL 99 07/15/2019 0936   CO2 26 07/15/2019 0936   GLUCOSE 100 (H) 07/15/2019 0936   GLUCOSE 109 (H) 06/25/2019 1711   BUN 13 07/15/2019 0936   CREATININE 1.07 (H) 07/15/2019 0936   CREATININE 0.72 03/12/2019 0953   CALCIUM 9.9 07/15/2019 0936   GFRNONAA 55 (L) 07/15/2019 0936   GFRNONAA 90 03/12/2019 0953   GFRAA 64 07/15/2019 0936   GFRAA 104  03/12/2019 0953    BNP No results found for: BNP  ProBNP No results found for: PROBNP  Specialty Problems      Pulmonary Problems   Atypical pneumonia   Cough   Reactive airway disease   LPRD (laryngopharyngeal reflux disease)   Moderate persistent asthma without complication      No Known Allergies  Immunization History  Administered Date(s) Administered   Influenza Inj Mdck Quad Pf 02/13/2014   Influenza Split 02/13/2013   Influenza, Seasonal, Injecte, Preservative Fre 02/13/2013   Influenza,inj,Quad PF,6+ Mos 12/21/2016, 12/25/2017, 01/02/2019   Influenza,trivalent, recombinat, inj, PF 02/09/2011, 02/15/2012   PFIZER SARS-COV-2 Vaccination 07/03/2019, 08/03/2019   Tdap 03/16/2009    Past Medical History:  Diagnosis Date   Cancer ([place])    basal cell carcinoma   Complication of anesthesia    slow to wake up   Dyspnea    on exertion   GERD (gastroesophageal reflux disease)    Hypertension    Melanoma ([place]) 2018  left upper arm   Moderate persistent asthma without complication 35/07/6566   PONV (postoperative nausea and vomiting)    Post menopausal syndrome     Tobacco History: Social History   Tobacco Use  Smoking Status Never Smoker  Smokeless Tobacco Never Used   Counseling given: Yes   Continue to not smoke  Outpatient Encounter Medications as of 10/23/2019  Medication Sig   albuterol (PROVENTIL HFA;VENTOLIN HFA) 108 (90 Base) MCG/ACT inhaler Inhale 2 puffs into the lungs every 6 (six) hours as needed for wheezing or shortness of breath.   amLODipine (NORVASC) 5 MG tablet TAKE 1 TABLET BY MOUTH EVERY DAY   cholecalciferol (VITAMIN D3) 10 MCG (400 UNIT) TABS tablet Take 400 Units by mouth 2 (two) times daily.   Fluticasone-Salmeterol (ADVAIR DISKUS) 100-50 MCG/DOSE AEPB Inhale 1 puff into the lungs 2 (two) times daily.   methotrexate (RHEUMATREX) 2.5 MG tablet Take 4 tablets (10 mg total) by mouth once a week.  Caution:Chemotherapy. Protect from light. Start 5 mg weekly; increased by 2.5 mg every 2 weeks until a dose of 19m reached   predniSONE (DELTASONE) 20 MG tablet Take 1.5 tablets (30 mg total) by mouth daily with breakfast.   rosuvastatin (CRESTOR) 10 MG tablet Take 1 tablet (10 mg total) by mouth daily at 6 PM.   triamterene-hydrochlorothiazide (MAXZIDE-25) 37.5-25 MG tablet TAKE 1 TABLET BY MOUTH EVERY DAY   Vitamin D, Ergocalciferol, (DRISDOL) 1.25 MG (50000 UNIT) CAPS capsule Take 1 capsule (50,000 Units total) by mouth every 7 (seven) days.   [DISCONTINUED] metoprolol tartrate (LOPRESSOR) 100 MG tablet Take 100 mg (1 tablet) TWO hours prior to CT   pantoprazole (PROTONIX) 40 MG tablet Take 1 tablet (40 mg total) by mouth daily.   No facility-administered encounter medications on file as of 10/23/2019.     Review of Systems  Review of Systems  Constitutional: Negative for activity change, fatigue and fever.  HENT: Negative for sinus pressure, sinus pain and sore throat.   Respiratory: Positive for cough and shortness of breath. Negative for wheezing.   Cardiovascular: Negative for chest pain and palpitations.  Gastrointestinal: Negative for diarrhea, nausea and vomiting.  Musculoskeletal: Negative for arthralgias.  Neurological: Negative for dizziness.  Psychiatric/Behavioral: Negative for sleep disturbance. The patient is not nervous/anxious.      Physical Exam  BP 122/76 (BP Location: Left Arm, Cuff Size: Normal)    Pulse 86    Temp 98.7 F (37.1 C) (Oral)    Ht _0  (1.626 m)    Wt 137 lb 9.6 oz (62.4 kg)    SpO2 97%    BMI 23.62 kg/m   Wt Readings from Last 5 Encounters:  10/23/19 137 lb 9.6 oz (62.4 kg)  10/22/19 139 lb (63 kg)  07/15/19 129 lb (58.5 kg)  07/04/19 130 lb (59 kg)  06/25/19 126 lb 9.6 oz (57.4 kg)    BMI Readings from Last 5 Encounters:  10/23/19 23.62 kg/m  10/22/19 23.86 kg/m  07/15/19 22.14 kg/m  07/04/19 22.31 kg/m  06/25/19 21.73  kg/m     Physical Exam Vitals and nursing note reviewed.  Constitutional:      General: She is not in acute distress.    Appearance: Normal appearance. She is normal weight.  HENT:     Head: Normocephalic and atraumatic.     Right Ear: Tympanic membrane, ear canal and external ear normal. There is no impacted cerumen.     Left Ear: Tympanic membrane, ear canal and external ear  normal. There is no impacted cerumen.     Nose: Nose normal. No congestion.     Mouth/Throat:     Mouth: Mucous membranes are moist.     Pharynx: Oropharynx is clear.  Eyes:     Pupils: Pupils are equal, round, and reactive to light.  Cardiovascular:     Rate and Rhythm: Normal rate and regular rhythm.     Pulses: Normal pulses.     Heart sounds: Normal heart sounds. No murmur heard.   Pulmonary:     Effort: Pulmonary effort is normal. No respiratory distress.     Breath sounds: Normal breath sounds. No decreased air movement. No decreased breath sounds, wheezing or rales.  Musculoskeletal:     Cervical back: Normal range of motion.     Right lower leg: No edema.     Left lower leg: No edema.  Skin:    General: Skin is warm and dry.     Capillary Refill: Capillary refill takes less than 2 seconds.  Neurological:     General: No focal deficit present.     Mental Status: She is alert and oriented to person, place, and time. Mental status is at baseline.     Gait: Gait (Tolerated walk in office) normal.  Psychiatric:        Mood and Affect: Mood normal.        Behavior: Behavior normal.        Thought Content: Thought content normal.        Judgment: Judgment normal.       Assessment & Plan:   Sarcoidosis Maintained on methotrexate Recent abnormal echo Cardiac MRI ordered by cardiology Walk today in office stable without any oxygen desaturations Worsened cough as well as dyspnea since transitioning down to prednisone 20 mg daily  Plan: Continue methotrexate 10 mg weekly Continue  prednisone 20 mg daily Continue folic acid Continue Monday Wednesday Friday Bactrim Chest x-ray today Lab work today Walk today in office Proceed forward with cardiac MRI We will discuss case with Dr. Valeta Harms, based off of lab work, x-ray, cardiac MRI may need to consider adding secondary agent or changing treatment options 6-week follow-up with our office  Cough Plan: Continue Advair 100 Chest x-ray today Lab work today Continue methotrexate Continue prednisone     Return in about 6 weeks (around 12/04/2019), or if symptoms worsen or fail to improve, for Follow up with Dr. Valeta Harms.   Lauraine Rinne, NP 10/23/2019   This appointment required 34 minutes of patient care (this includes precharting, chart review, review of results, face-to-face care, etc.).

## 2019-10-23 NOTE — Progress Notes (Signed)
Many thanks. 

## 2019-10-23 NOTE — Progress Notes (Signed)
Anytime   Aaron Edelman

## 2019-10-24 ENCOUNTER — Telehealth: Payer: Self-pay | Admitting: *Deleted

## 2019-10-24 ENCOUNTER — Encounter: Payer: Self-pay | Admitting: Cardiology

## 2019-10-24 NOTE — Telephone Encounter (Signed)
Spoke with patient regarding appointment for Cardiac MRI scheduled Friday 11/28/19 at 8:00 am at [facility]----arrival time is 7:15 am 1st floor admissions office for check in.  Will  mail informatoin to patient and it is available in My Chart.  Patient voiced her understanding.

## 2019-10-30 NOTE — Progress Notes (Signed)
Spoke with the pt and notified her of results

## 2019-11-02 NOTE — Progress Notes (Signed)
[facility]: Thanks for getting setup Garner Nash, DO [facility] 11/02/2019 5:58 PM

## 2019-11-23 DIAGNOSIS — D869 Sarcoidosis, unspecified: Secondary | ICD-10-CM

## 2019-11-24 MED ORDER — SULFAMETHOXAZOLE-TRIMETHOPRIM 800-160 MG PO TABS: 1.0000 | ORAL_TABLET | ORAL | 1 refills | Status: AC

## 2019-11-24 NOTE — Telephone Encounter (Signed)
11/24/2019  Yes this is a maintenance medication.  This should be refilled.  Can go ahead and give 6 refills for the Bactrim.  Should be continued Monday Wednesday Friday.  Wyn Quaker, FNP

## 2019-11-24 NOTE — Telephone Encounter (Signed)
Office visit notes from 10/23/2019 mention Bactrim on Monday, Wednesday, Friday. Patient is requesting refill but the medication is not currently on her profile. Please advise.

## 2019-11-27 ENCOUNTER — Telehealth (HOSPITAL_COMMUNITY): Payer: Self-pay | Admitting: *Deleted

## 2019-11-27 NOTE — Telephone Encounter (Signed)
Attempted to call patient regarding upcoming cardiac CT appointment. Left message on voicemail with name and callback number  Loreli Debruler Tai RN Navigator Cardiac Imaging [facility] [phone] Office [phone] Cell  

## 2019-11-27 NOTE — Telephone Encounter (Signed)
Patient returning call regarding upcoming cardiac imaging study; pt verbalizes understanding of appt date/time, parking situation and where to check in, and verified current allergies; name and call back number provided for further questions should they arise  [name] RN Navigator Cardiac [facility] [phone] office [phone] cell

## 2019-11-28 ENCOUNTER — Other Ambulatory Visit: Payer: Self-pay

## 2019-11-28 ENCOUNTER — Ambulatory Visit (HOSPITAL_COMMUNITY)
Admission: RE | Admit: 2019-11-28 | Discharge: 2019-11-28 | Disposition: A | Discharge status: Home / Self Care | Payer: BC Managed Care – PPO | Source: Ambulatory Visit | Attending: Cardiology | Admitting: Cardiology

## 2019-11-28 ENCOUNTER — Other Ambulatory Visit: Payer: Self-pay | Admitting: *Deleted

## 2019-11-28 DIAGNOSIS — D869 Sarcoidosis, unspecified: Secondary | ICD-10-CM

## 2019-11-28 MED ORDER — GADOBUTROL 1 MMOL/ML IV SOLN
8.0000 mL | Freq: Once | INTRAVENOUS | Status: AC | PRN
  Administered 2019-11-28: 8 mL via INTRAVENOUS

## 2019-11-28 MED ORDER — FOLIC ACID 1 MG PO TABS
1.0000 mg | ORAL_TABLET | Freq: Every day | ORAL | 0 refills | Status: DC
Start: 2019-11-28 — End: 2020-03-02

## 2019-12-04 ENCOUNTER — Other Ambulatory Visit: Payer: Self-pay

## 2019-12-04 ENCOUNTER — Encounter: Payer: Self-pay | Admitting: Pulmonary Disease

## 2019-12-04 ENCOUNTER — Ambulatory Visit: Payer: BC Managed Care – PPO | Admitting: Pulmonary Disease

## 2019-12-04 VITALS — BP 130/72 | HR 78 | Temp 98.9°F | Ht 64.0 in | Wt 139.8 lb

## 2019-12-04 DIAGNOSIS — D869 Sarcoidosis, unspecified: Secondary | ICD-10-CM

## 2019-12-04 DIAGNOSIS — Z5181 Encounter for therapeutic drug level monitoring: Secondary | ICD-10-CM

## 2019-12-04 DIAGNOSIS — Z79899 Other long term (current) drug therapy: Secondary | ICD-10-CM

## 2019-12-04 DIAGNOSIS — Z79631 Long term (current) use of antimetabolite agent: Secondary | ICD-10-CM

## 2019-12-04 MED ORDER — PREDNISONE 5 MG PO TABS
ORAL_TABLET | ORAL | 0 refills | Status: DC
Start: 2019-12-04 — End: 2020-03-17

## 2019-12-04 NOTE — Progress Notes (Signed)
Synopsis: Referred in March 2020 for abnormal CT of the chest by Elby Showers, MD  Subjective:   PATIENT ID: Tamara Myers GENDER: female DOB: 1955-07-27, MRN: [id number]  Chief Complaint  Patient presents with   Follow-up    No complaints    Patient has had complaints of progressive cough for the past year.  She was recently placed on a PPI for treatment of reflux.  She was ultimately referred to allergy.  She saw Dr. Maudie Mercury which evaluated for her cough.  She had a chest x-ray which revealed interstitial markings.  She ultimately had a noncontrast CT of the chest which revealed perilymphatic micro-nodularity consistent with pulmonary sarcoidosis with small areas of bilateral hilar adenopathy and a right-sided 1.4 cm hilar lymph node.  She had a dominant right-sided right lower lobe nodule that measured 1 cm in size also likely secondary to sarcoid however recommending a 3 to 21-month follow-up.  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  No family history of sarcoidosis.  Father died of pulmonary fibrosis.  Patient has not had any eye or skin symptoms.  OV 03/12/2019:Patient underwent bronchoscopy November 20, 2018.  Bronchoscopy tissue biopsy revealed granulomatous inflammation.  Follow-up visit was via telephone.  In August 2020.  She was then placed on a 64-month taper of prednisone with PCP prophylaxis of Bactrim.  Patient here today for follow-up after recent diagnosis of sarcoidosis.  Overall patient doing well today.  She recently completed her 36-month taper of prednisone.  Over the past 2 weeks she has noticed increase in her cough.  Today reviewed her CT scan which was completed getting of week which revealed improvement of the nodularity and parenchymal changes consistent with sarcoid however they are still present.  She still has dry cough.  OV 04/17/2019: Patient  here today for follow-up after starting methotrexate and restarting her oral prednisone for sarcoidosis.  Doing well today.  Respiratory status is much improved as to previous.  She does not have significant cough anymore.  She feels a little bit better when it comes to her dyspnea on exertion but is still about the same.  Occasionally has cough but not much.  She has been faithful with taking her medication.  She has uptitrated methotrexate to 10 mg daily.  She is planned for labs today.  She overall denies hemoptysis weight loss, fevers chills night sweats.  OV 06/25/2019: Respiratory symptoms stable at this time.  She does have some exertional dyspnea when she does yoga in the evenings.  Otherwise she is able to complete most activities of daily living.  Still trying to stay social distance.  She has noticed some puffiness in her cheeks that she feels may be related to fluid retention from her steroids.  She has noticed easy bruising as well.  Also right eye had a subconjunctival hemorrhage which was new that occurred spontaneously.  Patient denies hemoptysis fevers chills night sweats weight loss.  OV 12/04/2019: Patient here today for follow-up of sarcoidosis. Otherwise doing well with no symptoms at this time. Taking 10 mg of prednisone today and 10 mg weekly of methotrexate. She has noticed some weight gain. She does feel like her face is a little bit more puffy. We did review her CT imaging that was completed and March 2021 which showed improvement. She has had 2 subsequent follow-ups with Wyn Quaker, NP since last  time I saw her in the office. This is a     Past Medical History:  Diagnosis Date   Cancer ([place])    basal cell carcinoma   Complication of anesthesia    slow to wake up   Dyspnea    on exertion   GERD (gastroesophageal reflux disease)    Hypertension    Melanoma ([place]) 2018   left upper arm   Moderate persistent asthma without complication 38/07/5362   PONV (postoperative  nausea and vomiting)    Post menopausal syndrome      Family History  Problem Relation Age of Onset   Diabetes Father    Heart disease Father    Hyperlipidemia Father    Hypertension Father    Pulmonary fibrosis Father    Asthma Daughter    Breast cancer Neg Hx    Allergic rhinitis Neg Hx    Eczema Neg Hx    Urticaria Neg Hx    Angioedema Neg Hx      Past Surgical History:  Procedure Laterality Date   CESAREAN SECTION     OPEN REDUCTION INTERNAL FIXATION (ORIF) METACARPAL Right 11/24/2014   Procedure: OPEN REDUCTION INTERNAL FIXATION (ORIF) RIGHT THUMB;  Surgeon: Iran Planas, MD;  Location: [facility];  Service: Orthopedics;  Laterality: Right;   thumb surgery Right 2014   VIDEO BRONCHOSCOPY N/A 11/20/2018   Procedure: Video Bronchoscopy With Fluoro;  Surgeon: Garner Nash, DO;  Location: [facility];  Service: Thoracic;  Laterality: N/A;   VIDEO BRONCHOSCOPY WITH ENDOBRONCHIAL ULTRASOUND N/A 11/20/2018   Procedure: VIDEO BRONCHOSCOPY WITH ENDOBRONCHIAL ULTRASOUND;  Surgeon: Garner Nash, DO;  Location: [facility] OR;  Service: Thoracic;  Laterality: N/A;    Social History   Socioeconomic History   Marital status: Married    Spouse name: Not on file   Number of children: Not on file   Years of education: Not on file   Highest education level: Not on file  Occupational History   Not on file  Tobacco Use   Smoking status: Never Smoker   Smokeless tobacco: Never Used  Vaping Use   Vaping Use: Never used  Substance and Sexual Activity   Alcohol use: No   Drug use: No   Sexual activity: Not on file  Other Topics Concern   Not on file  Social History Narrative   Not on file   Social Determinants of Health   Financial Resource Strain:    Difficulty of Paying Living Expenses:   Food Insecurity:    Worried About Charity fundraiser in the Last Year:    Arboriculturist in the Last Year:   Transportation Needs:    Film/video editor  (Medical):    Lack of Transportation (Non-Medical):   Physical Activity:    Days of Exercise per Week:    Minutes of Exercise per Session:   Stress:    Feeling of Stress :   Social Connections:    Frequency of Communication with Friends and Family:    Frequency of Social Gatherings with Friends and Family:    Attends Religious Services:    Active Member of Clubs or Organizations:    Attends Archivist Meetings:    Marital Status:   Intimate Partner Violence:    Fear of Current or Ex-Partner:    Emotionally Abused:    Physically Abused:    Sexually Abused:      No Known Allergies   Outpatient Medications Prior to Visit  Medication Sig Dispense Refill   albuterol (PROVENTIL HFA;VENTOLIN HFA) 108 (90 Base) MCG/ACT inhaler Inhale 2 puffs into the lungs every 6 (six) hours as needed for wheezing or shortness of breath. 18 g 1   amLODipine (NORVASC) 5 MG tablet TAKE 1 TABLET BY MOUTH EVERY DAY 90 tablet 3   cholecalciferol (VITAMIN D3) 10 MCG (400 UNIT) TABS tablet Take 400 Units by mouth 2 (two) times daily.     Fluticasone-Salmeterol (ADVAIR DISKUS) 100-50 MCG/DOSE AEPB Inhale 1 puff into the lungs 2 (two) times daily. 60 each 3   folic acid (FOLVITE) 1 MG tablet Take 1 tablet (1 mg total) by mouth daily. 90 tablet 0   methotrexate (RHEUMATREX) 2.5 MG tablet Take 4 tablets (10 mg total) by mouth once a week. Caution:Chemotherapy. Protect from light. Start 5 mg weekly; increased by 2.5 mg every 2 weeks until a dose of 10mg  reached 144 tablet 1   predniSONE (DELTASONE) 10 MG tablet Take 10 mg by mouth daily with breakfast.     rosuvastatin (CRESTOR) 10 MG tablet Take 1 tablet (10 mg total) by mouth daily at 6 PM. 90 tablet 3   sulfamethoxazole-trimethoprim (BACTRIM DS) 800-160 MG tablet Take 1 tablet by mouth 3 (three) times a week. 36 tablet 1   triamterene-hydrochlorothiazide (MAXZIDE-25) 37.5-25 MG tablet TAKE 1 TABLET BY MOUTH EVERY DAY 90 tablet 2     Vitamin D, Ergocalciferol, (DRISDOL) 1.25 MG (50000 UNIT) CAPS capsule Take 1 capsule (50,000 Units total) by mouth every 7 (seven) days. 12 capsule 1   pantoprazole (PROTONIX) 40 MG tablet Take 1 tablet (40 mg total) by mouth daily. 90 tablet 3   No facility-administered medications prior to visit.    Review of Systems  Constitutional: Negative for chills, fever, malaise/fatigue and weight loss.  HENT: Negative for hearing loss, sore throat and tinnitus.   Eyes: Negative for blurred vision and double vision.  Respiratory: Negative for cough, hemoptysis, sputum production, shortness of breath, wheezing and stridor.   Cardiovascular: Negative for chest pain, palpitations, orthopnea, leg swelling and PND.  Gastrointestinal: Negative for abdominal pain, constipation, diarrhea, heartburn, nausea and vomiting.  Genitourinary: Negative for dysuria, hematuria and urgency.  Musculoskeletal: Negative for joint pain and myalgias.  Skin: Negative for itching and rash.  Neurological: Negative for dizziness, tingling, weakness and headaches.  Endo/Heme/Allergies: Negative for environmental allergies. Bruises/bleeds easily.  Psychiatric/Behavioral: Negative for depression. The patient is not nervous/anxious and does not have insomnia.   All other systems reviewed and are negative.    Objective:  Physical Exam Vitals reviewed.  Constitutional:      General: She is not in acute distress.    Appearance: She is well-developed.  HENT:     Head: Normocephalic and atraumatic.  Eyes:     General: No scleral icterus.    Conjunctiva/sclera: Conjunctivae normal.     Pupils: Pupils are equal, round, and reactive to light.  Neck:     Vascular: No JVD.     Trachea: No tracheal deviation.  Cardiovascular:     Rate and Rhythm: Normal rate and regular rhythm.     Heart sounds: Normal heart sounds. No murmur heard.   Pulmonary:     Effort: Pulmonary effort is normal. No tachypnea, accessory muscle  usage or respiratory distress.     Breath sounds: No stridor. No wheezing, rhonchi or rales.  Abdominal:     General: Bowel sounds are normal. There is no distension.     Palpations: Abdomen is soft.  Tenderness: There is no abdominal tenderness.  Musculoskeletal:        General: No tenderness.     Cervical back: Neck supple.  Lymphadenopathy:     Cervical: No cervical adenopathy.  Skin:    General: Skin is warm and dry.     Capillary Refill: Capillary refill takes less than 2 seconds.     Findings: No rash.  Neurological:     Mental Status: She is alert and oriented to person, place, and time.  Psychiatric:        Behavior: Behavior normal.      Vitals:   12/04/19 1550  BP: 130/72  Pulse: 78  Temp: 98.9 F (37.2 C)  TempSrc: Oral  SpO2: 97%  Weight: 139 lb 12.8 oz (63.4 kg)  Height: 5\' 4"  (1.626 m)   97% on RA BMI Readings from Last 3 Encounters:  12/04/19 24.00 kg/m  10/23/19 23.62 kg/m  10/22/19 23.86 kg/m   Wt Readings from Last 3 Encounters:  12/04/19 139 lb 12.8 oz (63.4 kg)  10/23/19 137 lb 9.6 oz (62.4 kg)  10/22/19 139 lb (63 kg)     CBC    Component Value Date/Time   WBC 6.6 10/23/2019 1509   RBC 4.11 10/23/2019 1509   HGB 14.1 10/23/2019 1509   HCT 40.5 10/23/2019 1509   PLT 264.0 10/23/2019 1509   MCV 98.4 10/23/2019 1509   MCV 88.1 07/29/2011 1126   MCH 30.0 12/27/2018 0930   MCHC 34.8 10/23/2019 1509   RDW 13.8 10/23/2019 1509   LYMPHSABS 0.3 (L) 10/23/2019 1509   MONOABS 0.3 10/23/2019 1509   EOSABS 0.0 10/23/2019 1509   BASOSABS 0.0 10/23/2019 1509    Chest Imaging: CT chest 07/03/2018: Evidence of peri-lymphatic bilateral micro-nodularity consistent with sarcoidosis, small mediastinal and hilar lymph nodes.  And a right lower lobe 1 cm nodule likely consistent with the sarcoid however recommending 3 to 13-month follow-up by radiology. The patient's images have been independently reviewed by me.    November 2020: CT chest with  perilymphatic bilateral micronodularity which is improved in comparison to her previous imaging. The patient's images have been independently reviewed by me.    Pulmonary Functions Testing Results: PFT Results Latest Ref Rng & Units 11/06/2018 06/27/2018  FVC-Pre L 2.86 2.82  FVC-Predicted Pre % 88 86  FVC-Post L 2.92 2.82  FVC-Predicted Post % 89 86  Pre FEV1/FVC % % 74 65  Post FEV1/FCV % % 70 76  FEV1-Pre L 2.13 1.83  FEV1-Predicted Pre % 85 73  FEV1-Post L 2.04 2.13  DLCO uncorrected ml/min/mmHg 20.13 20.01  DLCO UNC% % 100 99  DLVA Predicted % 124 109  TLC L 4.74 4.57  TLC % Predicted % 93 90  RV % Predicted % 94 80     FeNO: None   Pathology:   Bronchoscopy July 2020: Lung, biopsy, right middle lobe - BENIGN LUNG PARENCHYMA WITH GRANULOMATOUS INFLAMMATION. - THERE IS NO EVIDENCE OF MALIGNANCY.  Echocardiogram: None   Heart Catheterization: None     Assessment & Plan:   Sarcoidosis - Plan: Pulmonary Function Test, Pulmonary function test  On methotrexate therapy  Encounter for monitoring of methotrexate therapy  Encounter for therapeutic drug monitoring  Discussion:  64 year old female, parenchymal sarcoidosis and chronic cough. Had palpitations and was seen by cardiology had a recent cardiac MRI that was negative for any late gadolinium enhancement and no evidence of cardiac sarcoidosis.  Plan: Continue to titrate down on prednisone. Continue 10 mg methotrexate  once weekly Drop prednisone dosing to 5 mg daily x4 weeks, 2.5 mg daily x4 weeks and then stop. Can wait 4 weeks and then stop Bactrim prophylaxis.  Greater than 50% of this patient's 25-minute of visit face-to-face discussing above recommendations and treatment plan.   Current Outpatient Medications:    albuterol (PROVENTIL HFA;VENTOLIN HFA) 108 (90 Base) MCG/ACT inhaler, Inhale 2 puffs into the lungs every 6 (six) hours as needed for wheezing or shortness of breath., Disp: 18 g, Rfl: 1    amLODipine (NORVASC) 5 MG tablet, TAKE 1 TABLET BY MOUTH EVERY DAY, Disp: 90 tablet, Rfl: 3   cholecalciferol (VITAMIN D3) 10 MCG (400 UNIT) TABS tablet, Take 400 Units by mouth 2 (two) times daily., Disp: , Rfl:    Fluticasone-Salmeterol (ADVAIR DISKUS) 100-50 MCG/DOSE AEPB, Inhale 1 puff into the lungs 2 (two) times daily., Disp: 60 each, Rfl: 3   folic acid (FOLVITE) 1 MG tablet, Take 1 tablet (1 mg total) by mouth daily., Disp: 90 tablet, Rfl: 0   methotrexate (RHEUMATREX) 2.5 MG tablet, Take 4 tablets (10 mg total) by mouth once a week. Caution:Chemotherapy. Protect from light. Start 5 mg weekly; increased by 2.5 mg every 2 weeks until a dose of 10mg  reached, Disp: 144 tablet, Rfl: 1   predniSONE (DELTASONE) 10 MG tablet, Take 10 mg by mouth daily with breakfast., Disp: , Rfl:    rosuvastatin (CRESTOR) 10 MG tablet, Take 1 tablet (10 mg total) by mouth daily at 6 PM., Disp: 90 tablet, Rfl: 3   sulfamethoxazole-trimethoprim (BACTRIM DS) 800-160 MG tablet, Take 1 tablet by mouth 3 (three) times a week., Disp: 36 tablet, Rfl: 1   triamterene-hydrochlorothiazide (MAXZIDE-25) 37.5-25 MG tablet, TAKE 1 TABLET BY MOUTH EVERY DAY, Disp: 90 tablet, Rfl: 2   Vitamin D, Ergocalciferol, (DRISDOL) 1.25 MG (50000 UNIT) CAPS capsule, Take 1 capsule (50,000 Units total) by mouth every 7 (seven) days., Disp: 12 capsule, Rfl: 1   pantoprazole (PROTONIX) 40 MG tablet, Take 1 tablet (40 mg total) by mouth daily., Disp: 90 tablet, Rfl: 3   Garner Nash, DO [facility] 12/04/2019 4:06 PM

## 2019-12-04 NOTE — Patient Instructions (Addendum)
Thank you for visiting Dr. Valeta Harms at [facility]. Today we recommend the following:  5 mg prednisone X 4 weeks 2.5mg  prednisone X 4 weeks  Then stop prednisone  Once you stop prednisone you can also stop TMP/SMX 4 weeks later  Return in about 3 months (around 03/05/2020). PFTS prior to next office visit    Please do your part to reduce the spread of COVID-19.

## 2019-12-09 ENCOUNTER — Other Ambulatory Visit: Payer: Self-pay | Admitting: Internal Medicine

## 2019-12-09 DIAGNOSIS — Z1231 Encounter for screening mammogram for malignant neoplasm of breast: Secondary | ICD-10-CM

## 2019-12-18 ENCOUNTER — Ambulatory Visit
Admission: RE | Admit: 2019-12-18 | Discharge: 2019-12-18 | Disposition: A | Discharge status: Home / Self Care | Payer: BC Managed Care – PPO | Source: Ambulatory Visit | Attending: Internal Medicine | Admitting: Internal Medicine

## 2019-12-18 DIAGNOSIS — Z1231 Encounter for screening mammogram for malignant neoplasm of breast: Secondary | ICD-10-CM | POA: Diagnosis not present

## 2019-12-22 ENCOUNTER — Other Ambulatory Visit: Payer: Self-pay | Admitting: Internal Medicine

## 2019-12-22 DIAGNOSIS — N6459 Other signs and symptoms in breast: Secondary | ICD-10-CM

## 2019-12-22 DIAGNOSIS — R928 Other abnormal and inconclusive findings on diagnostic imaging of breast: Secondary | ICD-10-CM

## 2019-12-23 ENCOUNTER — Other Ambulatory Visit: Payer: Self-pay | Admitting: Internal Medicine

## 2019-12-24 ENCOUNTER — Ambulatory Visit
Admission: RE | Admit: 2019-12-24 | Discharge: 2019-12-24 | Disposition: A | Discharge status: Home / Self Care | Payer: BC Managed Care – PPO | Source: Ambulatory Visit | Attending: Internal Medicine | Admitting: Internal Medicine

## 2019-12-24 ENCOUNTER — Other Ambulatory Visit: Payer: Self-pay

## 2019-12-24 ENCOUNTER — Ambulatory Visit: Payer: BC Managed Care – PPO

## 2019-12-24 DIAGNOSIS — R928 Other abnormal and inconclusive findings on diagnostic imaging of breast: Secondary | ICD-10-CM | POA: Diagnosis not present

## 2019-12-24 DIAGNOSIS — N6459 Other signs and symptoms in breast: Secondary | ICD-10-CM

## 2020-01-06 ENCOUNTER — Other Ambulatory Visit: Payer: BC Managed Care – PPO | Admitting: Internal Medicine

## 2020-01-06 ENCOUNTER — Other Ambulatory Visit: Payer: Self-pay

## 2020-01-06 DIAGNOSIS — I2510 Atherosclerotic heart disease of native coronary artery without angina pectoris: Secondary | ICD-10-CM | POA: Diagnosis not present

## 2020-01-06 DIAGNOSIS — I10 Essential (primary) hypertension: Secondary | ICD-10-CM | POA: Diagnosis not present

## 2020-01-06 DIAGNOSIS — Z0000 Encounter for general adult medical examination without abnormal findings: Secondary | ICD-10-CM

## 2020-01-06 DIAGNOSIS — E7800 Pure hypercholesterolemia, unspecified: Secondary | ICD-10-CM | POA: Diagnosis not present

## 2020-01-06 DIAGNOSIS — E559 Vitamin D deficiency, unspecified: Secondary | ICD-10-CM

## 2020-01-07 ENCOUNTER — Ambulatory Visit
Admission: RE | Admit: 2020-01-07 | Discharge: 2020-01-07 | Disposition: A | Discharge status: Home / Self Care | Payer: BC Managed Care – PPO | Source: Ambulatory Visit | Attending: Internal Medicine | Admitting: Internal Medicine

## 2020-01-07 ENCOUNTER — Encounter: Payer: Self-pay | Admitting: Internal Medicine

## 2020-01-07 ENCOUNTER — Ambulatory Visit (INDEPENDENT_AMBULATORY_CARE_PROVIDER_SITE_OTHER): Payer: BC Managed Care – PPO | Admitting: Internal Medicine

## 2020-01-07 ENCOUNTER — Telehealth: Payer: Self-pay

## 2020-01-07 DIAGNOSIS — S22078A Other fracture of T9-T10 vertebra, initial encounter for closed fracture: Secondary | ICD-10-CM | POA: Diagnosis not present

## 2020-01-07 DIAGNOSIS — M546 Pain in thoracic spine: Secondary | ICD-10-CM | POA: Diagnosis not present

## 2020-01-07 DIAGNOSIS — D869 Sarcoidosis, unspecified: Secondary | ICD-10-CM | POA: Diagnosis not present

## 2020-01-07 DIAGNOSIS — M47814 Spondylosis without myelopathy or radiculopathy, thoracic region: Secondary | ICD-10-CM | POA: Diagnosis not present

## 2020-01-07 DIAGNOSIS — R0781 Pleurodynia: Secondary | ICD-10-CM | POA: Diagnosis not present

## 2020-01-07 DIAGNOSIS — Z92241 Personal history of systemic steroid therapy: Secondary | ICD-10-CM

## 2020-01-07 DIAGNOSIS — S22089A Unspecified fracture of T11-T12 vertebra, initial encounter for closed fracture: Secondary | ICD-10-CM | POA: Diagnosis not present

## 2020-01-07 DIAGNOSIS — S2232XA Fracture of one rib, left side, initial encounter for closed fracture: Secondary | ICD-10-CM | POA: Diagnosis not present

## 2020-01-07 DIAGNOSIS — S22000A Wedge compression fracture of unspecified thoracic vertebra, initial encounter for closed fracture: Secondary | ICD-10-CM | POA: Diagnosis not present

## 2020-01-07 DIAGNOSIS — M419 Scoliosis, unspecified: Secondary | ICD-10-CM | POA: Diagnosis not present

## 2020-01-07 LAB — TSH: TSH: 1.46 mIU/L (ref 0.40–4.50)

## 2020-01-07 LAB — COMPLETE METABOLIC PANEL WITH GFR
AG Ratio: 2.8 (calc) — ABNORMAL HIGH (ref 1.0–2.5)
ALT: 27 U/L (ref 6–29)
AST: 25 U/L (ref 10–35)
Albumin: 4.4 g/dL (ref 3.6–5.1)
Alkaline phosphatase (APISO): 45 U/L (ref 37–153)
BUN/Creatinine Ratio: 12 (calc) (ref 6–22)
BUN: 15 mg/dL (ref 7–25)
CO2: 30 mmol/L (ref 20–32)
Calcium: 10 mg/dL (ref 8.6–10.4)
Chloride: 100 mmol/L (ref 98–110)
Creat: 1.25 mg/dL — ABNORMAL HIGH (ref 0.50–0.99)
GFR, Est African American: 53 mL/min/{1.73_m2} — ABNORMAL LOW (ref >=60)
GFR, Est Non African American: 46 mL/min/{1.73_m2} — ABNORMAL LOW (ref >=60)
Globulin: 1.6 g/dL (calc) — ABNORMAL LOW (ref 1.9–3.7)
Glucose, Bld: 89 mg/dL (ref 65–99)
Potassium: 3.6 mmol/L (ref 3.5–5.3)
Sodium: 142 mmol/L (ref 135–146)
Total Bilirubin: 1 mg/dL (ref 0.2–1.2)
Total Protein: 6 g/dL — ABNORMAL LOW (ref 6.1–8.1)

## 2020-01-07 LAB — CBC WITH DIFFERENTIAL/PLATELET
Absolute Monocytes: 359 cells/uL (ref 200–950)
Basophils Absolute: 13 cells/uL (ref 0–200)
Basophils Relative: 0.2 %
Eosinophils Absolute: 13 cells/uL — ABNORMAL LOW (ref 15–500)
Eosinophils Relative: 0.2 %
HCT: 39.1 % (ref 35.0–45.0)
Hemoglobin: 13.4 g/dL (ref 11.7–15.5)
Lymphs Abs: 277 cells/uL — ABNORMAL LOW (ref 850–3900)
MCH: 33.4 pg — ABNORMAL HIGH (ref 27.0–33.0)
MCHC: 34.3 g/dL (ref 32.0–36.0)
MCV: 97.5 fL (ref 80.0–100.0)
MPV: 10.2 fL (ref 7.5–12.5)
Monocytes Relative: 5.7 %
Neutro Abs: 5639 cells/uL (ref 1500–7800)
Neutrophils Relative %: 89.5 %
Platelets: 265 10*3/uL (ref 140–400)
RBC: 4.01 10*6/uL (ref 3.80–5.10)
RDW: 12.6 % (ref 11.0–15.0)
Total Lymphocyte: 4.4 %
WBC: 6.3 10*3/uL (ref 3.8–10.8)

## 2020-01-07 LAB — LIPID PANEL
Cholesterol: 178 mg/dL (ref <200)
HDL: 99 mg/dL (ref >=50)
LDL Cholesterol (Calc): 60 mg/dL (calc)
Non-HDL Cholesterol (Calc): 79 mg/dL (calc) (ref <130)
Total CHOL/HDL Ratio: 1.8 (calc) (ref <5.0)
Triglycerides: 103 mg/dL (ref <150)

## 2020-01-07 LAB — VITAMIN D 25 HYDROXY (VIT D DEFICIENCY, FRACTURES): Vit D, 25-Hydroxy: 76 ng/mL (ref 30–100)

## 2020-01-07 MED ORDER — HYDROCODONE-ACETAMINOPHEN 5-325 MG PO TABS: 1.0000 | ORAL_TABLET | Freq: Three times a day (TID) | ORAL | 0 refills | Status: DC | PRN

## 2020-01-07 NOTE — Telephone Encounter (Signed)
She needs an appointment. Can she come about 3 pm?

## 2020-01-07 NOTE — Telephone Encounter (Signed)
Patient called she woke up with upper back pain she said she can hardly get up, she has a heating pad on it but it doesn't seem to help. She said she moved some files at her job last week and did a lot of walking on Saturday she wants to know if you recommend something for her back?    [phone]

## 2020-01-07 NOTE — Patient Instructions (Signed)
Take Norco 5/325 sparingly for rib cage and back pain.  Follow-up here tomorrow.  Have x-rays of thoracic spine and left rib detail films.

## 2020-01-07 NOTE — Progress Notes (Signed)
   Subjective:    Patient ID: Tamara Myers, female    DOB: December 03, 1955, 64 y.o.   MRN: [id number]  HPI 64 year old Female called today with acute back pain.  Says she can hardly get out of bed this morning.  When she attempted to move to get out of bed she had rather severe low back pain and lower thoracic spine area.  Was able to eventually get out of bed.  Patient has a history of sarcoidosis and is followed by [facility].  Currently on prednisone 5 mg daily but took higher doses when first diagnosed by allergist whom she is all regarding asthma in March 2020.  Cough for a number of months.  Bilateral.  Lymphatic micro nodularity consistent with sarcoidosis.  Also had increased soft tissue within bilateral hilar regions with an enlarged right hilar lymph node measuring 1.4 cm likely reflecting sarcoidosis.  Is also been placed on methotrexate by Dr. Valeta Harms.  Takes Protonix for GE reflux.  Has albuterol inhaler prescribed by allergist.  Has mild hypertension treated with Maxide 25.  Takes high-dose vitamin D weekly.  Currently on Crestor 10 mg daily.  She had mammogram in August 2021 requiring tomography of left breast which revealed no abnormality other than overlapping fibroglandular tissue.  I do not see a bone density study.  Social history: Last week she was moving a couple of notebooks and experienced acute pain along the left lower rib area.  Pain was rather sudden and she did not feel me.  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  She is married.  2 adult children.  She does not smoke or consume alcohol.  She became menopausal in 2000.  Remote history of dysplastic nevus right leg treated by dermatologist as well as a basal cell carcinoma removed in the past.  History of fractured right fifth metacarpal, fracture left fifth finger at age 7.  Family history: Father with history of BOOP syndrome hyperlipidemia, hypertension died from respiratory failure secondary to pulmonary fibrosis with  history of CABG and diabetes.  Mother with history of macular degeneration, remote history of melanoma and Mnire's disease.      Review of Systems main complaint is musculoskeletal pain lower thoracic spine and to the left along low more posterior rib cage area.  No shortness of breath or chest pain.     Objective:   Physical Exam Blood pressure 130/80, pulse regular, respiratory rate is regular.  Seen sitting on exam table.  He has point tenderness along the left lower posterior rib cage area.  She also has tenderness to palpation lower thoracic spine area.       Assessment & Plan:  History of sarcoidosis with prolonged steroid therapy now on prednisone 5 mg daily  Needs bone density study with prolonged steroids for several months  Feel that she likely has musculoskeletal pain but cannot rule out compression fracture of T-spine.  Plan: She is going to have spine films today and left rib detail.  She has an appointment for health maintenance exam tomorrow.  Prescribed Norco 5/325 to take sparingly every 8 hours as needed for pain.  Time spent reviewing records, seeing patient, reviewing x-rays and is 30 minutes.

## 2020-01-08 ENCOUNTER — Ambulatory Visit (INDEPENDENT_AMBULATORY_CARE_PROVIDER_SITE_OTHER): Payer: BC Managed Care – PPO | Admitting: Internal Medicine

## 2020-01-08 ENCOUNTER — Other Ambulatory Visit: Payer: Self-pay

## 2020-01-08 ENCOUNTER — Encounter: Payer: Self-pay | Admitting: Internal Medicine

## 2020-01-08 VITALS — BP 150/90 | HR 86 | Temp 98.3°F | Ht 62.5 in | Wt 136.0 lb

## 2020-01-08 DIAGNOSIS — Z92241 Personal history of systemic steroid therapy: Secondary | ICD-10-CM | POA: Diagnosis not present

## 2020-01-08 DIAGNOSIS — Z23 Encounter for immunization: Secondary | ICD-10-CM

## 2020-01-08 DIAGNOSIS — S22070A Wedge compression fracture of T9-T10 vertebra, initial encounter for closed fracture: Secondary | ICD-10-CM

## 2020-01-08 DIAGNOSIS — M8448XA Pathological fracture, other site, initial encounter for fracture: Secondary | ICD-10-CM

## 2020-01-08 DIAGNOSIS — I10 Essential (primary) hypertension: Secondary | ICD-10-CM | POA: Diagnosis not present

## 2020-01-08 DIAGNOSIS — D869 Sarcoidosis, unspecified: Secondary | ICD-10-CM

## 2020-01-08 DIAGNOSIS — E7800 Pure hypercholesterolemia, unspecified: Secondary | ICD-10-CM

## 2020-01-08 DIAGNOSIS — D860 Sarcoidosis of lung: Secondary | ICD-10-CM

## 2020-01-08 DIAGNOSIS — Z8719 Personal history of other diseases of the digestive system: Secondary | ICD-10-CM

## 2020-01-08 DIAGNOSIS — S2249XA Multiple fractures of ribs, unspecified side, initial encounter for closed fracture: Secondary | ICD-10-CM

## 2020-01-08 DIAGNOSIS — M4854XA Collapsed vertebra, not elsewhere classified, thoracic region, initial encounter for fracture: Secondary | ICD-10-CM

## 2020-01-08 DIAGNOSIS — Z0000 Encounter for general adult medical examination without abnormal findings: Secondary | ICD-10-CM | POA: Diagnosis not present

## 2020-01-08 DIAGNOSIS — R7989 Other specified abnormal findings of blood chemistry: Secondary | ICD-10-CM

## 2020-01-08 LAB — POCT URINALYSIS DIPSTICK
Appearance: NEGATIVE
Bilirubin, UA: NEGATIVE
Blood, UA: NEGATIVE
Glucose, UA: NEGATIVE
Ketones, UA: NEGATIVE
Leukocytes, UA: NEGATIVE
Nitrite, UA: NEGATIVE
Odor: NEGATIVE
Protein, UA: NEGATIVE
Spec Grav, UA: 1.01 (ref 1.010–1.025)
Urobilinogen, UA: 0.2 E.U./dL
pH, UA: 7.5 (ref 5.0–8.0)

## 2020-01-08 NOTE — Progress Notes (Signed)
Subjective:    Patient ID: Tamara Myers, female    DOB: 1955/06/04, 64 y.o.   MRN: [id number]  HPI 64 year old Female seen for health maintenance exam and evaluation of medical issues.  She has a history of essential hypertension and fibrocystic breast disease.   Was diagnosed in 2020 with sarcoidosis. Is being followed by Dr. Valeta Harms. Was on prednisone for a number of months and this has been tapered by pulmonologist and will be discontinued soon.  No known drug allergies.  Open reduction internal fixation of right thumb July 2016 by Dr. Apolonio Schneiders. This was due to right thumb metacarpal shaft fracture.  No known drug allergies.  History of dysplastic nevus right leg treated by Dr. Kennis Carina surgeon. Atypical nevus removed from arm and a basal cell carcinoma removed in the past as well.  Past medical history: Became menopausal in 2000. 2 C-sections in the past. Fractured right fifth metacarpal. Fracture left fifth finger at age 46. History of right knee pain and is seen orthopedist in 2011 at which time she received an injection of Xylocaine and Aristospan. History of aspiration of right breast in May 2012.  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 Married. 2 adult children.  Patient does not smoke or consume alcohol.  Family history: Father with history of CABG, diabetes, BOOP syndrome, hyperlipidemia, hypertension died from respiratory failure. He had pulmonary fibrosis. Mother with history of Mnire's disease, macular degeneration and remote history of melanoma.  Sarcoidosis was discovered by allergist when she was referred there through this office for protracted cough. She had a CT scan during that allergy evaluation which was abnormal. At that time had been coughing for some 12 months and no improvement with Protonix and Zantac. ENT had prescribed gabapentin for cough. Had bronchoscopy in July 2020. Had mediastinal adenopathy on chest CT. Had transbronchial needle  aspiration biopsies. Cultures were obtained. No malignant cells identified. Biopsy showed granulomatous inflammation consistent with sarcoidosis. Cultures were negative for AFB and fungus. Cough has improved.   Review of Systems pulmonary symptoms are stable     Objective:   Physical Exam Vitals reviewed.  Constitutional:      Appearance: Normal appearance.  HENT:     Head: Normocephalic.     Right Ear: Tympanic membrane normal.     Left Ear: Tympanic membrane normal.     Nose: Nose normal.     Mouth/Throat:     Mouth: Mucous membranes are moist.  Eyes:     General:        Right eye: No discharge.        Left eye: No discharge.     Extraocular Movements: Extraocular movements intact.     Pupils: Pupils are equal, round, and reactive to light.  Neck:     Vascular: No carotid bruit.     Comments: No thyromegaly Cardiovascular:     Rate and Rhythm: Normal rate and regular rhythm.     Heart sounds: Normal heart sounds. No murmur heard.   Pulmonary:     Effort: Pulmonary effort is normal.     Breath sounds: Normal breath sounds. No wheezing.  Musculoskeletal:     Right lower leg: No edema.     Left lower leg: No edema.     Comments: Pain in lower thoracic spine area  Lymphadenopathy:     Cervical: No cervical adenopathy.  Skin:    General: Skin is warm and dry.  Neurological:     General:  No focal deficit present.     Mental Status: She is alert.  Psychiatric:        Mood and Affect: Mood normal.        Behavior: Behavior normal.           Assessment & Plan:   Elevated serum creatinine at 1.25- repeat when well hydrated- repeat creatinine Sept 16 was improved at 1.12 and will be followed. May have some mild CKD from HTN  Prolonged steroid therapy for sarcoidosis- have bone density study     New Thoracic compression fractures - has acute back pain with vertebral body fractures T9,T10,T11.  Order bone density study  Sarcoidosis-followed by pulmonary in tapering  off of prednisone  Essential HTN-continue current regimen of Maxide 25 and amlodipine  Hyperlipidemia treated with statin medication stable  History of GE reflux treated with PPI  Health maintenance-flu vaccine given today  Plan: Has appointment in November to see pulmonologist.  Will have bone density study due to new compression fractures thoracic spine.  Consider Prolia therapy.  We will see if insurance will approve.

## 2020-01-15 ENCOUNTER — Other Ambulatory Visit: Payer: Self-pay

## 2020-01-15 ENCOUNTER — Ambulatory Visit (INDEPENDENT_AMBULATORY_CARE_PROVIDER_SITE_OTHER): Payer: BC Managed Care – PPO | Admitting: Internal Medicine

## 2020-01-15 VITALS — Ht 62.5 in | Wt 136.0 lb

## 2020-01-15 DIAGNOSIS — R7989 Other specified abnormal findings of blood chemistry: Secondary | ICD-10-CM

## 2020-01-15 DIAGNOSIS — Z23 Encounter for immunization: Secondary | ICD-10-CM | POA: Diagnosis not present

## 2020-01-15 NOTE — Progress Notes (Signed)
Tdap given by CMA 

## 2020-01-15 NOTE — Patient Instructions (Signed)
Patient received a TDAP, R deltoid.

## 2020-01-16 LAB — CREATININE, SERUM: Creat: 1.12 mg/dL — ABNORMAL HIGH (ref 0.50–0.99)

## 2020-01-18 NOTE — Progress Notes (Signed)
Cardiology Office Note:    Date:  01/22/2020   ID:  Tamara Myers, DOB [birth year], MRN [id number]  PCP:  Elby Showers, MD  Cardiologist:  No primary care provider on file.  Electrophysiologist:  None   Referring MD: Elby Showers, MD   Chief Complaint  Patient presents with  . Coronary Artery Disease    History of Present Illness:    Tamara Myers is a 64 y.o. female with a hx of pulmonary sarcoid, asthma who presents for follow-up.  She was referred by Dr. Renold Genta for evaluation of CAD, initially seen on 07/15/2019.  She had underwent high-resolution CT chest for evaluation of pulmonary sarcoid.  Noted to have LAD calcifications.  She reports that she has occasional chest tightness.  Occurs 1-2 times per month.  Can occur at rest, but has noted chest tightness while she does yoga.  Typically will last for about 5 minutes and resolves.  She walks 3 miles about 2-3 times per week, denies any chest pain with walking.  She does have chronic dyspnea that she attributes to her pulmonary sarcoid.  On prednisone and methotrexate for sarcoid.  Diagnosed 1 year ago.  Reports dyspnea controlled on prednisone.  She does report that she had been having palpitations several years ago, but she stopped drinking caffeine and it significantly improved.  She now has rare episodes where she feels like heart is racing, lasts for couple minutes and resolves.  She occasionally has dizzy spells, but reports only occurs a few times per year.  Never smoked.  Father had CABG in 48s.  Son had WPW, treated with ablation.     Echocardiogram on 07/29/2019 showed LVEF 55 to 60% with focal basal inferior hypokinesis, normal RV function, no significant valvular disease.  Coronary CTA on 08/19/2019 showed nonobstructive CAD in the mid LAD (calcium score 61, 80th percentile).  Cardiac MRI on 11/28/2019 showed no evidence of cardiac sarcoid, LVEF 70%, RVEF 65%, no LGE.  Since last clinic visit, she reports that she has been  doing okay.  Recently has had issues with back pain and found to have multiple compression fractures.  She has not been exercising due to the compression fractures.  She denies any chest pain, dyspnea, palpitations, lightheadedness, syncope, or lower extremity edema.     Past Medical History:  Diagnosis Date  . Cancer ([place])    basal cell carcinoma  . Complication of anesthesia    slow to wake up  . Dyspnea    on exertion  . GERD (gastroesophageal reflux disease)   . Hypertension   . Melanoma ([place]) 2018   left upper arm  . Moderate persistent asthma without complication 42/11/7679  . PONV (postoperative nausea and vomiting)   . Post menopausal syndrome     Past Surgical History:  Procedure Laterality Date  . CESAREAN SECTION    . OPEN REDUCTION INTERNAL FIXATION (ORIF) METACARPAL Right 11/24/2014   Procedure: OPEN REDUCTION INTERNAL FIXATION (ORIF) RIGHT THUMB;  Surgeon: Iran Planas, MD;  Location: [facility];  Service: Orthopedics;  Laterality: Right;  . thumb surgery Right 2014  . VIDEO BRONCHOSCOPY N/A 11/20/2018   Procedure: Video Bronchoscopy With Fluoro;  Surgeon: Garner Nash, DO;  Location: [facility];  Service: Thoracic;  Laterality: N/A;  . VIDEO BRONCHOSCOPY WITH ENDOBRONCHIAL ULTRASOUND N/A 11/20/2018   Procedure: VIDEO BRONCHOSCOPY WITH ENDOBRONCHIAL ULTRASOUND;  Surgeon: Garner Nash, DO;  Location: [facility];  Service: Thoracic;  Laterality: N/A;    Current Medications:  Current Meds  Medication Sig  . albuterol (PROVENTIL HFA;VENTOLIN HFA) 108 (90 Base) MCG/ACT inhaler Inhale 2 puffs into the lungs every 6 (six) hours as needed for wheezing or shortness of breath.  Marland Kitchen amLODipine (NORVASC) 5 MG tablet TAKE 1 TABLET BY MOUTH EVERY DAY  . cholecalciferol (VITAMIN D3) 10 MCG (400 UNIT) TABS tablet Take 400 Units by mouth 2 (two) times daily.  . Fluticasone-Salmeterol (ADVAIR DISKUS) 100-50 MCG/DOSE AEPB Inhale 1 puff into the lungs 2 (two) times daily.  . folic acid (FOLVITE)  1 MG tablet Take 1 tablet (1 mg total) by mouth daily.  Marland Kitchen HYDROcodone-acetaminophen (NORCO) 5-325 MG tablet Take 1 tablet by mouth every 8 (eight) hours as needed for moderate pain.  . methotrexate (RHEUMATREX) 10 MG tablet Take 10 mg by mouth once a week. Caution: Chemotherapy. Protect from light.  . pantoprazole (PROTONIX) 40 MG tablet Take 1 tablet (40 mg total) by mouth daily.  . predniSONE (DELTASONE) 5 MG tablet Take 1 tab daily X4 weeks Then take 1/2 tab daily X4 weeks Then stop  . rosuvastatin (CRESTOR) 10 MG tablet Take 1 tablet (10 mg total) by mouth daily at 6 PM.  . sulfamethoxazole-trimethoprim (BACTRIM DS) 800-160 MG tablet Take 1 tablet by mouth 3 (three) times a week.  . triamterene-hydrochlorothiazide (MAXZIDE-25) 37.5-25 MG tablet TAKE 1 TABLET BY MOUTH EVERY DAY  . Vitamin D, Ergocalciferol, (DRISDOL) 1.25 MG (50000 UNIT) CAPS capsule TAKE 1 CAPSULE (50,000 UNITS TOTAL) BY MOUTH EVERY 7 (SEVEN) DAYS.     Allergies:   Patient has no known allergies.   Social History   Socioeconomic History  . Marital status: Married    Spouse name: Not on file  . Number of children: Not on file  . Years of education: Not on file  . Highest education level: Not on file  Occupational History  . Not on file  Tobacco Use  . Smoking status: Never Smoker  . Smokeless tobacco: Never Used  Vaping Use  . Vaping Use: Never used  Substance and Sexual Activity  . Alcohol use: No  . Drug use: No  . Sexual activity: Not on file  Other Topics Concern  . Not on file  Social History Narrative  . Not on file   Social Determinants of Health   Financial Resource Strain:   . Difficulty of Paying Living Expenses: Not on file  Food Insecurity:   . Worried About Charity fundraiser in the Last Year: Not on file  . Ran Out of Food in the Last Year: Not on file  Transportation Needs:   . Lack of Transportation (Medical): Not on file  . Lack of Transportation (Non-Medical): Not on file  Physical  Activity:   . Days of Exercise per Week: Not on file  . Minutes of Exercise per Session: Not on file  Stress:   . Feeling of Stress : Not on file  Social Connections:   . Frequency of Communication with Friends and Family: Not on file  . Frequency of Social Gatherings with Friends and Family: Not on file  . Attends Religious Services: Not on file  . Active Member of Clubs or Organizations: Not on file  . Attends Archivist Meetings: Not on file  . Marital Status: Not on file     Family History: The patient's family history includes Asthma in her daughter; Diabetes in her father; Heart disease in her father; Hyperlipidemia in her father; Hypertension in her father; Pulmonary fibrosis in  her father. There is no history of Breast cancer, Allergic rhinitis, Eczema, Urticaria, or Angioedema.  ROS:   Please see the history of present illness.     All other systems reviewed and are negative.  EKGs/Labs/Other Studies Reviewed:    The following studies were reviewed today:   EKG:  EKG is not ordered today.  The ekg ordered most recently demonstrates normal sinus rhythm, rate 85, no ST/T abnormalities  Recent Labs: 01/06/2020: ALT 27; BUN 15; Hemoglobin 13.4; Platelets 265; Potassium 3.6; Sodium 142; TSH 1.46 01/15/2020: Creat 1.12  Recent Lipid Panel    Component Value Date/Time   CHOL 178 01/06/2020 0908   TRIG 103 01/06/2020 0908   HDL 99 01/06/2020 0908   CHOLHDL 1.8 01/06/2020 0908   VLDL 15 12/19/2016 0923   LDLCALC 60 01/06/2020 0908    Physical Exam:    VS:  BP 116/78   Pulse 76   Ht 5' 2.5" (1.588 m)   Wt 139 lb (63 kg)   SpO2 98%   BMI 25.02 kg/m     Wt Readings from Last 3 Encounters:  01/22/20 139 lb (63 kg)  01/15/20 136 lb (61.7 kg)  01/08/20 136 lb (61.7 kg)     GEN:  in no acute distress HEENT: Normal NECK: No JVD LYMPHATICS: No lymphadenopathy CARDIAC: RRR, no murmurs, rubs, gallops RESPIRATORY:  Clear to auscultation without rales,  wheezing or rhonchi  ABDOMEN: Soft, non-tender, non-distended MUSCULOSKELETAL:  No edema; No deformity  SKIN: Warm and dry NEUROLOGIC:  Alert and oriented x 3 PSYCHIATRIC:  Normal affect   ASSESSMENT:    1. Coronary artery disease involving native coronary artery of native heart without angina pectoris   2. Sarcoidosis   3. Palpitations   4. Essential hypertension   5. Hyperlipidemia, unspecified hyperlipidemia type    PLAN:    Coronary artery disease: Coronary CTA on 08/19/2019 showed nonobstructive CAD in the mid LAD (calcium score 61, 80th percentile).  Continue Crestor 10 mg daily, LDL at goal less than 70  Pulmonary sarcoidosis: On prednisone and methotrexate.  Cardiac MRI on 11/28/2019 showed no evidence of cardiac sarcoid, LVEF 70%, RVEF 65%, no LGE.  Palpitations: Rare episodes where she feels like heart is racing, will consider cardiac monitor if occurring more frequently.  None recently.  Hyperlipidemia: LDL 60 on 01/06/2020, continue Crestor 10 mg daily  Hypertension: On amlodipine 5 mg daily and triamterne-HCTZ.  37.5-25 mg.  Appears controlled  RTC in 1 year  Medication Adjustments/Labs and Tests Ordered: Current medicines are reviewed at length with the patient today.  Concerns regarding medicines are outlined above.  No orders of the defined types were placed in this encounter.  No orders of the defined types were placed in this encounter.   Patient Instructions  Medication Instructions:  Your physician recommends that you continue on your current medications as directed. Please refer to the Current Medication list given to you today.  *If you need a refill on your cardiac medications before your next appointment, please call your pharmacy*  Follow-Up: At [facility], you and your health needs are our priority.  As part of our continuing mission to provide you with exceptional heart care, we have created designated Provider Care Teams.  These Care Teams  include your primary Cardiologist (physician) and Advanced Practice Providers (APPs -  Physician Assistants and Nurse Practitioners) who all work together to provide you with the care you need, when you need it.  We recommend signing up for the  patient portal called "MyChart".  Sign up information is provided on this After Visit Summary.  MyChart is used to connect with patients for Virtual Visits (Telemedicine).  Patients are able to view lab/test results, encounter notes, upcoming appointments, etc.  Non-urgent messages can be sent to your provider as well.   To learn more about what you can do with MyChart, go to [email or web address].    Your next appointment:   12 month(s)  The format for your next appointment:   In Person  Provider:   Oswaldo Milian, MD       Signed, Donato Heinz, MD  01/22/2020 8:25 AM    [place]

## 2020-01-22 ENCOUNTER — Other Ambulatory Visit: Payer: Self-pay

## 2020-01-22 ENCOUNTER — Ambulatory Visit: Payer: BC Managed Care – PPO | Admitting: Cardiology

## 2020-01-22 ENCOUNTER — Encounter: Payer: Self-pay | Admitting: Cardiology

## 2020-01-22 VITALS — BP 116/78 | HR 76 | Ht 62.5 in | Wt 139.0 lb

## 2020-01-22 DIAGNOSIS — R002 Palpitations: Secondary | ICD-10-CM | POA: Diagnosis not present

## 2020-01-22 DIAGNOSIS — I10 Essential (primary) hypertension: Secondary | ICD-10-CM

## 2020-01-22 DIAGNOSIS — I2510 Atherosclerotic heart disease of native coronary artery without angina pectoris: Secondary | ICD-10-CM | POA: Diagnosis not present

## 2020-01-22 DIAGNOSIS — D869 Sarcoidosis, unspecified: Secondary | ICD-10-CM | POA: Diagnosis not present

## 2020-01-22 DIAGNOSIS — E785 Hyperlipidemia, unspecified: Secondary | ICD-10-CM

## 2020-01-22 NOTE — Patient Instructions (Signed)

## 2020-01-24 ENCOUNTER — Other Ambulatory Visit: Payer: Self-pay | Admitting: Pulmonary Disease

## 2020-01-27 ENCOUNTER — Ambulatory Visit
Admission: RE | Admit: 2020-01-27 | Discharge: 2020-01-27 | Disposition: A | Discharge status: Home / Self Care | Payer: BC Managed Care – PPO | Source: Ambulatory Visit | Attending: Internal Medicine | Admitting: Internal Medicine

## 2020-01-27 ENCOUNTER — Other Ambulatory Visit: Payer: Self-pay

## 2020-01-27 DIAGNOSIS — M85852 Other specified disorders of bone density and structure, left thigh: Secondary | ICD-10-CM | POA: Diagnosis not present

## 2020-01-27 DIAGNOSIS — M810 Age-related osteoporosis without current pathological fracture: Secondary | ICD-10-CM | POA: Diagnosis not present

## 2020-01-27 DIAGNOSIS — Z780 Asymptomatic menopausal state: Secondary | ICD-10-CM | POA: Diagnosis not present

## 2020-01-28 NOTE — Patient Instructions (Addendum)
We will see if we can get Prolia approved for thoracic compression fractures.  Follow-up with pulmonary in November.  Continue other medications as previously prescribed.  Have Covid booster soon when available.

## 2020-02-12 ENCOUNTER — Encounter: Payer: Self-pay | Admitting: Internal Medicine

## 2020-02-12 ENCOUNTER — Other Ambulatory Visit: Payer: Self-pay

## 2020-02-12 ENCOUNTER — Ambulatory Visit (INDEPENDENT_AMBULATORY_CARE_PROVIDER_SITE_OTHER): Payer: BC Managed Care – PPO | Admitting: Internal Medicine

## 2020-02-12 VITALS — BP 110/70 | HR 76 | Ht 62.5 in | Wt 136.0 lb

## 2020-02-12 DIAGNOSIS — D860 Sarcoidosis of lung: Secondary | ICD-10-CM

## 2020-02-12 DIAGNOSIS — Z92241 Personal history of systemic steroid therapy: Secondary | ICD-10-CM

## 2020-02-12 DIAGNOSIS — S22070D Wedge compression fracture of T9-T10 vertebra, subsequent encounter for fracture with routine healing: Secondary | ICD-10-CM | POA: Diagnosis not present

## 2020-02-12 DIAGNOSIS — Z85828 Personal history of other malignant neoplasm of skin: Secondary | ICD-10-CM | POA: Diagnosis not present

## 2020-02-12 DIAGNOSIS — M8080XD Other osteoporosis with current pathological fracture, unspecified site, subsequent encounter for fracture with routine healing: Secondary | ICD-10-CM

## 2020-02-12 DIAGNOSIS — L570 Actinic keratosis: Secondary | ICD-10-CM | POA: Diagnosis not present

## 2020-02-12 DIAGNOSIS — D2261 Melanocytic nevi of right upper limb, including shoulder: Secondary | ICD-10-CM | POA: Diagnosis not present

## 2020-02-12 DIAGNOSIS — D2262 Melanocytic nevi of left upper limb, including shoulder: Secondary | ICD-10-CM | POA: Diagnosis not present

## 2020-02-12 DIAGNOSIS — D225 Melanocytic nevi of trunk: Secondary | ICD-10-CM | POA: Diagnosis not present

## 2020-02-12 DIAGNOSIS — D485 Neoplasm of uncertain behavior of skin: Secondary | ICD-10-CM | POA: Diagnosis not present

## 2020-02-12 DIAGNOSIS — S22080D Wedge compression fracture of T11-T12 vertebra, subsequent encounter for fracture with routine healing: Secondary | ICD-10-CM

## 2020-02-12 DIAGNOSIS — Z85820 Personal history of malignant melanoma of skin: Secondary | ICD-10-CM | POA: Diagnosis not present

## 2020-02-12 MED ORDER — VITAMIN D (ERGOCALCIFEROL) 1.25 MG (50000 UNIT) PO CAPS
50000.0000 [IU] | ORAL_CAPSULE | ORAL | 3 refills | Status: DC
Start: 2020-02-12 — End: 2020-08-02

## 2020-02-12 NOTE — Progress Notes (Signed)
° °  Subjective:    Patient ID: Tamara Myers, female    DOB: [birth year], 64 y.o.   MRN: [id number]  HPI 64 year old Female seen acutely on September 8 with  backack pain.  When she attempted to get out of bed that morning, she had rather severe low back pain in the lower thoracic spine area.  Was eventually able to get out of bed.  She has a history of sarcoidosis and is being followed by [facility].  Currently on prednisone 5 mg daily but took higher doses when first diagnosed with sarcoidosis by allergist whom she was seen for asthma in March 2020.  At that time, she had a cough for a number of months.  Subsequently was placed on methotrexate by Dr. Valeta Harms, pulmonologist.  When seen on September 8, a left anterior fifth rib fracture could not be excluded.  She had lower thoracic vertebral body fractures at T9-T10 and T11 that were thought to possibly be new.  I recommended that she have bone density study.  No previous bone density study on file.  She is now here to discuss bone density study results.  Back pain has improved considerably.  She was treated sparingly with pain medication Norco 5/325 every 8 hours as needed.  She remains on methotrexate and prednisone 5 mg daily.  She had bone density study on September 28 and lowest T score was -2.7 in the AP spine L1-L2.  This is consistent with osteoporosis.  She would be a candidate for Prolia at this point and we will need to get prior authorization for that.    Review of Systems no new complaints     Objective:   Physical Exam  Blood pressure 110/70 pulse 76 pulse oximetry 97% weight 136 pounds height 5 feet 2.5 inches BMI 24.48 her chest is clear to auscultation.  Cardiac exam regular rate and rhythm.  No lower extremity edema.  Overall, she is able to have near normal mobility at this point in time.      Assessment & Plan:  Sarcoidosis requiring treatment with steroid therapy  Thoracic compression fractures  Osteoporosis  likely due to prolonged steroid therapy and estrogen deficiency and thin white Female  Plan: She is a candidate for Prolia and is agreeable to trying this.  We will need to get prior authorization.  She has follow-up with Dr. Valeta Harms in November.

## 2020-02-16 ENCOUNTER — Other Ambulatory Visit: Payer: Self-pay | Admitting: Internal Medicine

## 2020-02-16 MED ORDER — FLUTICASONE-SALMETEROL 100-50 MCG/DOSE IN AEPB: 1.0000 | INHALATION_SPRAY | Freq: Two times a day (BID) | RESPIRATORY_TRACT | 3 refills | Status: DC

## 2020-02-18 NOTE — Patient Instructions (Addendum)
We are glad your back pain has improved.  You have osteoporosis likely due to a combination of menopause, being a thin white female and history of steroid therapy.  We will try to get prior authorization for Prolia therapy.  Please follow-up with Dr. Valeta Harms in November.  Health maintenance exam is due here in September 2022.

## 2020-02-19 DIAGNOSIS — L988 Other specified disorders of the skin and subcutaneous tissue: Secondary | ICD-10-CM | POA: Diagnosis not present

## 2020-02-19 DIAGNOSIS — D485 Neoplasm of uncertain behavior of skin: Secondary | ICD-10-CM | POA: Diagnosis not present

## 2020-03-02 MED ORDER — FOLIC ACID 1 MG PO TABS
1.0000 mg | ORAL_TABLET | Freq: Every day | ORAL | 0 refills | Status: DC
Start: 2020-03-02 — End: 2020-05-27

## 2020-03-08 ENCOUNTER — Telehealth: Payer: Self-pay | Admitting: Internal Medicine

## 2020-03-08 NOTE — Telephone Encounter (Signed)
Done

## 2020-03-08 NOTE — Telephone Encounter (Signed)
Called and spoke with patient about [employer] denial of her Prolia Injection, that their recomondation was [place] and yours was a referral to Endocrinology in hopes that they may be able to get the Prolia approved. She would like to be referred to Endo.

## 2020-03-08 NOTE — Telephone Encounter (Signed)
Please put in referral to Endocrinology for compression fractures and osteoporosis with Hx of sarcoidosis

## 2020-03-11 ENCOUNTER — Other Ambulatory Visit: Payer: BC Managed Care – PPO

## 2020-03-11 ENCOUNTER — Telehealth: Payer: Self-pay | Admitting: Pulmonary Disease

## 2020-03-11 NOTE — Telephone Encounter (Signed)
Patient came into office today stating she needed lab work.  No notes or orders from last OV about lab work. There are not telephone notes regarding this.   Dr. Valeta Harms she has an appt with you and PFTs on 03/17/20, please advise if she needs labs before this visit.

## 2020-03-11 NOTE — Telephone Encounter (Signed)
PCCM: I believe she is off her prednisone at this point. We can check her kidney function at the next office visit. No need for her to come back for labs prior to the visit.  Thanks Garner Nash, DO [facility] 03/11/2020 10:41 AM

## 2020-03-12 NOTE — Telephone Encounter (Signed)
Called and spoke with patient Let her know Dr.Icard's recommendations  Patient voiced understanding Nothing further needed at this time.

## 2020-03-17 ENCOUNTER — Other Ambulatory Visit: Payer: Self-pay

## 2020-03-17 ENCOUNTER — Ambulatory Visit: Payer: BC Managed Care – PPO | Admitting: Pulmonary Disease

## 2020-03-17 ENCOUNTER — Encounter: Payer: BC Managed Care – PPO | Admitting: Pulmonary Disease

## 2020-03-17 ENCOUNTER — Ambulatory Visit (INDEPENDENT_AMBULATORY_CARE_PROVIDER_SITE_OTHER): Payer: BC Managed Care – PPO | Admitting: Pulmonary Disease

## 2020-03-17 ENCOUNTER — Encounter: Payer: Self-pay | Admitting: Pulmonary Disease

## 2020-03-17 VITALS — BP 118/72 | HR 83 | Temp 97.2°F | Ht 63.5 in | Wt 136.0 lb

## 2020-03-17 DIAGNOSIS — Z79899 Other long term (current) drug therapy: Secondary | ICD-10-CM | POA: Diagnosis not present

## 2020-03-17 DIAGNOSIS — Z79631 Long term (current) use of antimetabolite agent: Secondary | ICD-10-CM

## 2020-03-17 DIAGNOSIS — Z5181 Encounter for therapeutic drug level monitoring: Secondary | ICD-10-CM | POA: Diagnosis not present

## 2020-03-17 DIAGNOSIS — R059 Cough, unspecified: Secondary | ICD-10-CM

## 2020-03-17 DIAGNOSIS — D869 Sarcoidosis, unspecified: Secondary | ICD-10-CM

## 2020-03-17 DIAGNOSIS — J849 Interstitial pulmonary disease, unspecified: Secondary | ICD-10-CM

## 2020-03-17 DIAGNOSIS — J4540 Moderate persistent asthma, uncomplicated: Secondary | ICD-10-CM

## 2020-03-17 LAB — PULMONARY FUNCTION TEST
DL/VA % pred: 119 %
DL/VA: 5.03 ml/min/mmHg/L
DLCO cor % pred: 101 %
DLCO cor: 20.04 ml/min/mmHg
DLCO unc % pred: 101 %
DLCO unc: 20.04 ml/min/mmHg
FEF 25-75 Post: 1.68 L/sec
FEF 25-75 Pre: 1 L/sec
FEF2575-%Change-Post: 67 %
FEF2575-%Pred-Post: 76 %
FEF2575-%Pred-Pre: 45 %
FEV1-%Change-Post: 19 %
FEV1-%Pred-Post: 80 %
FEV1-%Pred-Pre: 67 %
FEV1-Post: 1.96 L
FEV1-Pre: 1.64 L
FEV1FVC-%Change-Post: 18 %
FEV1FVC-%Pred-Pre: 84 %
FEV6-%Change-Post: 0 %
FEV6-%Pred-Post: 83 %
FEV6-%Pred-Pre: 82 %
FEV6-Post: 2.53 L
FEV6-Pre: 2.51 L
FEV6FVC-%Pred-Post: 103 %
FEV6FVC-%Pred-Pre: 103 %
FVC-%Change-Post: 0 %
FVC-%Pred-Post: 79 %
FVC-%Pred-Pre: 79 %
FVC-Post: 2.53 L
FVC-Pre: 2.51 L
Post FEV1/FVC ratio: 77 %
Post FEV6/FVC ratio: 100 %
Pre FEV1/FVC ratio: 65 %
Pre FEV6/FVC Ratio: 100 %
RV % pred: 83 %
RV: 1.69 L
TLC % pred: 87 %
TLC: 4.34 L

## 2020-03-17 NOTE — Patient Instructions (Addendum)
Thank you for visiting Dr. Valeta Harms at [facility]. Today we recommend the following:  Orders Placed This Encounter  Procedures  . CT CHEST HIGH RESOLUTION  . CBC w/Diff  . Comp Met (CMET)   Return in about 4 months (around 07/15/2020) for Dr. Valeta Harms or Wyn Quaker, NP .    Please do your part to reduce the spread of COVID-19.

## 2020-03-17 NOTE — Progress Notes (Signed)
Synopsis: Referred in March 2020 for abnormal CT of the chest by Elby Showers, MD  Subjective:   PATIENT ID: Tamara Myers GENDER: female DOB: 1956/03/28, MRN: [id number]  Chief Complaint  Patient presents with  . Follow-up    PFT this morning.      Patient has had complaints of progressive cough for the past year.  She was recently placed on a PPI for treatment of reflux.  She was ultimately referred to allergy.  She saw Dr. Maudie Mercury which evaluated for her cough.  She had a chest x-ray which revealed interstitial markings.  She ultimately had a noncontrast CT of the chest which revealed perilymphatic micro-nodularity consistent with pulmonary sarcoidosis with small areas of bilateral hilar adenopathy and a right-sided 1.4 cm hilar lymph node.  She had a dominant right-sided right lower lobe nodule that measured 1 cm in size also likely secondary to sarcoid however recommending a 3 to 58-monthfollow-up.  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  Father died of pulmonary fibrosis.  Patient has not had any eye or skin symptoms.  OV 03/12/2019:Patient underwent bronchoscopy November 20, 2018.  Bronchoscopy tissue biopsy revealed granulomatous inflammation.  Follow-up visit was via telephone.  In August 2020.  She was then placed on a 327-monthaper of prednisone with PCP prophylaxis of Bactrim.  Patient here today for follow-up after recent diagnosis of sarcoidosis.  Overall patient doing well today.  She recently completed her 3-65-monthper of prednisone.  Over the past 2 weeks she has noticed increase in her cough.  Today reviewed her CT scan which was completed getting of week which revealed improvement of the nodularity and parenchymal changes consistent with sarcoid however they are still present.  She still has dry cough.  OV 04/17/2019:  Patient here today for follow-up after starting methotrexate and restarting her oral prednisone for sarcoidosis.  Doing well today.  Respiratory status is much improved as to previous.  She does not have significant cough anymore.  She feels a little bit better when it comes to her dyspnea on exertion but is still about the same.  Occasionally has cough but not much.  She has been faithful with taking her medication.  She has uptitrated methotrexate to 10 mg daily.  She is planned for labs today.  She overall denies hemoptysis weight loss, fevers chills night sweats.  OV 06/25/2019: Respiratory symptoms stable at this time.  She does have some exertional dyspnea when she does yoga in the evenings.  Otherwise she is able to complete most activities of daily living.  Still trying to stay social distance.  She has noticed some puffiness in her cheeks that she feels may be related to fluid retention from her steroids.  She has noticed easy bruising as well.  Also right eye had a subconjunctival hemorrhage which was new that occurred spontaneously.  Patient denies hemoptysis fevers chills night sweats weight loss.  OV 12/04/2019: Patient here today for follow-up of sarcoidosis. Otherwise doing well with no symptoms at this time. Taking 10 mg of prednisone today and 10 mg weekly of methotrexate. She has noticed some weight gain. She does feel like her face is a little bit more puffy. We did review her CT imaging that was completed and March 2021 which showed improvement. She has had 2 subsequent follow-ups with BriWyn Quaker  NP since last time I saw her in the office.   OV 03/17/2020: Here today for follow-up of sarcoidosis and immunosuppression.  Currently managed with prednisone and methotrexate.  After last office visit and plans were successfully tapering from prednisone.  Overall patient doing very well today from a respiratory standpoint.  She is slowly recovering from her thoracic compression fractures.  She is  off prednisone at this time maintenance dose of methotrexate.  Patient did have pulmonary function test that were completed prior to today's office visit which revealed postbronchodilator normal ratio, FVC of 79, FEV1 80%, TLC 87% predicted, DLCO 101%.  Stable in comparison.  Results reviewed today and interpreted in the office.  With patient.    Past Medical History:  Diagnosis Date  . Cancer ([place])    basal cell carcinoma  . Complication of anesthesia    slow to wake up  . Dyspnea    on exertion  . GERD (gastroesophageal reflux disease)   . Hypertension   . Melanoma ([place]) 2018   left upper arm  . Moderate persistent asthma without complication 44/0/3474  . PONV (postoperative nausea and vomiting)   . Post menopausal syndrome      Family History  Problem Relation Age of Onset  . Diabetes Father   . Heart disease Father   . Hyperlipidemia Father   . Hypertension Father   . Pulmonary fibrosis Father   . Asthma Daughter   . Breast cancer Neg Hx   . Allergic rhinitis Neg Hx   . Eczema Neg Hx   . Urticaria Neg Hx   . Angioedema Neg Hx      Past Surgical History:  Procedure Laterality Date  . CESAREAN SECTION    . OPEN REDUCTION INTERNAL FIXATION (ORIF) METACARPAL Right 11/24/2014   Procedure: OPEN REDUCTION INTERNAL FIXATION (ORIF) RIGHT THUMB;  Surgeon: Iran Planas, MD;  Location: [facility];  Service: Orthopedics;  Laterality: Right;  . thumb surgery Right 2014  . VIDEO BRONCHOSCOPY N/A 11/20/2018   Procedure: Video Bronchoscopy With Fluoro;  Surgeon: Garner Nash, DO;  Location: [facility];  Service: Thoracic;  Laterality: N/A;  . VIDEO BRONCHOSCOPY WITH ENDOBRONCHIAL ULTRASOUND N/A 11/20/2018   Procedure: VIDEO BRONCHOSCOPY WITH ENDOBRONCHIAL ULTRASOUND;  Surgeon: Garner Nash, DO;  [facility];  Service: Thoracic;  Laterality: N/A;    Social History   Socioeconomic History  . Marital status: Married    Spouse name: Not on file  . Number of children: Not on file   . Years of education: Not on file  . Highest education level: Not on file  Occupational History  . Not on file  Tobacco Use  . Smoking status: Never Smoker  . Smokeless tobacco: Never Used  Vaping Use  . Vaping Use: Never used  Substance and Sexual Activity  . Alcohol use: No  . Drug use: No  . Sexual activity: Not on file  Other Topics Concern  . Not on file  Social History Narrative  . Not on file   Social Determinants of Health   Financial Resource Strain:   . Difficulty of Paying Living Expenses: Not on file  Food Insecurity:   . Worried About Charity fundraiser in the Last Year: Not on file  . Ran Out of Food in the Last Year: Not on file  Transportation Needs:   . Lack of Transportation (Medical): Not on file  . Lack of Transportation (Non-Medical): Not on file  Physical Activity:   .  Days of Exercise per Week: Not on file  . Minutes of Exercise per Session: Not on file  Stress:   . Feeling of Stress : Not on file  Social Connections:   . Frequency of Communication with Friends and Family: Not on file  . Frequency of Social Gatherings with Friends and Family: Not on file  . Attends Religious Services: Not on file  . Active Member of Clubs or Organizations: Not on file  . Attends Archivist Meetings: Not on file  . Marital Status: Not on file  Intimate Partner Violence:   . Fear of Current or Ex-Partner: Not on file  . Emotionally Abused: Not on file  . Physically Abused: Not on file  . Sexually Abused: Not on file     No Known Allergies   Outpatient Medications Prior to Visit  Medication Sig Dispense Refill  . albuterol (PROVENTIL HFA;VENTOLIN HFA) 108 (90 Base) MCG/ACT inhaler Inhale 2 puffs into the lungs every 6 (six) hours as needed for wheezing or shortness of breath. 18 g 1  . amLODipine (NORVASC) 5 MG tablet TAKE 1 TABLET BY MOUTH EVERY DAY 90 tablet 3  . cholecalciferol (VITAMIN D3) 10 MCG (400 UNIT) TABS tablet Take 400 Units by mouth 2  (two) times daily.    . Fluticasone-Salmeterol (ADVAIR DISKUS) 100-50 MCG/DOSE AEPB Inhale 1 puff into the lungs 2 (two) times daily. 60 each 3  . folic acid (FOLVITE) 1 MG tablet Take 1 tablet (1 mg total) by mouth daily. 90 tablet 0  . methotrexate (RHEUMATREX) 10 MG tablet Take 10 mg by mouth once a week. Caution: Chemotherapy. Protect from light.    . pantoprazole (PROTONIX) 40 MG tablet Take 1 tablet (40 mg total) by mouth daily. 90 tablet 3  . rosuvastatin (CRESTOR) 10 MG tablet Take 1 tablet (10 mg total) by mouth daily at 6 PM. 90 tablet 3  . triamterene-hydrochlorothiazide (MAXZIDE-25) 37.5-25 MG tablet TAKE 1 TABLET BY MOUTH EVERY DAY 90 tablet 2  . Vitamin D, Ergocalciferol, (DRISDOL) 1.25 MG (50000 UNIT) CAPS capsule Take 1 capsule (50,000 Units total) by mouth every 7 (seven) days. 12 capsule 3  . HYDROcodone-acetaminophen (NORCO) 5-325 MG tablet Take 1 tablet by mouth every 8 (eight) hours as needed for moderate pain. 15 tablet 0  . predniSONE (DELTASONE) 5 MG tablet Take 1 tab daily X4 weeks Then take 1/2 tab daily X4 weeks Then stop 42 tablet 0   No facility-administered medications prior to visit.    Review of Systems  Constitutional: Negative for chills, fever, malaise/fatigue and weight loss.  HENT: Negative for hearing loss, sore throat and tinnitus.   Eyes: Negative for blurred vision and double vision.  Respiratory: Positive for cough. Negative for hemoptysis, sputum production, shortness of breath, wheezing and stridor.   Cardiovascular: Negative for chest pain, palpitations, orthopnea, leg swelling and PND.  Gastrointestinal: Negative for abdominal pain, constipation, diarrhea, heartburn, nausea and vomiting.  Genitourinary: Negative for dysuria, hematuria and urgency.  Musculoskeletal: Negative for joint pain and myalgias.  Skin: Negative for itching and rash.  Neurological: Negative for dizziness, tingling, weakness and headaches.  Endo/Heme/Allergies: Negative for  environmental allergies. Does not bruise/bleed easily.  Psychiatric/Behavioral: Negative for depression. The patient is not nervous/anxious and does not have insomnia.   All other systems reviewed and are negative.    Objective:  Physical Exam Vitals reviewed.  Constitutional:      General: She is not in acute distress.    Appearance: She is  well-developed.  HENT:     Head: Normocephalic and atraumatic.     Mouth/Throat:     Pharynx: No oropharyngeal exudate.  Eyes:     Conjunctiva/sclera: Conjunctivae normal.     Pupils: Pupils are equal, round, and reactive to light.  Neck:     Vascular: No JVD.     Trachea: No tracheal deviation.     Comments: Loss of supraclavicular fat Cardiovascular:     Rate and Rhythm: Normal rate and regular rhythm.     Heart sounds: S1 normal and S2 normal.     Comments: Distant heart tones Pulmonary:     Effort: No tachypnea or accessory muscle usage.     Breath sounds: No stridor. Decreased breath sounds (throughout all lung fields) present. No wheezing, rhonchi or rales.  Abdominal:     General: Bowel sounds are normal. There is no distension.     Palpations: Abdomen is soft.     Tenderness: There is no abdominal tenderness.  Musculoskeletal:        General: Deformity (muscle wasting ) present.  Skin:    General: Skin is warm and dry.     Capillary Refill: Capillary refill takes less than 2 seconds.     Findings: No rash.  Neurological:     Mental Status: She is alert and oriented to person, place, and time.  Psychiatric:        Behavior: Behavior normal.      Vitals:   03/17/20 0950  BP: 118/72  Pulse: 83  Temp: (!) 97.2 F (36.2 C)  TempSrc: Tympanic  SpO2: 99%  Weight: 136 lb (61.7 kg)  Height: 5' 3.5" (1.613 m)   99% on RA BMI Readings from Last 3 Encounters:  03/17/20 23.71 kg/m  02/12/20 24.48 kg/m  01/22/20 25.02 kg/m   Wt Readings from Last 3 Encounters:  03/17/20 136 lb (61.7 kg)  02/12/20 136 lb (61.7 kg)   01/22/20 139 lb (63 kg)     CBC    Component Value Date/Time   WBC 6.3 01/06/2020 0908   RBC 4.01 01/06/2020 0908   HGB 13.4 01/06/2020 0908   HCT 39.1 01/06/2020 0908   PLT 265 01/06/2020 0908   MCV 97.5 01/06/2020 0908   MCV 88.1 07/29/2011 1126   MCH 33.4 (H) 01/06/2020 0908   MCHC 34.3 01/06/2020 0908   RDW 12.6 01/06/2020 0908   LYMPHSABS 277 (L) 01/06/2020 0908   MONOABS 0.3 10/23/2019 1509   EOSABS 13 (L) 01/06/2020 0908   BASOSABS 13 01/06/2020 0908    Chest Imaging: CT chest 07/03/2018: Evidence of peri-lymphatic bilateral micro-nodularity consistent with sarcoidosis, small mediastinal and hilar lymph nodes.  And a right lower lobe 1 cm nodule likely consistent with the sarcoid however recommending 3 to 25-monthfollow-up by radiology. The patient's images have been independently reviewed by me.    November 2020: CT chest with perilymphatic bilateral micronodularity which is improved in comparison to her previous imaging. The patient's images have been independently reviewed by me.    High-resolution CT scan of the chest 06/30/2019: Positive response to therapy some improvement of parenchymal changes. The patient's images have been independently reviewed by me.     Pulmonary Functions Testing Results: PFT Results Latest Ref Rng & Units 03/17/2020 11/06/2018 06/27/2018  FVC-Pre L 2.51 2.86 2.82  FVC-Predicted Pre % 79 88 86  FVC-Post L 2.53 2.92 2.82  FVC-Predicted Post % 79 89 86  Pre FEV1/FVC % % 65 74 65  Post FEV1/FCV % %  77 70 76  FEV1-Pre L 1.64 2.13 1.83  FEV1-Predicted Pre % 67 85 73  FEV1-Post L 1.96 2.04 2.13  DLCO uncorrected ml/min/mmHg 20.04 20.13 20.01  DLCO UNC% % 101 100 99  DLCO corrected ml/min/mmHg 20.04 - -  DLCO COR %Predicted % 101 - -  DLVA Predicted % 119 124 109  TLC L 4.34 4.74 4.57  TLC % Predicted % 87 93 90  RV % Predicted % 83 94 80     FeNO: None   Pathology:   Bronchoscopy July 2020: Lung, biopsy, right middle lobe -  BENIGN LUNG PARENCHYMA WITH GRANULOMATOUS INFLAMMATION. - THERE IS NO EVIDENCE OF MALIGNANCY.  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  Prior cardiac MRI negative for gadolinium enhancement.  Plan: Continue on maintenance dose of methotrexate 10 mg weekly. Pulmonary function test reviewed today and interpreted in the office. Patient had mild elevation in serum creatinine on last check with PCP. Repeat lab work at next office visit with [place]. Repeat noncontrasted high-resolution CT scan of the chest in March 2022 to look for any significant parenchymal change or progression. She can follow-up with me or Wyn Quaker in March 2022. Discussed briefly with patient regarding compression fractures.  I agree with referral to endocrinology from PCP to consider Prolia therapy.  We appreciate the help coordinating care.    Current Outpatient Medications:  .  albuterol (PROVENTIL HFA;VENTOLIN HFA) 108 (90 Base) MCG/ACT inhaler, Inhale 2 puffs into the lungs every 6 (six) hours as needed for wheezing or shortness of breath., Disp: 18 g, Rfl: 1 .  amLODipine (NORVASC) 5 MG tablet, TAKE 1 TABLET BY MOUTH EVERY DAY, Disp: 90 tablet, Rfl: 3 .  cholecalciferol (VITAMIN D3) 10 MCG (400 UNIT) TABS tablet, Take 400 Units by mouth 2 (two) times daily., Disp: , Rfl:  .  Fluticasone-Salmeterol (ADVAIR DISKUS) 100-50 MCG/DOSE AEPB, Inhale 1 puff into the lungs 2 (two) times daily., Disp: 60 each, Rfl: 3 .   folic acid (FOLVITE) 1 MG tablet, Take 1 tablet (1 mg total) by mouth daily., Disp: 90 tablet, Rfl: 0 .  methotrexate (RHEUMATREX) 10 MG tablet, Take 10 mg by mouth once a week. Caution: Chemotherapy. Protect from light., Disp: , Rfl:  .  pantoprazole (PROTONIX) 40 MG tablet, Take 1 tablet (40 mg total) by mouth daily., Disp: 90 tablet, Rfl: 3 .  rosuvastatin (CRESTOR) 10 MG tablet, Take 1 tablet (10 mg total) by mouth daily at 6 PM., Disp: 90 tablet, Rfl: 3 .  triamterene-hydrochlorothiazide (MAXZIDE-25) 37.5-25 MG tablet, TAKE 1 TABLET BY MOUTH EVERY DAY, Disp: 90 tablet, Rfl: 2 .  Vitamin D, Ergocalciferol, (DRISDOL) 1.25 MG (50000 UNIT) CAPS capsule, Take 1 capsule (50,000 Units total) by mouth every 7 (seven) days., Disp: 12 capsule, Rfl: 3   Garner Nash, DO [facility] 03/17/2020 10:14 AM

## 2020-03-17 NOTE — Progress Notes (Signed)
Full PFT performed today. °

## 2020-03-18 NOTE — Telephone Encounter (Signed)
Spoke with patient, added notes in referral

## 2020-03-24 ENCOUNTER — Other Ambulatory Visit: Payer: BC Managed Care – PPO

## 2020-04-01 MED ORDER — PANTOPRAZOLE SODIUM 40 MG PO TBEC: 40.0000 mg | DELAYED_RELEASE_TABLET | Freq: Every day | ORAL | 3 refills | Status: DC

## 2020-04-16 ENCOUNTER — Ambulatory Visit: Payer: BC Managed Care – PPO | Admitting: Endocrinology

## 2020-05-03 ENCOUNTER — Other Ambulatory Visit: Payer: Self-pay

## 2020-05-03 ENCOUNTER — Ambulatory Visit: Payer: BC Managed Care – PPO | Admitting: Endocrinology

## 2020-05-03 ENCOUNTER — Encounter: Payer: Self-pay | Admitting: Endocrinology

## 2020-05-03 DIAGNOSIS — M810 Age-related osteoporosis without current pathological fracture: Secondary | ICD-10-CM | POA: Insufficient documentation

## 2020-05-03 LAB — BASIC METABOLIC PANEL
BUN: 14 mg/dL (ref 6–23)
CO2: 28 mEq/L (ref 19–32)
Calcium: 9.6 mg/dL (ref 8.4–10.5)
Chloride: 103 mEq/L (ref 96–112)
Creatinine, Ser: 0.81 mg/dL (ref 0.40–1.20)
GFR: 76.91 mL/min (ref >60.00)
Glucose, Bld: 94 mg/dL (ref 70–99)
Potassium: 3.2 mEq/L — ABNORMAL LOW (ref 3.5–5.1)
Sodium: 140 mEq/L (ref 135–145)

## 2020-05-03 NOTE — Patient Instructions (Addendum)
Blood tests are requested for you today.  We'll let you know about the results.  I am requesting a once a year infusion called "Reclast."  you will receive a phone call, about a day and time for an appointment Please come back for a follow-up appointment in 6 months.   We'll plan to recheck the bone density next year.  If necessary, we'll add the Prolia.

## 2020-05-03 NOTE — Progress Notes (Signed)
Subjective:    Patient ID: Tamara Myers, female    DOB: Aug 20, 1955, 65 y.o.   MRN: [id number]  HPI Pt is referred by Dr Renold Genta, for osteoporosis.  Pt was noted to have osteoporosis in 2021.  She has never been on medication for this.  She has no history of any of the following: early menopause, multiple myeloma, renal dz, thyroid problems, prolonged bedrest, alcoholism, urolithiasis, smoking, liver dz, and hyperparathyroidism.  She does not take heparin or anticonvulsants.  She took prednisone 2020-2021, for sarcoidosis.  She fx T-spine in 2021.  She also had fxs several fingers as a child right thumb (2015)--all with injuries.  No injury.  She takes Vit-D, 50,000 units/week, x 1 year.  Main symptom is back pain.   Past Medical History:  Diagnosis Date   Cancer ([place])    basal cell carcinoma   Complication of anesthesia    slow to wake up   Dyspnea    on exertion   GERD (gastroesophageal reflux disease)    Hypertension    Melanoma ([place]) 2018   left upper arm   Moderate persistent asthma without complication 18/11/4164   PONV (postoperative nausea and vomiting)    Post menopausal syndrome     Past Surgical History:  Procedure Laterality Date   CESAREAN SECTION     OPEN REDUCTION INTERNAL FIXATION (ORIF) METACARPAL Right 11/24/2014   Procedure: OPEN REDUCTION INTERNAL FIXATION (ORIF) RIGHT THUMB;  Surgeon: Iran Planas, MD;  Location: [facility];  Service: Orthopedics;  Laterality: Right;   thumb surgery Right 2014   VIDEO BRONCHOSCOPY N/A 11/20/2018   Procedure: Video Bronchoscopy With Fluoro;  Surgeon: Garner Nash, DO;  Location[facility];  Service: Thoracic;  Laterality: N/A;   VIDEO BRONCHOSCOPY WITH ENDOBRONCHIAL ULTRASOUND N/A 11/20/2018   Procedure: VIDEO BRONCHOSCOPY WITH ENDOBRONCHIAL ULTRASOUND;  Surgeon: Garner Nash, DO;  Location: [facility];  Service: Thoracic;  Laterality: N/A;    Social History   Socioeconomic History   Marital status: Married    Spouse  name: Not on file   Number of children: Not on file   Years of education: Not on file   Highest education level: Not on file  Occupational History   Not on file  Tobacco Use   Smoking status: Never Smoker   Smokeless tobacco: Never Used  Vaping Use   Vaping Use: Never used  Substance and Sexual Activity   Alcohol use: No   Drug use: No   Sexual activity: Not on file  Other Topics Concern   Not on file  Social History Narrative   Not on file   Social Determinants of Health   Financial Resource Strain: Not on file  Food Insecurity: Not on file  Transportation Needs: Not on file  Physical Activity: Not on file  Stress: Not on file  Social Connections: Not on file  Intimate Partner Violence: Not on file    Current Outpatient Medications on File Prior to Visit  Medication Sig Dispense Refill   albuterol (PROVENTIL HFA;VENTOLIN HFA) 108 (90 Base) MCG/ACT inhaler Inhale 2 puffs into the lungs every 6 (six) hours as needed for wheezing or shortness of breath. 18 g 1   amLODipine (NORVASC) 5 MG tablet TAKE 1 TABLET BY MOUTH EVERY DAY 90 tablet 3   cholecalciferol (VITAMIN D3) 10 MCG (400 UNIT) TABS tablet Take 400 Units by mouth 2 (two) times daily.     Fluticasone-Salmeterol (ADVAIR DISKUS) 100-50 MCG/DOSE AEPB Inhale 1 puff into  the lungs 2 (two) times daily. 60 each 3   folic acid (FOLVITE) 1 MG tablet Take 1 tablet (1 mg total) by mouth daily. 90 tablet 0   methotrexate (RHEUMATREX) 10 MG tablet Take 10 mg by mouth once a week. Caution: Chemotherapy. Protect from light.     pantoprazole (PROTONIX) 40 MG tablet Take 1 tablet (40 mg total) by mouth daily. 90 tablet 3   rosuvastatin (CRESTOR) 10 MG tablet Take 1 tablet (10 mg total) by mouth daily at 6 PM. 90 tablet 3   triamterene-hydrochlorothiazide (MAXZIDE-25) 37.5-25 MG tablet TAKE 1 TABLET BY MOUTH EVERY DAY 90 tablet 2   Vitamin D, Ergocalciferol, (DRISDOL) 1.25 MG (50000 UNIT) CAPS capsule Take 1  capsule (50,000 Units total) by mouth every 7 (seven) days. 12 capsule 3   No current facility-administered medications on file prior to visit.    No Known Allergies  Family History  Problem Relation Age of Onset   Osteoporosis Mother    Diabetes Father    Heart disease Father    Hyperlipidemia Father    Hypertension Father    Pulmonary fibrosis Father    Asthma Daughter    Breast cancer Neg Hx    Allergic rhinitis Neg Hx    Eczema Neg Hx    Urticaria Neg Hx    Angioedema Neg Hx     BP 110/82    Pulse 86    Ht 5' 4" (1.626 m)    Wt 133 lb (60.3 kg)    SpO2 99%    BMI 22.83 kg/m    Review of Systems denies weight loss, heartburn, falls, muscle cramps, memory loss.      Objective:   Physical Exam VITAL SIGNS:  See vs page GENERAL: no distress NECK: There is no palpable thyroid enlargement.  No thyroid nodule is palpable.  No palpable lymphadenopathy at the anterior neck. SPINE: no kyphosis GAIT: normal and steady  BMD measured at AP Spine L1-L2 is 0.837 g/cm2 with a T-score of -2.7.  Plain x-rays: Lower thoracic vertebral body fractures are noted at approximately T9, T10, T11. These may be new.  Lab Results  Component Value Date   TSH 1.46 01/06/2020   Lab Results  Component Value Date   CALCIUM 10.0 01/06/2020   25-OH vit-D=76  I have reviewed outside records, and summarized: Pt was noted to have osteoporosis, and referred here.  pulm rx'ed protonix for chronic cough.  No oral steroids--just inhaled.        Assessment & Plan:  Osteoporosis, new to me, with pathologic fxs.  Uncontrolled.   GERD: This limits rx options  Patient Instructions  Blood tests are requested for you today.  We'll let you know about the results.  I am requesting a once a year infusion called "Reclast."  you will receive a phone call, about a day and time for an appointment Please come back for a follow-up appointment in 6 months.   We'll plan to recheck the bone  density next year.  If necessary, we'll add the Prolia.

## 2020-05-05 ENCOUNTER — Encounter: Payer: Self-pay | Admitting: Endocrinology

## 2020-05-08 LAB — VITAMIN D 1,25 DIHYDROXY
Vitamin D 1, 25 (OH)2 Total: 56 pg/mL (ref 18–72)
Vitamin D2 1, 25 (OH)2: 41 pg/mL
Vitamin D3 1, 25 (OH)2: 15 pg/mL

## 2020-05-08 LAB — PROTEIN ELECTROPHORESIS, SERUM
Albumin ELP: 4 g/dL (ref 3.8–4.8)
Alpha 1: 0.4 g/dL — ABNORMAL HIGH (ref 0.2–0.3)
Alpha 2: 0.8 g/dL (ref 0.5–0.9)
Beta 2: 0.2 g/dL (ref 0.2–0.5)
Beta Globulin: 0.4 g/dL (ref 0.4–0.6)
Gamma Globulin: 0.4 g/dL — ABNORMAL LOW (ref 0.8–1.7)
Total Protein: 6.2 g/dL (ref 6.1–8.1)

## 2020-05-08 LAB — PTH, INTACT AND CALCIUM
Calcium: 9.7 mg/dL (ref 8.6–10.4)
PTH: 15 pg/mL (ref 14–64)

## 2020-05-12 ENCOUNTER — Other Ambulatory Visit: Payer: Self-pay | Admitting: Internal Medicine

## 2020-05-27 MED ORDER — FOLIC ACID 1 MG PO TABS
1.0000 mg | ORAL_TABLET | Freq: Every day | ORAL | 0 refills | Status: DC
Start: 2020-05-27 — End: 2020-08-26

## 2020-06-07 MED ORDER — FLUTICASONE-SALMETEROL 100-50 MCG/DOSE IN AEPB
1.0000 | INHALATION_SPRAY | Freq: Two times a day (BID) | RESPIRATORY_TRACT | 4 refills | Status: DC
Start: 2020-06-07 — End: 2020-11-10

## 2020-06-07 NOTE — Telephone Encounter (Signed)
Called and spoke with pharmacy who stated that patient did not have any refills left. New RX has been sent over to pharmacy. Nothing further needed at this time.

## 2020-06-16 ENCOUNTER — Other Ambulatory Visit: Payer: BC Managed Care – PPO

## 2020-06-17 ENCOUNTER — Telehealth: Payer: Self-pay | Admitting: Endocrinology

## 2020-06-17 NOTE — Telephone Encounter (Signed)
Please see previous message to call Patient

## 2020-06-17 NOTE — Telephone Encounter (Signed)
Patient called and requests to be called at ph# [phone] re: Patient has been waiting to hear from our office since early January about infusion treatment.

## 2020-06-18 NOTE — Telephone Encounter (Signed)
I need reclast form

## 2020-06-18 NOTE — Telephone Encounter (Signed)
Patient has been waiting to hear from our office since early January about infusion treatment. Please advise

## 2020-06-21 NOTE — Telephone Encounter (Signed)
Re-clast form placed on Dr. Loanne Drilling table for Dr. Loanne Drilling to complete

## 2020-07-01 NOTE — Telephone Encounter (Signed)
Patient requests to be called at ph# [phone] re: Status of scheduling Reclast appointment. Patient states she has been waiting since January to be called with an appointment to be scheduled for reclast.

## 2020-07-02 ENCOUNTER — Other Ambulatory Visit: Payer: Self-pay

## 2020-07-02 ENCOUNTER — Other Ambulatory Visit: Payer: BC Managed Care – PPO | Admitting: Internal Medicine

## 2020-07-02 DIAGNOSIS — E7800 Pure hypercholesterolemia, unspecified: Secondary | ICD-10-CM

## 2020-07-03 LAB — HEPATIC FUNCTION PANEL
AG Ratio: 3.1 (calc) — ABNORMAL HIGH (ref 1.0–2.5)
ALT: 31 U/L — ABNORMAL HIGH (ref 6–29)
AST: 28 U/L (ref 10–35)
Albumin: 4.4 g/dL (ref 3.6–5.1)
Alkaline phosphatase (APISO): 53 U/L (ref 37–153)
Bilirubin, Direct: 0.1 mg/dL (ref 0.0–0.2)
Globulin: 1.4 g/dL (calc) — ABNORMAL LOW (ref 1.9–3.7)
Indirect Bilirubin: 0.5 mg/dL (calc) (ref 0.2–1.2)
Total Bilirubin: 0.6 mg/dL (ref 0.2–1.2)
Total Protein: 5.8 g/dL — ABNORMAL LOW (ref 6.1–8.1)

## 2020-07-03 LAB — LIPID PANEL
Cholesterol: 169 mg/dL (ref <200)
HDL: 89 mg/dL (ref >=50)
LDL Cholesterol (Calc): 66 mg/dL (calc)
Non-HDL Cholesterol (Calc): 80 mg/dL (calc) (ref <130)
Total CHOL/HDL Ratio: 1.9 (calc) (ref <5.0)
Triglycerides: 60 mg/dL (ref <150)

## 2020-07-05 ENCOUNTER — Ambulatory Visit: Payer: BC Managed Care – PPO | Admitting: Pulmonary Disease

## 2020-07-09 ENCOUNTER — Other Ambulatory Visit: Payer: Self-pay

## 2020-07-09 ENCOUNTER — Encounter: Payer: Self-pay | Admitting: Internal Medicine

## 2020-07-09 ENCOUNTER — Ambulatory Visit: Payer: BC Managed Care – PPO | Admitting: Internal Medicine

## 2020-07-09 ENCOUNTER — Ambulatory Visit
Admission: RE | Admit: 2020-07-09 | Discharge: 2020-07-09 | Disposition: A | Discharge status: Home / Self Care | Payer: BC Managed Care – PPO | Source: Ambulatory Visit | Attending: Internal Medicine | Admitting: Internal Medicine

## 2020-07-09 VITALS — BP 130/80 | HR 74 | Ht 64.0 in | Wt 132.0 lb

## 2020-07-09 DIAGNOSIS — M25512 Pain in left shoulder: Secondary | ICD-10-CM

## 2020-07-09 DIAGNOSIS — Z92241 Personal history of systemic steroid therapy: Secondary | ICD-10-CM | POA: Diagnosis not present

## 2020-07-09 DIAGNOSIS — I10 Essential (primary) hypertension: Secondary | ICD-10-CM

## 2020-07-09 DIAGNOSIS — S4992XA Unspecified injury of left shoulder and upper arm, initial encounter: Secondary | ICD-10-CM | POA: Diagnosis not present

## 2020-07-09 DIAGNOSIS — D860 Sarcoidosis of lung: Secondary | ICD-10-CM | POA: Diagnosis not present

## 2020-07-09 DIAGNOSIS — M19012 Primary osteoarthritis, left shoulder: Secondary | ICD-10-CM | POA: Diagnosis not present

## 2020-07-09 DIAGNOSIS — M25712 Osteophyte, left shoulder: Secondary | ICD-10-CM | POA: Diagnosis not present

## 2020-07-09 DIAGNOSIS — E7800 Pure hypercholesterolemia, unspecified: Secondary | ICD-10-CM

## 2020-07-09 MED ORDER — PREDNISONE 10 MG PO TABS: ORAL_TABLET | ORAL | 1 refills | Status: DC

## 2020-07-09 NOTE — Progress Notes (Signed)
   Subjective:    Patient ID: Tamara Myers, female    DOB: 06-Mar-1956, 65 y.o.   MRN: [id number]  HPI 65 year old Female seen for 6 month follow up appt. 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 Valeta Harms. Treated initially with 3 month prednisone taper.Subsequently Methotrexate was added. By November 2021 was just on maintenance dose of MTX currently 10 mg weekly. Has follow up with Dr. Valeta Harms later this month. Chest CT to be obtained.  Here today for follow up on hyperlipidemia on generic Crestor 10 mg daily.   Also for follow up on HTN treated with Maxzide 25 and amlodipine  Bone density Sept 2021 showed osteoporosis LS spine. Was not able to get Prolia approved by insurance. Referred to Dr. Loanne Drilling. Apparently Reclast has been approved but she is waiting to here when she can receive it. She is taking Vitamin D 50,000 inits weekly. Level Vitamin D 1,25 was 64 in January. SPEP showed no monoclonal spike.  Recently this past week was in yoga clast went down to do "plank" and felt pop and immediate pain in left anteroglenohumeral joint. Now has pain and decreased ROM left shoulder. Hurts to raise left shoulder.  Review of Systems see above     Objective:   Physical Exam  BP 130/80 pulse 74 pulse ox 99% Weight 132 pounds BMI 22.66  Has lost 4 pounds since my last visit with her October 2021.  Has decreased ROM left upper extremity secondary to pain and has pain anterior glenohumeral joint with palpation. Muscle strength is 4-5/5 in LUE. No crepitus appreciated. Tender over biceps muscle as well.       Assessment & Plan:  Left shoulder injury-? Tendinitis or tear? Will order plain Xray of left shoulder today and take short taper of Prednisone 10 mg tabs 6-5-4-3-2-1 #21 tabs. Apply heat or ice. Shown exercises to improve ROM. May need to see orthopedist.  Osteoporosis- we have sent fax to Endocrine office inquiring about Reclast availability.  HTN-  stable on current regimen- no changes in therapy  Sarcoidosis- to see Dr. Valeta Harms soon  Hyperlipidemia-lipid panel normal on current dose of statin. No changes in medication. Very slight elevation SGPT  Unclear ? Significance if any- result similar to labs 06/23/19. No change in medication or dosage at this time  RTC for Annual CPE and labs in 6 months.If shoulder issue persists, she will contact her orthopedist.   Addendum: Xray shows no fracture or dislocation. Mild osteoarthritis.

## 2020-07-11 NOTE — Patient Instructions (Signed)
To have Xray of left shoulder and take short steroid taper for pain and decreased ROM. If not improving, see orthopedist. Continue other meds as prescribed and RTC here for annual exam in 6 months. No change in current meds at this time.

## 2020-07-12 ENCOUNTER — Telehealth: Payer: Self-pay

## 2020-07-12 NOTE — Telephone Encounter (Signed)
Reclast papers placed in Linda's box

## 2020-07-14 ENCOUNTER — Other Ambulatory Visit: Payer: BC Managed Care – PPO

## 2020-07-15 ENCOUNTER — Ambulatory Visit (INDEPENDENT_AMBULATORY_CARE_PROVIDER_SITE_OTHER)
Admission: RE | Admit: 2020-07-15 | Discharge: 2020-07-15 | Disposition: A | Discharge status: Home / Self Care | Payer: BC Managed Care – PPO | Source: Ambulatory Visit | Attending: Pulmonary Disease | Admitting: Pulmonary Disease

## 2020-07-15 ENCOUNTER — Other Ambulatory Visit: Payer: Self-pay

## 2020-07-15 DIAGNOSIS — J849 Interstitial pulmonary disease, unspecified: Secondary | ICD-10-CM

## 2020-07-15 DIAGNOSIS — S22060A Wedge compression fracture of T7-T8 vertebra, initial encounter for closed fracture: Secondary | ICD-10-CM | POA: Diagnosis not present

## 2020-07-15 DIAGNOSIS — D8689 Sarcoidosis of other sites: Secondary | ICD-10-CM | POA: Diagnosis not present

## 2020-07-15 DIAGNOSIS — I2510 Atherosclerotic heart disease of native coronary artery without angina pectoris: Secondary | ICD-10-CM | POA: Diagnosis not present

## 2020-07-15 DIAGNOSIS — S22080A Wedge compression fracture of T11-T12 vertebra, initial encounter for closed fracture: Secondary | ICD-10-CM | POA: Diagnosis not present

## 2020-07-19 ENCOUNTER — Other Ambulatory Visit: Payer: Self-pay

## 2020-07-19 ENCOUNTER — Ambulatory Visit (INDEPENDENT_AMBULATORY_CARE_PROVIDER_SITE_OTHER): Payer: BC Managed Care – PPO | Admitting: Nutrition

## 2020-07-19 DIAGNOSIS — M810 Age-related osteoporosis without current pathological fracture: Secondary | ICD-10-CM | POA: Diagnosis not present

## 2020-07-19 DIAGNOSIS — M818 Other osteoporosis without current pathological fracture: Secondary | ICD-10-CM | POA: Diagnosis not present

## 2020-07-19 NOTE — Progress Notes (Signed)
Per Dr. Cordelia Pen note on 05/03/20, and after the patient signed the consent, and IV was started in her right arm. 3 ccs of Normal saline was infused, and no signes of swelling occurred.  At 8:10AM, 5mg  of zolendronic acid was infused without any symptoms of dizziness, light headedness or discomfort.  At 8:35AM the IV was finished, and 3 ccs of Normal Saline was infused to flush the tubing.  The IV was d/ced and the site showed no signs of redness or swelling.  Patient was encouraged to drink 6-8 glass of water today.  She agreed to do this and had no final questions.

## 2020-07-19 NOTE — Patient Instructions (Signed)
Drink 6-8 glasses of water today.

## 2020-07-28 ENCOUNTER — Encounter: Payer: Self-pay | Admitting: Pulmonary Disease

## 2020-07-28 ENCOUNTER — Ambulatory Visit: Payer: BC Managed Care – PPO | Admitting: Pulmonary Disease

## 2020-07-28 ENCOUNTER — Other Ambulatory Visit: Payer: Self-pay

## 2020-07-28 VITALS — BP 122/80 | HR 77 | Temp 98.1°F | Ht 64.0 in | Wt 133.4 lb

## 2020-07-28 DIAGNOSIS — Z79899 Other long term (current) drug therapy: Secondary | ICD-10-CM | POA: Diagnosis not present

## 2020-07-28 DIAGNOSIS — Z5181 Encounter for therapeutic drug level monitoring: Secondary | ICD-10-CM | POA: Diagnosis not present

## 2020-07-28 DIAGNOSIS — J4540 Moderate persistent asthma, uncomplicated: Secondary | ICD-10-CM | POA: Diagnosis not present

## 2020-07-28 DIAGNOSIS — D869 Sarcoidosis, unspecified: Secondary | ICD-10-CM | POA: Diagnosis not present

## 2020-07-28 DIAGNOSIS — Z79631 Long term (current) use of antimetabolite agent: Secondary | ICD-10-CM

## 2020-07-28 DIAGNOSIS — J849 Interstitial pulmonary disease, unspecified: Secondary | ICD-10-CM

## 2020-07-28 NOTE — Progress Notes (Signed)
Synopsis: Referred in March 2020 for abnormal CT of the chest by Elby Showers, MD  Subjective:   PATIENT ID: Tamara Myers GENDER: female DOB: 1955/10/22, MRN: [id number]  Chief Complaint  Patient presents with  . Follow-up    Intermittent dry cough for past few months, improved since last visit.     Patient has had complaints of progressive cough for the past year.  She was recently placed on a PPI for treatment of reflux.  She was ultimately referred to allergy.  She saw Dr. Maudie Mercury which evaluated for her cough.  She had a chest x-ray which revealed interstitial markings.  She ultimately had a noncontrast CT of the chest which revealed perilymphatic micro-nodularity consistent with pulmonary sarcoidosis with small areas of bilateral hilar adenopathy and a right-sided 1.4 cm hilar lymph node.  She had a dominant right-sided right lower lobe nodule that measured 1 cm in size also likely secondary to sarcoid however recommending a 3 to 89-monthfollow-up.  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  Father died of pulmonary fibrosis.  Patient has not had any eye or skin symptoms.  OV 03/12/2019:Patient underwent bronchoscopy November 20, 2018.  Bronchoscopy tissue biopsy revealed granulomatous inflammation.  Follow-up visit was via telephone.  In August 2020.  She was then placed on a 379-monthaper of prednisone with PCP prophylaxis of Bactrim.  Patient here today for follow-up after recent diagnosis of sarcoidosis.  Overall patient doing well today.  She recently completed her 3-77-monthper of prednisone.  Over the past 2 weeks she has noticed increase in her cough.  Today reviewed her CT scan which was completed getting of week which revealed improvement of the nodularity and parenchymal changes consistent with sarcoid however they are still  present.  She still has dry cough.  OV 04/17/2019: Patient here today for follow-up after starting methotrexate and restarting her oral prednisone for sarcoidosis.  Doing well today.  Respiratory status is much improved as to previous.  She does not have significant cough anymore.  She feels a little bit better when it comes to her dyspnea on exertion but is still about the same.  Occasionally has cough but not much.  She has been faithful with taking her medication.  She has uptitrated methotrexate to 10 mg daily.  She is planned for labs today.  She overall denies hemoptysis weight loss, fevers chills night sweats.  OV 06/25/2019: Respiratory symptoms stable at this time.  She does have some exertional dyspnea when she does yoga in the evenings.  Otherwise she is able to complete most activities of daily living.  Still trying to stay social distance.  She has noticed some puffiness in her cheeks that she feels may be related to fluid retention from her steroids.  She has noticed easy bruising as well.  Also right eye had a subconjunctival hemorrhage which was new that occurred spontaneously.  Patient denies hemoptysis fevers chills night sweats weight loss.  OV 12/04/2019: Patient here today for follow-up of sarcoidosis. Otherwise doing well with no symptoms at this time. Taking 10 mg of prednisone today and 10 mg weekly of methotrexate. She has noticed some weight gain. She does feel like her face is a little bit more puffy. We did review her CT imaging that was completed and March 2021 which showed improvement. She has  had 2 subsequent follow-ups with Wyn Quaker, NP since last time I saw her in the office.   OV 03/17/2020: Here today for follow-up of sarcoidosis and immunosuppression.  Currently managed with prednisone and methotrexate.  After last office visit and plans were successfully tapering from prednisone.  Overall patient doing very well today from a respiratory standpoint.  She is slowly  recovering from her thoracic compression fractures.  She is off prednisone at this time maintenance dose of methotrexate.  Patient did have pulmonary function test that were completed prior to today's office visit which revealed postbronchodilator normal ratio, FVC of 79, FEV1 80%, TLC 87% predicted, DLCO 101%.  Stable in comparison.  Results reviewed today and interpreted in the office.  With patient.  OV 07/28/2020: off prednisone around October. Not quite off for 6 months. Still maintained on MTX.  At this point her respiratory symptoms are stable.  She recently had blood work at the beginning of the year which also appears stable.  Serum creatinine reviewed as well as AST ALT within normal limits.  She is here today for follow-up after recent CT scan of the chest completed March 2022 which reveals resolution of her parenchymal abnormalities of sarcoidosis.  She has calcified mediastinal nodes.    Past Medical History:  Diagnosis Date  . Cancer ([place])    basal cell carcinoma  . Complication of anesthesia    slow to wake up  . Dyspnea    on exertion  . GERD (gastroesophageal reflux disease)   . Hypertension   . Melanoma ([place]) 2018   left upper arm  . Moderate persistent asthma without complication 55/06/7480  . PONV (postoperative nausea and vomiting)   . Post menopausal syndrome      Family History  Problem Relation Age of Onset  . Osteoporosis Mother   . Diabetes Father   . Heart disease Father   . Hyperlipidemia Father   . Hypertension Father   . Pulmonary fibrosis Father   . Asthma Daughter   . Breast cancer Neg Hx   . Allergic rhinitis Neg Hx   . Eczema Neg Hx   . Urticaria Neg Hx   . Angioedema Neg Hx      Past Surgical History:  Procedure Laterality Date  . CESAREAN SECTION    . OPEN REDUCTION INTERNAL FIXATION (ORIF) METACARPAL Right 11/24/2014   Procedure: OPEN REDUCTION INTERNAL FIXATION (ORIF) RIGHT THUMB;  Surgeon: Iran Planas, MD;  Location: [facility];  Service:  Orthopedics;  Laterality: Right;  . thumb surgery Right 2014  . VIDEO BRONCHOSCOPY N/A 11/20/2018   Procedure: Video Bronchoscopy With Fluoro;  Surgeon: Garner Nash, DO;  Location: [facility];  Service: Thoracic;  Laterality: N/A;  . VIDEO BRONCHOSCOPY WITH ENDOBRONCHIAL ULTRASOUND N/A 11/20/2018   Procedure: VIDEO BRONCHOSCOPY WITH ENDOBRONCHIAL ULTRASOUND;  Surgeon: Garner Nash, DO;  Location: [facility];  Service: Thoracic;  Laterality: N/A;    Social History   Socioeconomic History  . Marital status: Married    Spouse name: Not on file  . Number of children: Not on file  . Years of education: Not on file  . Highest education level: Not on file  Occupational History  . Not on file  Tobacco Use  . Smoking status: Never Smoker  . Smokeless tobacco: Never Used  Vaping Use  . Vaping Use: Never used  Substance and Sexual Activity  . Alcohol use: No  . Drug use: No  . Sexual activity: Not on file  Other  Topics Concern  . Not on file  Social History Narrative  . Not on file   Social Determinants of Health   Financial Resource Strain: Not on file  Food Insecurity: Not on file  Transportation Needs: Not on file  Physical Activity: Not on file  Stress: Not on file  Social Connections: Not on file  Intimate Partner Violence: Not on file     No Known Allergies   Outpatient Medications Prior to Visit  Medication Sig Dispense Refill  . albuterol (PROVENTIL HFA;VENTOLIN HFA) 108 (90 Base) MCG/ACT inhaler Inhale 2 puffs into the lungs every 6 (six) hours as needed for wheezing or shortness of breath. 18 g 1  . amLODipine (NORVASC) 5 MG tablet TAKE 1 TABLET BY MOUTH EVERY DAY 90 tablet 3  . cholecalciferol (VITAMIN D3) 10 MCG (400 UNIT) TABS tablet Take 400 Units by mouth 2 (two) times daily.    . Fluticasone-Salmeterol (ADVAIR DISKUS) 100-50 MCG/DOSE AEPB Inhale 1 puff into the lungs 2 (two) times daily. 60 each 4  . folic acid (FOLVITE) 1 MG tablet Take 1 tablet (1 mg total) by  mouth daily. 90 tablet 0  . methotrexate (RHEUMATREX) 10 MG tablet Take 10 mg by mouth once a week. Caution: Chemotherapy. Protect from light.    . pantoprazole (PROTONIX) 40 MG tablet Take 1 tablet (40 mg total) by mouth daily. 90 tablet 3  . rosuvastatin (CRESTOR) 10 MG tablet Take 1 tablet (10 mg total) by mouth daily at 6 PM. 90 tablet 3  . triamterene-hydrochlorothiazide (MAXZIDE-25) 37.5-25 MG tablet TAKE 1 TABLET BY MOUTH EVERY DAY 90 tablet 2  . Vitamin D, Ergocalciferol, (DRISDOL) 1.25 MG (50000 UNIT) CAPS capsule Take 1 capsule (50,000 Units total) by mouth every 7 (seven) days. 12 capsule 3  . predniSONE (DELTASONE) 10 MG tablet Take in tapering course as directed 6-5-4-3-2-1 for left shoulder pain 21 tablet 1   No facility-administered medications prior to visit.    Review of Systems  Constitutional: Negative for chills, fever, malaise/fatigue and weight loss.  HENT: Negative for hearing loss, sore throat and tinnitus.   Eyes: Negative for blurred vision and double vision.  Respiratory: Positive for shortness of breath. Negative for cough, hemoptysis, sputum production, wheezing and stridor.   Cardiovascular: Negative for chest pain, palpitations, orthopnea, leg swelling and PND.  Gastrointestinal: Negative for abdominal pain, constipation, diarrhea, heartburn, nausea and vomiting.  Genitourinary: Negative for dysuria, hematuria and urgency.  Musculoskeletal: Negative for joint pain and myalgias.  Skin: Negative for itching and rash.  Neurological: Negative for dizziness, tingling, weakness and headaches.  Endo/Heme/Allergies: Negative for environmental allergies. Does not bruise/bleed easily.  Psychiatric/Behavioral: Negative for depression. The patient is not nervous/anxious and does not have insomnia.   All other systems reviewed and are negative.    Objective:  Physical Exam Vitals reviewed.  Constitutional:      General: She is not in acute distress.    Appearance:  She is well-developed.  HENT:     Head: Normocephalic and atraumatic.  Eyes:     General: No scleral icterus.    Conjunctiva/sclera: Conjunctivae normal.     Pupils: Pupils are equal, round, and reactive to light.  Neck:     Vascular: No JVD.     Trachea: No tracheal deviation.  Cardiovascular:     Rate and Rhythm: Normal rate and regular rhythm.     Heart sounds: Normal heart sounds. No murmur heard.   Pulmonary:     Effort: Pulmonary effort  is normal. No tachypnea, accessory muscle usage or respiratory distress.     Breath sounds: No stridor. No wheezing, rhonchi or rales.  Abdominal:     General: Bowel sounds are normal. There is no distension.     Palpations: Abdomen is soft.     Tenderness: There is no abdominal tenderness.  Musculoskeletal:        General: No tenderness.     Cervical back: Neck supple.  Lymphadenopathy:     Cervical: No cervical adenopathy.  Skin:    General: Skin is warm and dry.     Capillary Refill: Capillary refill takes less than 2 seconds.     Findings: No rash.  Neurological:     Mental Status: She is alert and oriented to person, place, and time.  Psychiatric:        Behavior: Behavior normal.      Vitals:   07/28/20 1625  BP: 122/80  Pulse: 77  Temp: 98.1 F (36.7 C)  TempSrc: Temporal  SpO2: 97%  Weight: 133 lb 6.4 oz (60.5 kg)  Height: 5' 4" (1.626 m)   97% on RA BMI Readings from Last 3 Encounters:  07/28/20 22.90 kg/m  07/09/20 22.66 kg/m  05/03/20 22.83 kg/m   Wt Readings from Last 3 Encounters:  07/28/20 133 lb 6.4 oz (60.5 kg)  07/09/20 132 lb (59.9 kg)  05/03/20 133 lb (60.3 kg)     CBC    Component Value Date/Time   WBC 6.3 01/06/2020 0908   RBC 4.01 01/06/2020 0908   HGB 13.4 01/06/2020 0908   HCT 39.1 01/06/2020 0908   PLT 265 01/06/2020 0908   MCV 97.5 01/06/2020 0908   MCV 88.1 07/29/2011 1126   MCH 33.4 (H) 01/06/2020 0908   MCHC 34.3 01/06/2020 0908   RDW 12.6 01/06/2020 0908   LYMPHSABS 277  (L) 01/06/2020 0908   MONOABS 0.3 10/23/2019 1509   EOSABS 13 (L) 01/06/2020 0908   BASOSABS 13 01/06/2020 0908    Chest Imaging: CT chest 07/03/2018: Evidence of peri-lymphatic bilateral micro-nodularity consistent with sarcoidosis, small mediastinal and hilar lymph nodes.  And a right lower lobe 1 cm nodule likely consistent with the sarcoid however recommending 3 to 90-monthfollow-up by radiology. The patient's images have been independently reviewed by me.    November 2020: CT chest with perilymphatic bilateral micronodularity which is improved in comparison to her previous imaging. The patient's images have been independently reviewed by me.    High-resolution CT scan of the chest 06/30/2019: Positive response to therapy some improvement of parenchymal changes. The patient's images have been independently reviewed by me.    HRCT 06/2020: Resolution of parenchymal abnormalities can seen in the past related to sarcoidosis.  She has subcentimeter pulmonary nodules 1 within the right lower lobe and a 0.2 cm nodule in the right upper lobe.  She has stable calcified mediastinal and bilateral hilar nodes. The patient's images have been independently reviewed by me.    Pulmonary Functions Testing Results: PFT Results Latest Ref Rng & Units 03/17/2020 11/06/2018 06/27/2018  FVC-Pre L 2.51 2.86 2.82  FVC-Predicted Pre % 79 88 86  FVC-Post L 2.53 2.92 2.82  FVC-Predicted Post % 79 89 86  Pre FEV1/FVC % % 65 74 65  Post FEV1/FCV % % 77 70 76  FEV1-Pre L 1.64 2.13 1.83  FEV1-Predicted Pre % 67 85 73  FEV1-Post L 1.96 2.04 2.13  DLCO uncorrected ml/min/mmHg 20.04 20.13 20.01  DLCO UNC% % 101 100 99  DLCO corrected ml/min/mmHg 20.04 - -  DLCO COR %Predicted % 101 - -  DLVA Predicted % 119 124 109  TLC L 4.34 4.74 4.57  TLC % Predicted % 87 93 90  RV % Predicted % 83 94 80     FeNO: None   Pathology:   Bronchoscopy July 2020: Lung, biopsy, right middle lobe - BENIGN LUNG PARENCHYMA  WITH GRANULOMATOUS INFLAMMATION. - THERE IS NO EVIDENCE OF MALIGNANCY.  Echocardiogram: None   Heart Catheterization: None     Assessment & Plan:   Sarcoidosis  Encounter for medication monitoring - Plan: Comp Met (CMET), CBC w/Diff  Methotrexate, long term, current use - Plan: Comp Met (CMET), CBC w/Diff  Moderate persistent asthma without complication  Interstitial pulmonary disease ([place])  Encounter for monitoring of methotrexate therapy   Discussion:  This is a 65 year old female, history of parenchymal sarcoidosis, interstitial lung disease on immune suppression.  Currently maintained on methotrexate.  Tapered off of prednisone.  Prior cardiac MRI negative for any late gadolinium enhancement.  Plan: Continue maintenance dose of methotrexate 10 mg weekly. HRCT is reassuring. Repeat lab work at next office visit in 6 months. Appreciate input from endocrinology and Dr. Renold Genta regarding management of osteoporosis and compression fractures.  May consider after next office visit coming off of immune suppression his CT scan is reassuring with no evidence of recurrence of disease and there is calcification within the nodes.   I will discuss case with our [facility] colleagues.   Current Outpatient Medications:  .  albuterol (PROVENTIL HFA;VENTOLIN HFA) 108 (90 Base) MCG/ACT inhaler, Inhale 2 puffs into the lungs every 6 (six) hours as needed for wheezing or shortness of breath., Disp: 18 g, Rfl: 1 .  amLODipine (NORVASC) 5 MG tablet, TAKE 1 TABLET BY MOUTH EVERY DAY, Disp: 90 tablet, Rfl: 3 .  cholecalciferol (VITAMIN D3) 10 MCG (400 UNIT) TABS tablet, Take 400 Units by mouth 2 (two) times daily., Disp: , Rfl:  .  Fluticasone-Salmeterol (ADVAIR DISKUS) 100-50 MCG/DOSE AEPB, Inhale 1 puff into the lungs 2 (two) times daily., Disp: 60 each, Rfl: 4 .  folic acid (FOLVITE) 1 MG tablet, Take 1 tablet (1 mg total) by mouth daily., Disp: 90 tablet, Rfl: 0 .  methotrexate (RHEUMATREX) 10 MG  tablet, Take 10 mg by mouth once a week. Caution: Chemotherapy. Protect from light., Disp: , Rfl:  .  pantoprazole (PROTONIX) 40 MG tablet, Take 1 tablet (40 mg total) by mouth daily., Disp: 90 tablet, Rfl: 3 .  rosuvastatin (CRESTOR) 10 MG tablet, Take 1 tablet (10 mg total) by mouth daily at 6 PM., Disp: 90 tablet, Rfl: 3 .  triamterene-hydrochlorothiazide (MAXZIDE-25) 37.5-25 MG tablet, TAKE 1 TABLET BY MOUTH EVERY DAY, Disp: 90 tablet, Rfl: 2 .  Vitamin D, Ergocalciferol, (DRISDOL) 1.25 MG (50000 UNIT) CAPS capsule, Take 1 capsule (50,000 Units total) by mouth every 7 (seven) days., Disp: 12 capsule, Rfl: 3   I spent 40 minutes dedicated to the care of this patient on the date of this encounter to include pre-visit review of records, face-to-face time with the patient discussing conditions above, post visit ordering of testing, clinical documentation with the electronic health record, making appropriate referrals as documented, and communicating necessary findings to members of the patients care team.     Garner Nash, DO [facility] 07/28/2020 4:41 PM

## 2020-07-28 NOTE — Patient Instructions (Addendum)
Thank you for visiting Dr. Valeta Harms at [facility]. Today we recommend the following:  CMP and CBC prior to next visit  Stay on MTX   Return in about 6 months (around 01/28/2021) for with APP or Dr. Valeta Harms.    Please do your part to reduce the spread of COVID-19.

## 2020-08-02 ENCOUNTER — Other Ambulatory Visit: Payer: Self-pay | Admitting: Cardiology

## 2020-08-02 ENCOUNTER — Other Ambulatory Visit: Payer: Self-pay | Admitting: Internal Medicine

## 2020-08-02 NOTE — Telephone Encounter (Signed)
This is Dr. Schumann's pt 

## 2020-08-12 DIAGNOSIS — C44712 Basal cell carcinoma of skin of right lower limb, including hip: Secondary | ICD-10-CM | POA: Diagnosis not present

## 2020-08-12 DIAGNOSIS — D485 Neoplasm of uncertain behavior of skin: Secondary | ICD-10-CM | POA: Diagnosis not present

## 2020-08-12 DIAGNOSIS — C44619 Basal cell carcinoma of skin of left upper limb, including shoulder: Secondary | ICD-10-CM | POA: Diagnosis not present

## 2020-08-12 DIAGNOSIS — D2272 Melanocytic nevi of left lower limb, including hip: Secondary | ICD-10-CM | POA: Diagnosis not present

## 2020-08-12 DIAGNOSIS — D2271 Melanocytic nevi of right lower limb, including hip: Secondary | ICD-10-CM | POA: Diagnosis not present

## 2020-08-12 DIAGNOSIS — L570 Actinic keratosis: Secondary | ICD-10-CM | POA: Diagnosis not present

## 2020-08-12 DIAGNOSIS — D225 Melanocytic nevi of trunk: Secondary | ICD-10-CM | POA: Diagnosis not present

## 2020-08-12 DIAGNOSIS — Z85828 Personal history of other malignant neoplasm of skin: Secondary | ICD-10-CM | POA: Diagnosis not present

## 2020-08-18 IMAGING — MG DIGITAL SCREENING BILATERAL MAMMOGRAM WITH TOMO AND CAD
8 series · 8 of 24 positions shown · non-contrast
Comparison: Previous exam(s).

CLINICAL DATA: Screening.

EXAM:
DIGITAL SCREENING BILATERAL MAMMOGRAM WITH TOMO AND CAD

[R MLO synth-2D]
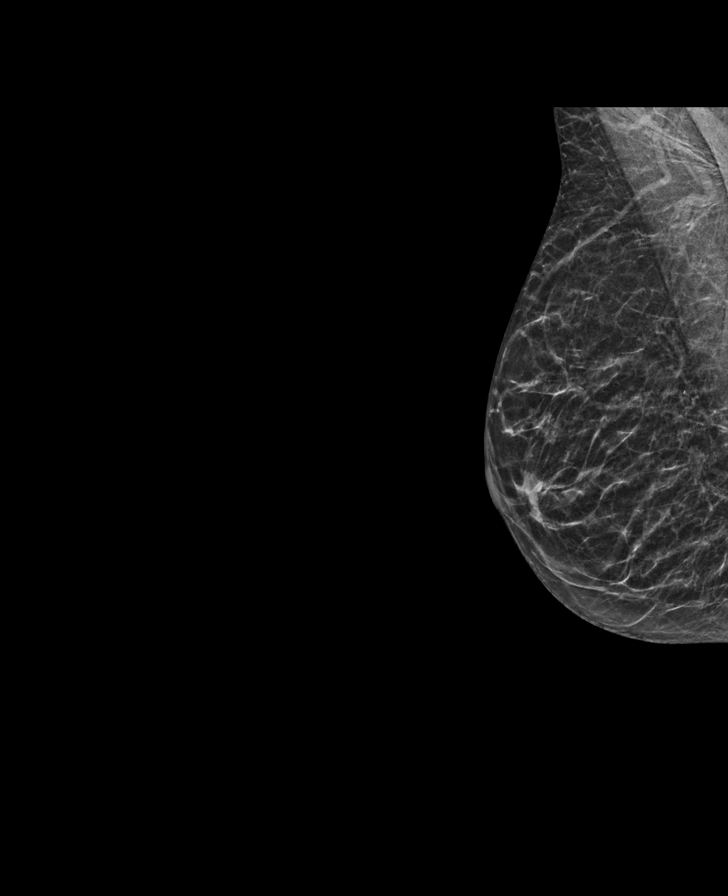

[R CC synth-2D]
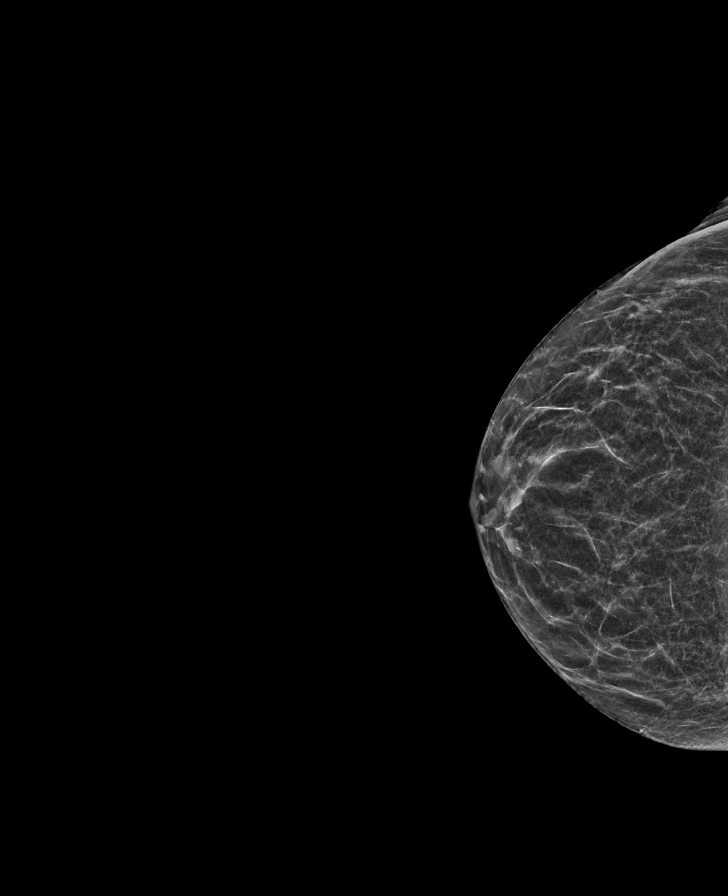

[L CC synth-2D]
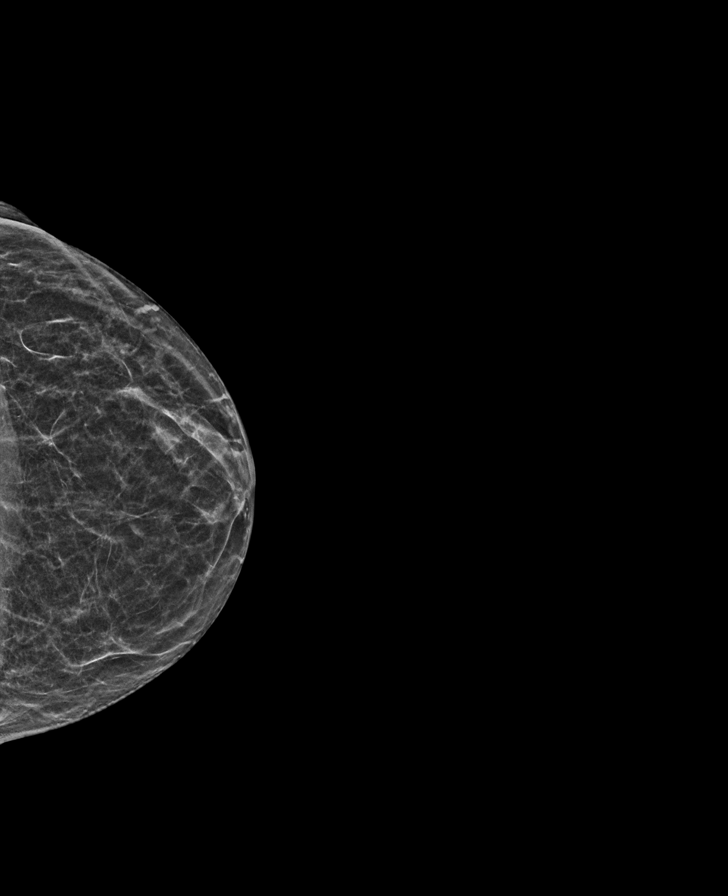

[L MLO synth-2D]
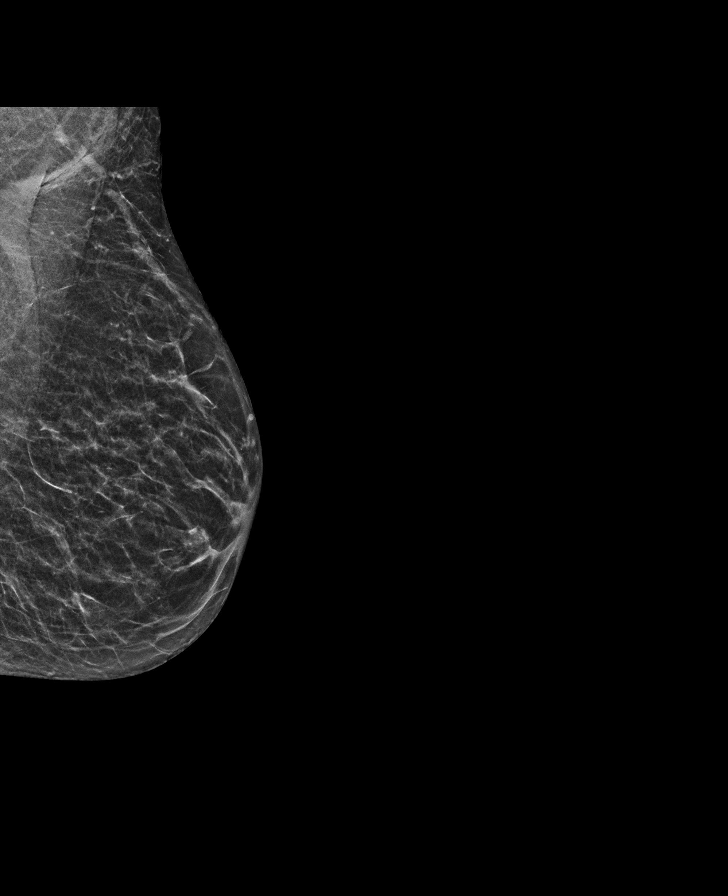

[R MLO tomo · tomo slice 22/43.0]
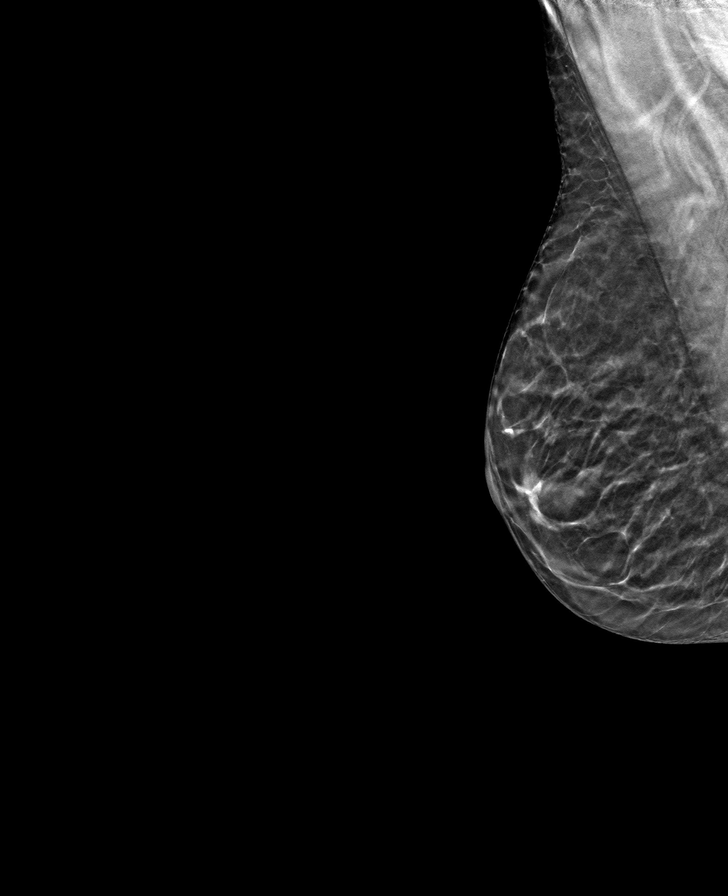

[R CC tomo · tomo slice 22/43.0]
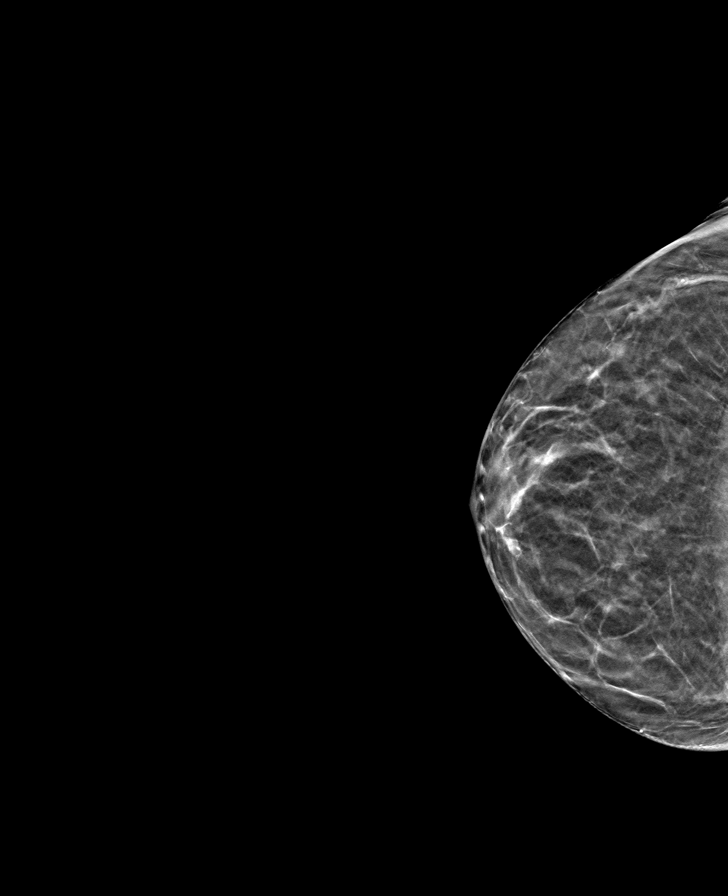

[L CC tomo · tomo slice 22/43.0]
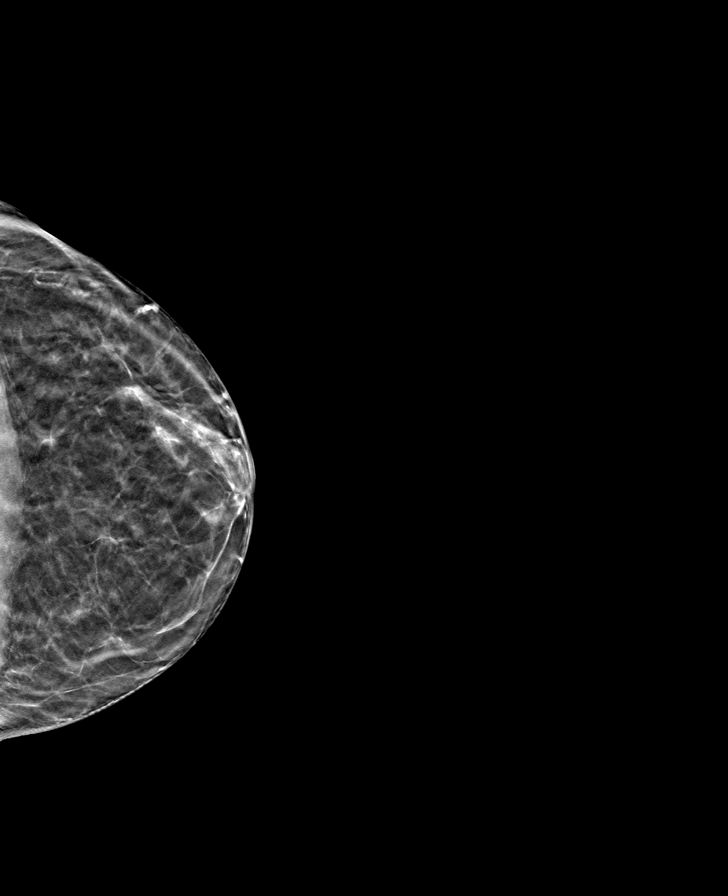

[L MLO tomo · tomo slice 21/42.0]
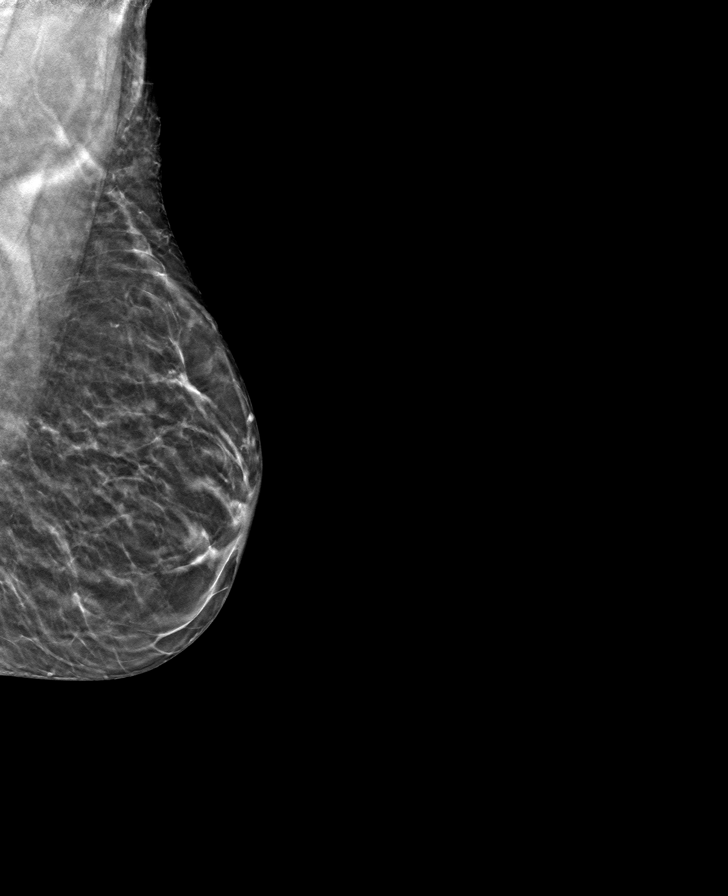

[8 of 24 positions shown; findings below may reference images not displayed]

ACR Breast Density Category b: There are scattered areas of
fibroglandular density.
FINDINGS: There are no findings suspicious for malignancy. Images were
processed with CAD.
IMPRESSION: No mammographic evidence of malignancy. A result letter of this
screening mammogram will be mailed directly to the patient.

RECOMMENDATION:
Screening mammogram in one year. (Code:[id number])

BI-RADS CATEGORY  1: Negative.

## 2020-08-26 MED ORDER — FOLIC ACID 1 MG PO TABS: 1.0000 mg | ORAL_TABLET | Freq: Every day | ORAL | 1 refills | Status: DC

## 2020-09-06 ENCOUNTER — Telehealth (INDEPENDENT_AMBULATORY_CARE_PROVIDER_SITE_OTHER): Payer: BC Managed Care – PPO | Admitting: Internal Medicine

## 2020-09-06 ENCOUNTER — Encounter: Payer: Self-pay | Admitting: Internal Medicine

## 2020-09-06 DIAGNOSIS — U071 COVID-19: Secondary | ICD-10-CM | POA: Diagnosis not present

## 2020-09-06 MED ORDER — ONDANSETRON HCL 4 MG PO TABS: 4.0000 mg | ORAL_TABLET | Freq: Three times a day (TID) | ORAL | 0 refills | Status: DC | PRN

## 2020-09-06 MED ORDER — DOXYCYCLINE HYCLATE 100 MG PO TABS
100.0000 mg | ORAL_TABLET | Freq: Two times a day (BID) | ORAL | 0 refills | Status: DC
Start: 2020-09-06 — End: 2021-01-20

## 2020-09-06 NOTE — Telephone Encounter (Signed)
FYI for BI  I wanted to let you know I have tested positive for COVID. I have had a video visit with Dr Renold Genta. She is putting me on an antibiotic.   So far I have had just basic symptoms - cough but no shortness of breath. I am monitoring my oxygen levels.   Ellouise Leyendecker

## 2020-09-06 NOTE — Progress Notes (Signed)
   Subjective:    Patient ID: Tamara Myers, female    DOB: Oct 29, 1955, 65 y.o.   MRN: [id number]  HPI 65 year old Female with history of sarcoidosis. Called to say she tested positive for Covid-19 on May 8th.  Patient has had 3 COVID-19 vaccinations the last 1 being October 2021.  Patient has a history of pulmonary sarcoidosis followed by Dr. Valeta Harms.  Previously treated with prednisone but has been off of this since around October.  She is on methotrexate.  Respiratory symptoms have been stable.  Had recent CT of chest July 18, 2008 showing no pleural effusion and no consolidative airspace disease or lung masses.  There was an indistinct 0.5 cm pulmonary nodule anterior right lower lobe and a new tiny 0.2 cm solid right upper lobe nodule.  Minimal patchy groundglass opacity in the peripheral right upper lobe.  Stable calcified mediastinal and bilateral hilar lymph nodes compatible with sarcoidosis.  Due to the Coronavirus pandemic, interactive audio and video telecommunication visit was undertaken.  She is agreeable to visit in this format today.  Patient is at home and I am at my office.  Patient says she has had little chance for COVID exposure except she took her mother to do early voting recently and may have had contact with someone with COVID-19 at that time.   Other medical issues include osteoporosis, essential hypertension, hyperlipidemia, GE reflux, history of thoracic compression fractures due to prolonged steroid therapy.  Also has history of elevated serum creatinine in September 2021 of 1.25 and 1.12 but was normal in January 2022.     Review of Systems see above- some nausea no vomiting     Objective:   Physical Exam  Patient seen virtually in no acute distress.  She is not tachypneic on virtual visit.  Not heard to be coughing.      Assessment & Plan:  Acute COVID-19 virus infection  Plan: Patient is to quarantine at home for minimum of 5 days.  She has been  placed on doxycycline 100 mg twice daily for 10 days.  She has no diabetes or heart disease.  She was advised to monitor pulse oximetry with a pulse oximeter device.  She used to walk some to help prevent atelectasis.  She will call if symptoms worsen.Take Zofran if needed for nausea.  Time spent with virtual visit, reviewing medical records and medical decision making plus e-scribing meds is 15 minutes.

## 2020-09-08 ENCOUNTER — Telehealth: Payer: Self-pay | Admitting: Internal Medicine

## 2020-09-08 NOTE — Telephone Encounter (Signed)
[place] results to [facility] [phone] along with demographics.  Positive as of 09/05/2020

## 2020-09-15 ENCOUNTER — Telehealth (INDEPENDENT_AMBULATORY_CARE_PROVIDER_SITE_OTHER): Payer: BC Managed Care – PPO | Admitting: Internal Medicine

## 2020-09-15 ENCOUNTER — Encounter: Payer: Self-pay | Admitting: Internal Medicine

## 2020-09-15 DIAGNOSIS — U071 COVID-19: Secondary | ICD-10-CM

## 2020-09-15 DIAGNOSIS — R059 Cough, unspecified: Secondary | ICD-10-CM

## 2020-09-15 DIAGNOSIS — D860 Sarcoidosis of lung: Secondary | ICD-10-CM | POA: Diagnosis not present

## 2020-09-15 MED ORDER — PREDNISONE 10 MG PO TABS: ORAL_TABLET | ORAL | 0 refills | Status: DC

## 2020-09-15 NOTE — Patient Instructions (Addendum)
Rest and drink fluids. Take Doxycycline as prescribed for 10 days. Monitor pulse oximetry and quarantine at home for 5 days. Call if symptoms worsen. Zofran prescribed for nausea.

## 2020-09-15 NOTE — Telephone Encounter (Addendum)
I connected by phone today with patient to check on her and follow up on her condition with  Covid-19 virus infection.    Patient has a history of pulmonary sarcoidosis followed by Dr. Valeta Harms.  Currently not on prednisone.  She was treated with doxycycline after having a  virtual visit for positive home COVID-19 test on May 11th,2022.  She says she still has protracted cough with clear to white sputum production.  No fever or chills.  Has been monitoring pulse oximetry at home, reports these readings are stable, and basically other than the cough she is doing okay.  Patient is at home and I am at my office.  She is agreeable to visit in this format today. She is identified using 2 identifiers as Tamara Myers, a patient in this practice and is agreeable to speaking with me in this format  Since her husband tested positive for COVID-19 but her mother is doing well despite some contact with her.  We discussed treating her for protracted cough with history of pulmonary sarcoidosis.  She does have history of thoracic compression fractures due to prolonged steroid therapy for sarcoidosis but is having protracted cough which would likely respond to short course of steroids.  She will be treated with prednisone 10 mg (#21) starting with 6 tabs day 1 and decreasing by 1 tab daily i.e. 6--5-4-3-2-1 taper.  She does have a Ventolin inhaler on hand which she should continue using.  This prescription was called into her local pharmacy.  She will let me know if not improving.  Time spent reviewing records speaking with patient, electronic prescribing medication, electronic documentation in medical record, and medical decision making is 10 minutes.

## 2020-09-29 DIAGNOSIS — D485 Neoplasm of uncertain behavior of skin: Secondary | ICD-10-CM | POA: Diagnosis not present

## 2020-09-29 DIAGNOSIS — L988 Other specified disorders of the skin and subcutaneous tissue: Secondary | ICD-10-CM | POA: Diagnosis not present

## 2020-09-29 DIAGNOSIS — C44619 Basal cell carcinoma of skin of left upper limb, including shoulder: Secondary | ICD-10-CM | POA: Diagnosis not present

## 2020-09-29 DIAGNOSIS — C44712 Basal cell carcinoma of skin of right lower limb, including hip: Secondary | ICD-10-CM | POA: Diagnosis not present

## 2020-10-01 DIAGNOSIS — H2512 Age-related nuclear cataract, left eye: Secondary | ICD-10-CM | POA: Diagnosis not present

## 2020-10-01 DIAGNOSIS — H2513 Age-related nuclear cataract, bilateral: Secondary | ICD-10-CM | POA: Diagnosis not present

## 2020-10-01 DIAGNOSIS — D869 Sarcoidosis, unspecified: Secondary | ICD-10-CM | POA: Diagnosis not present

## 2020-10-01 DIAGNOSIS — H35362 Drusen (degenerative) of macula, left eye: Secondary | ICD-10-CM | POA: Diagnosis not present

## 2020-10-01 DIAGNOSIS — H25013 Cortical age-related cataract, bilateral: Secondary | ICD-10-CM | POA: Diagnosis not present

## 2020-10-27 DIAGNOSIS — H25812 Combined forms of age-related cataract, left eye: Secondary | ICD-10-CM | POA: Diagnosis not present

## 2020-10-27 DIAGNOSIS — H2512 Age-related nuclear cataract, left eye: Secondary | ICD-10-CM | POA: Diagnosis not present

## 2020-10-29 HISTORY — PX: CATARACT EXTRACTION, BILATERAL: SHX1313

## 2020-11-02 DIAGNOSIS — H25041 Posterior subcapsular polar age-related cataract, right eye: Secondary | ICD-10-CM | POA: Diagnosis not present

## 2020-11-02 DIAGNOSIS — H25011 Cortical age-related cataract, right eye: Secondary | ICD-10-CM | POA: Diagnosis not present

## 2020-11-02 DIAGNOSIS — H2511 Age-related nuclear cataract, right eye: Secondary | ICD-10-CM | POA: Diagnosis not present

## 2020-11-03 ENCOUNTER — Other Ambulatory Visit: Payer: Self-pay | Admitting: Internal Medicine

## 2020-11-03 ENCOUNTER — Ambulatory Visit: Payer: BC Managed Care – PPO | Admitting: Endocrinology

## 2020-11-03 ENCOUNTER — Other Ambulatory Visit: Payer: Self-pay

## 2020-11-03 VITALS — BP 130/72 | HR 93 | Ht 64.0 in | Wt 128.0 lb

## 2020-11-03 DIAGNOSIS — M810 Age-related osteoporosis without current pathological fracture: Secondary | ICD-10-CM

## 2020-11-03 LAB — BASIC METABOLIC PANEL
BUN: 20 mg/dL (ref 6–23)
CO2: 31 mEq/L (ref 19–32)
Calcium: 10 mg/dL (ref 8.4–10.5)
Chloride: 102 mEq/L (ref 96–112)
Creatinine, Ser: 0.88 mg/dL (ref 0.40–1.20)
GFR: 69.38 mL/min (ref >60.00)
Glucose, Bld: 90 mg/dL (ref 70–99)
Potassium: 3.3 mEq/L — ABNORMAL LOW (ref 3.5–5.1)
Sodium: 142 mEq/L (ref 135–145)

## 2020-11-03 LAB — VITAMIN D 25 HYDROXY (VIT D DEFICIENCY, FRACTURES): VITD: 94.56 ng/mL (ref 30.00–100.00)

## 2020-11-03 NOTE — Patient Instructions (Addendum)
Blood tests are requested for you today.  We'll let you know about the results.   Let's recheck the bone density, after 01/26/21.  you will receive a phone call, about a day and time for an appointment.   Please come back for a follow-up appointment in 6 months.

## 2020-11-03 NOTE — Progress Notes (Signed)
Subjective:    Patient ID: Tamara Myers, female    DOB: 16-Dec-1955, 65 y.o.   MRN: [id number]  HPI Pt returns for f/u of osteoporosis: Dx'ed: 2021 Secondary cause: prednisone 2020-2021, for sarcoidosis, and Vit-D def.  Fractures:  T-spine in 2021 several fingers as a child right thumb (2015)--all with injuries Past rx: none Current rx: Reclast since 2022 Last DEXA result (2021): -2.7 (spine) Other: She takes Vit-D, 50,000 units/week, since 2021.  Interval hx: no change in chronic low back pain.  She takes vit-D as rx'ed.   Past Medical History:  Diagnosis Date   Cancer ([place])    basal cell carcinoma   Complication of anesthesia    slow to wake up   Dyspnea    on exertion   GERD (gastroesophageal reflux disease)    Hypertension    Melanoma ([place]) 2018   left upper arm   Moderate persistent asthma without complication 49/07/4965   PONV (postoperative nausea and vomiting)    Post menopausal syndrome     Past Surgical History:  Procedure Laterality Date   CESAREAN SECTION     OPEN REDUCTION INTERNAL FIXATION (ORIF) METACARPAL Right 11/24/2014   Procedure: OPEN REDUCTION INTERNAL FIXATION (ORIF) RIGHT THUMB;  Surgeon: Iran Planas, MD;  Location: [facility];  Service: Orthopedics;  Laterality: Right;   thumb surgery Right 2014   VIDEO BRONCHOSCOPY N/A 11/20/2018   Procedure: Video Bronchoscopy With Fluoro;  Surgeon: Garner Nash, DO;  Location: [facility];  Service: Thoracic;  Laterality: N/A;   VIDEO BRONCHOSCOPY WITH ENDOBRONCHIAL ULTRASOUND N/A 11/20/2018   Procedure: VIDEO BRONCHOSCOPY WITH ENDOBRONCHIAL ULTRASOUND;  Surgeon: Garner Nash, DO;  Location: [facility];  Service: Thoracic;  Laterality: N/A;    Social History   Socioeconomic History   Marital status: Married    Spouse name: Not on file   Number of children: Not on file   Years of education: Not on file   Highest education level: Not on file  Occupational History   Not on file  Tobacco Use   Smoking status:  Never   Smokeless tobacco: Never  Vaping Use   Vaping Use: Never used  Substance and Sexual Activity   Alcohol use: No   Drug use: No   Sexual activity: Not on file  Other Topics Concern   Not on file  Social History Narrative   Not on file   Social Determinants of Health   Financial Resource Strain: Not on file  Food Insecurity: Not on file  Transportation Needs: Not on file  Physical Activity: Not on file  Stress: Not on file  Social Connections: Not on file  Intimate Partner Violence: Not on file    Current Outpatient Medications on File Prior to Visit  Medication Sig Dispense Refill   albuterol (PROVENTIL HFA;VENTOLIN HFA) 108 (90 Base) MCG/ACT inhaler Inhale 2 puffs into the lungs every 6 (six) hours as needed for wheezing or shortness of breath. 18 g 1   amLODipine (NORVASC) 5 MG tablet TAKE 1 TABLET BY MOUTH EVERY DAY 90 tablet 3   cholecalciferol (VITAMIN D3) 10 MCG (400 UNIT) TABS tablet Take 400 Units by mouth 2 (two) times daily.     doxycycline (VIBRA-TABS) 100 MG tablet Take 1 tablet (100 mg total) by mouth 2 (two) times daily. 20 tablet 0   Fluticasone-Salmeterol (ADVAIR DISKUS) 100-50 MCG/DOSE AEPB Inhale 1 puff into the lungs 2 (two) times daily. 60 each 4   folic acid (FOLVITE) 1 MG tablet  Take 1 tablet (1 mg total) by mouth daily. 90 tablet 1   methotrexate (RHEUMATREX) 10 MG tablet Take 10 mg by mouth once a week. Caution: Chemotherapy. Protect from light.     ondansetron (ZOFRAN) 4 MG tablet Take 1 tablet (4 mg total) by mouth every 8 (eight) hours as needed for nausea or vomiting. 20 tablet 0   pantoprazole (PROTONIX) 40 MG tablet Take 1 tablet (40 mg total) by mouth daily. 90 tablet 3   predniSONE (DELTASONE) 10 MG tablet Take in tapering course as directed for protracted cough 6-5-4-3-2-1 21 tablet 0   rosuvastatin (CRESTOR) 10 MG tablet TAKE 1 TABLET (10 MG TOTAL) BY MOUTH DAILY AT 6 PM. 90 tablet 3   No current facility-administered medications on file  prior to visit.    No Known Allergies  Family History  Problem Relation Age of Onset   Osteoporosis Mother    Diabetes Father    Heart disease Father    Hyperlipidemia Father    Hypertension Father    Pulmonary fibrosis Father    Asthma Daughter    Breast cancer Neg Hx    Allergic rhinitis Neg Hx    Eczema Neg Hx    Urticaria Neg Hx    Angioedema Neg Hx     BP 130/72 (BP Location: Right Arm, Patient Position: Sitting, Cuff Size: Normal)   Pulse 93   Ht 5\' 4"  (1.626 m)   Wt 128 lb (58.1 kg)   SpO2 98%   BMI 21.97 kg/m    Review of Systems denies numbness and muscle cramps.     Objective:   Physical Exam GAIT: normal and steady.   25-OH Vit-D=94    Assessment & Plan:  Osteoporosis, due for recheck soon.   vitamin-D def: overcontrolled: reduce to 4000 units per day.   Patient Instructions  Blood tests are requested for you today.  We'll let you know about the results.   Let's recheck the bone density, after 01/26/21.  you will receive a phone call, about a day and time for an appointment.   Please come back for a follow-up appointment in 6 months.

## 2020-11-04 LAB — PTH, INTACT AND CALCIUM
Calcium: 10.1 mg/dL (ref 8.6–10.4)
PTH: 19 pg/mL (ref 16–77)

## 2020-11-10 ENCOUNTER — Telehealth: Payer: Self-pay

## 2020-11-10 DIAGNOSIS — H25041 Posterior subcapsular polar age-related cataract, right eye: Secondary | ICD-10-CM | POA: Diagnosis not present

## 2020-11-10 DIAGNOSIS — H25011 Cortical age-related cataract, right eye: Secondary | ICD-10-CM | POA: Diagnosis not present

## 2020-11-10 DIAGNOSIS — H25811 Combined forms of age-related cataract, right eye: Secondary | ICD-10-CM | POA: Diagnosis not present

## 2020-11-10 DIAGNOSIS — H2511 Age-related nuclear cataract, right eye: Secondary | ICD-10-CM | POA: Diagnosis not present

## 2020-11-10 MED ORDER — ADVAIR HFA 115-21 MCG/ACT IN AERO: 2.0000 | INHALATION_SPRAY | Freq: Two times a day (BID) | RESPIRATORY_TRACT | 12 refills | Status: DC

## 2020-11-10 NOTE — Telephone Encounter (Signed)
Refill request sent from pharmacy for Advair 100-50 Diskus. I do not see where patient has tried any of the other inhalers on the preferred list. Do you want to change to a different inhaler or should we start a PA?  Dr. Valeta Harms please advise  Thank you

## 2020-11-10 NOTE — Addendum Note (Signed)
Addended by: Abigail Miyamoto on: 11/10/2020 04:59 PM   Modules accepted: Orders

## 2020-11-10 NOTE — Telephone Encounter (Signed)
Called and spoke with the patient regarding her Advair 100-50 Diskus not being covered by her insurance. Advised her that Dr. Valeta Harms would like to switch her to Advair HFA 115-21 mcg. Patient [place] in agreement. Patient verbalized understanding. Inhaler has been sent into pharmacy, patient is aware.  Nothing further needed at this time.

## 2020-12-09 ENCOUNTER — Encounter: Payer: Self-pay | Admitting: Internal Medicine

## 2020-12-11 ENCOUNTER — Other Ambulatory Visit: Payer: Self-pay | Admitting: *Deleted

## 2020-12-11 MED ORDER — METHOTREXATE SODIUM 10 MG PO TABS: 10.0000 mg | ORAL_TABLET | ORAL | 6 refills | Status: DC

## 2020-12-31 ENCOUNTER — Other Ambulatory Visit: Payer: Self-pay | Admitting: Internal Medicine

## 2020-12-31 DIAGNOSIS — Z1231 Encounter for screening mammogram for malignant neoplasm of breast: Secondary | ICD-10-CM

## 2021-01-07 ENCOUNTER — Other Ambulatory Visit: Payer: Self-pay

## 2021-01-07 ENCOUNTER — Other Ambulatory Visit: Payer: BC Managed Care – PPO | Admitting: Internal Medicine

## 2021-01-07 DIAGNOSIS — Z0000 Encounter for general adult medical examination without abnormal findings: Secondary | ICD-10-CM

## 2021-01-07 DIAGNOSIS — E7800 Pure hypercholesterolemia, unspecified: Secondary | ICD-10-CM

## 2021-01-07 DIAGNOSIS — I10 Essential (primary) hypertension: Secondary | ICD-10-CM | POA: Diagnosis not present

## 2021-01-08 LAB — COMPLETE METABOLIC PANEL WITH GFR
AG Ratio: 2.9 (calc) — ABNORMAL HIGH (ref 1.0–2.5)
ALT: 23 U/L (ref 6–29)
AST: 25 U/L (ref 10–35)
Albumin: 4.6 g/dL (ref 3.6–5.1)
Alkaline phosphatase (APISO): 51 U/L (ref 37–153)
BUN: 14 mg/dL (ref 7–25)
CO2: 31 mmol/L (ref 20–32)
Calcium: 9.7 mg/dL (ref 8.6–10.4)
Chloride: 102 mmol/L (ref 98–110)
Creat: 0.85 mg/dL (ref 0.50–1.05)
Globulin: 1.6 g/dL (calc) — ABNORMAL LOW (ref 1.9–3.7)
Glucose, Bld: 90 mg/dL (ref 65–99)
Potassium: 3.7 mmol/L (ref 3.5–5.3)
Sodium: 140 mmol/L (ref 135–146)
Total Bilirubin: 0.6 mg/dL (ref 0.2–1.2)
Total Protein: 6.2 g/dL (ref 6.1–8.1)
eGFR: 76 mL/min/{1.73_m2} (ref >=60)

## 2021-01-08 LAB — CBC WITH DIFFERENTIAL/PLATELET
Absolute Monocytes: 377 cells/uL (ref 200–950)
Basophils Absolute: 19 cells/uL (ref 0–200)
Basophils Relative: 0.5 %
Eosinophils Absolute: 100 cells/uL (ref 15–500)
Eosinophils Relative: 2.7 %
HCT: 38.4 % (ref 35.0–45.0)
Hemoglobin: 13 g/dL (ref 11.7–15.5)
Lymphs Abs: 429 cells/uL — ABNORMAL LOW (ref 850–3900)
MCH: 31.6 pg (ref 27.0–33.0)
MCHC: 33.9 g/dL (ref 32.0–36.0)
MCV: 93.4 fL (ref 80.0–100.0)
MPV: 11 fL (ref 7.5–12.5)
Monocytes Relative: 10.2 %
Neutro Abs: 2775 cells/uL (ref 1500–7800)
Neutrophils Relative %: 75 %
Platelets: 240 10*3/uL (ref 140–400)
RBC: 4.11 10*6/uL (ref 3.80–5.10)
RDW: 12.1 % (ref 11.0–15.0)
Total Lymphocyte: 11.6 %
WBC: 3.7 10*3/uL — ABNORMAL LOW (ref 3.8–10.8)

## 2021-01-08 LAB — LIPID PANEL
Cholesterol: 161 mg/dL (ref <200)
HDL: 82 mg/dL (ref >=50)
LDL Cholesterol (Calc): 66 mg/dL (calc)
Non-HDL Cholesterol (Calc): 79 mg/dL (calc) (ref <130)
Total CHOL/HDL Ratio: 2 (calc) (ref <5.0)
Triglycerides: 54 mg/dL (ref <150)

## 2021-01-08 LAB — TSH: TSH: 1.04 mIU/L (ref 0.40–4.50)

## 2021-01-10 ENCOUNTER — Ambulatory Visit (INDEPENDENT_AMBULATORY_CARE_PROVIDER_SITE_OTHER): Payer: BC Managed Care – PPO | Admitting: Internal Medicine

## 2021-01-10 ENCOUNTER — Other Ambulatory Visit: Payer: Self-pay

## 2021-01-10 ENCOUNTER — Encounter: Payer: Self-pay | Admitting: Internal Medicine

## 2021-01-10 VITALS — BP 112/76 | HR 77 | Ht 64.0 in | Wt 128.0 lb

## 2021-01-10 DIAGNOSIS — E7800 Pure hypercholesterolemia, unspecified: Secondary | ICD-10-CM

## 2021-01-10 DIAGNOSIS — D860 Sarcoidosis of lung: Secondary | ICD-10-CM | POA: Diagnosis not present

## 2021-01-10 DIAGNOSIS — Z8616 Personal history of COVID-19: Secondary | ICD-10-CM

## 2021-01-10 DIAGNOSIS — E559 Vitamin D deficiency, unspecified: Secondary | ICD-10-CM | POA: Diagnosis not present

## 2021-01-10 DIAGNOSIS — Z92241 Personal history of systemic steroid therapy: Secondary | ICD-10-CM

## 2021-01-10 DIAGNOSIS — I10 Essential (primary) hypertension: Secondary | ICD-10-CM

## 2021-01-10 DIAGNOSIS — Z0000 Encounter for general adult medical examination without abnormal findings: Secondary | ICD-10-CM

## 2021-01-10 DIAGNOSIS — S22070D Wedge compression fracture of T9-T10 vertebra, subsequent encounter for fracture with routine healing: Secondary | ICD-10-CM

## 2021-01-10 DIAGNOSIS — Z87311 Personal history of (healed) other pathological fracture: Secondary | ICD-10-CM

## 2021-01-10 DIAGNOSIS — Z8719 Personal history of other diseases of the digestive system: Secondary | ICD-10-CM

## 2021-01-10 DIAGNOSIS — S22080D Wedge compression fracture of T11-T12 vertebra, subsequent encounter for fracture with routine healing: Secondary | ICD-10-CM

## 2021-01-10 DIAGNOSIS — Z23 Encounter for immunization: Secondary | ICD-10-CM | POA: Diagnosis not present

## 2021-01-10 LAB — POCT URINALYSIS DIPSTICK
Bilirubin, UA: NEGATIVE
Blood, UA: NEGATIVE
Glucose, UA: NEGATIVE
Ketones, UA: NEGATIVE
Leukocytes, UA: NEGATIVE
Nitrite, UA: NEGATIVE
Protein, UA: NEGATIVE
Spec Grav, UA: 1.01 (ref 1.010–1.025)
Urobilinogen, UA: 0.2 E.U./dL
pH, UA: 5 (ref 5.0–8.0)

## 2021-01-10 NOTE — Progress Notes (Signed)
Subjective:    Patient ID: Tamara Myers, female    DOB: [birth year], 65 y.o.   MRN: [id number]  HPI 65 year old Female seen for health maintenance exam and evaluation of medical issues.  History of essential hypertension and fibrocystic breast disease.  Was diagnosed with sarcoidosis in 2020.  Is being followed by Dr. Juleen Myers.  Was on prednisone for a number of months and this has been tapered and discontinued.  Now on methotrexate.  Also uses in addition to albuterol inhaler and Advair inhaler 115/21 2 puffs twice daily.  History of hyperlipidemia treated with Crestor 10 mg daily  History of hypertension treated with Maxide 25 and Norvasc 5 mg daily.  History of GE reflux treated with Protonix    Open reduction and internal fixation of right thumb January 2016 by Dr. Apolonio Myers.  This was due to right thumb metacarpal shaft fracture.  No known drug allergies.  History of dysplastic nevus right leg treated by Dr. Sarajane Myers, Mohs surgeon.  Atypical nevus removed from arm and a basal cell carcinoma removed in the past as well.  Past medical history: Became menopausal in 2000.  She has had 2 cesarean sections in the past.  Fractured right fifth metacarpal.  Fracture left fifth finger at age 37.  History of right knee pain and has seen orthopedist in 2011 at which time she received an injection of Xylocaine and Aristospan.  History of right breast aspiration in May 2012.  Social history: She is married with 2 adult children.  Does not smoke or consume alcohol.  She works for the [employer].  Family history: Father with history of CABG, diabetes, pulm syndrome, hyperlipidemia, hypertension died from respiratory failure.  He had pulmonary fibrosis.  Mother with history of Mnire's disease, macular degeneration and remote history of melanoma.  Sarcoidosis was discovered allergist when she was referred there through this office for protracted cough.  She had a CT scan during allergy  evaluation which was abnormal.  At that time had been coughing for some 12 months and had no improvement with Protonix and Zantac.  ENT has seen her as well.  She had bronchoscopy in July 2020.  She had mediastinal adenopathy on chest CT.  Had transbronchial needle aspiration biopsies.  Cultures were obtained.  No malignant cells identified.  Biopsy showed granulomatous inflammation consistent with sarcoidosis.  Cultures were negative for AFB and fungus.  Cough has improved.  She has osteoporosis diagnosed in 2021 followed by Dr. Loanne Myers and is on Reclast.  Also takes vitamin D supplementation.  She is postmenopausal and is a thin white female but prednisone therapy likely exacerbated her osteoporosis.  Review of Systems currently pulmonary symptoms are stable and she has no other complaints     Objective:   Physical Exam Blood pressure 112/76 pulse 77 pulse oximetry 97% weight 128 pounds BMI 21.97  Skin warm and dry.  No cervical adenopathy.  No thyromegaly.  Chest is clear.  Cardiac exam: Regular rate and rhythm without ectopy.  Breasts are without masses.  Cardiac exam: Regular rate and rhythm without ectopy.  Abdomen is soft nondistended without hepatosplenomegaly masses or tenderness.  Neuro is intact without focal deficits.  Affect thought and judgment are normal.       Assessment & Plan:  Osteoporosis treated with Reclast by Dr. Loanne Myers  History of sarcoidosis followed by Tamara Myers which required considerable steroids to control and lead to osteoporosis and thoracic compression fracture  Hyperlipidemia-stable on statin medication  GE reflux treated with PPI  Essential hypertension stable on Maxide 25 and amlodipine   History of Covid-16 Sep 2020  Health maintenance: Have Covid booster and flu vaccine. Needs colonoscopy. Have annual mammogram. RTC on one year or as needed. Pneumococcal 20 given.

## 2021-01-20 ENCOUNTER — Other Ambulatory Visit: Payer: Self-pay

## 2021-01-20 ENCOUNTER — Ambulatory Visit: Payer: BC Managed Care – PPO | Admitting: Pulmonary Disease

## 2021-01-20 ENCOUNTER — Encounter: Payer: Self-pay | Admitting: Pulmonary Disease

## 2021-01-20 VITALS — BP 128/80 | HR 77 | Temp 98.1°F | Ht 64.0 in | Wt 130.4 lb

## 2021-01-20 DIAGNOSIS — R059 Cough, unspecified: Secondary | ICD-10-CM

## 2021-01-20 DIAGNOSIS — D869 Sarcoidosis, unspecified: Secondary | ICD-10-CM

## 2021-01-20 DIAGNOSIS — Z5181 Encounter for therapeutic drug level monitoring: Secondary | ICD-10-CM

## 2021-01-20 DIAGNOSIS — J4540 Moderate persistent asthma, uncomplicated: Secondary | ICD-10-CM

## 2021-01-20 DIAGNOSIS — J849 Interstitial pulmonary disease, unspecified: Secondary | ICD-10-CM

## 2021-01-20 DIAGNOSIS — Z79899 Other long term (current) drug therapy: Secondary | ICD-10-CM

## 2021-01-20 DIAGNOSIS — Z79631 Long term (current) use of antimetabolite agent: Secondary | ICD-10-CM

## 2021-01-20 MED ORDER — METHOTREXATE SODIUM 7.5 MG PO TABS: 7.5000 mg | ORAL_TABLET | ORAL | 0 refills | Status: DC

## 2021-01-20 NOTE — Patient Instructions (Signed)
Thank you for visiting Dr. Valeta Harms at [facility]. Today we recommend the following:  MTX dosing 7.5mg  weekly for 3 months  Then drop to 5mg  weekly  See me in 6 months and we can possibly discontinue   Return in about 6 months (around 07/20/2021) for with APP or Dr. Valeta Harms.    Please do your part to reduce the spread of COVID-19.

## 2021-01-20 NOTE — Progress Notes (Signed)
Synopsis: Referred in March 2020 for abnormal CT of the chest by Elby Showers, MD  Subjective:   PATIENT ID: Tamara Myers GENDER: female DOB: 17-Jul-1955, MRN: [id number]  Chief Complaint  Patient presents with   Follow-up    6 mo f/u. States she has started to cough again. Non-productive cough. Has noticed that she has been getting SOB with exertion.      Patient has had complaints of progressive cough for the past year.  She was recently placed on a PPI for treatment of reflux.  She was ultimately referred to allergy.  She saw Dr. Maudie Mercury which evaluated for her cough.  She had a chest x-ray which revealed interstitial markings.  She ultimately had a noncontrast CT of the chest which revealed perilymphatic micro-nodularity consistent with pulmonary sarcoidosis with small areas of bilateral hilar adenopathy and a right-sided 1.4 cm hilar lymph node.  She had a dominant right-sided right lower lobe nodule that measured 1 cm in size also likely secondary to sarcoid however recommending a 3 to 58-month follow-up.  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  No family history of sarcoidosis.  Father died of pulmonary fibrosis.  Patient has not had any eye or skin symptoms.  OV 03/12/2019:Patient underwent bronchoscopy November 20, 2018.  Bronchoscopy tissue biopsy revealed granulomatous inflammation.  Follow-up visit was via telephone.  In August 2020.  She was then placed on a 58-month taper of prednisone with PCP prophylaxis of Bactrim.  Patient here today for follow-up after recent diagnosis of sarcoidosis.  Overall patient doing well today.  She recently completed her 46-month taper of prednisone.  Over the past 2 weeks she has noticed increase in her cough.  Today reviewed her CT scan which was completed getting of week which revealed improvement of the nodularity and parenchymal  changes consistent with sarcoid however they are still present.  She still has dry cough.  OV 04/17/2019: Patient here today for follow-up after starting methotrexate and restarting her oral prednisone for sarcoidosis.  Doing well today.  Respiratory status is much improved as to previous.  She does not have significant cough anymore.  She feels a little bit better when it comes to her dyspnea on exertion but is still about the same.  Occasionally has cough but not much.  She has been faithful with taking her medication.  She has uptitrated methotrexate to 10 mg daily.  She is planned for labs today.  She overall denies hemoptysis weight loss, fevers chills night sweats.  OV 06/25/2019: Respiratory symptoms stable at this time.  She does have some exertional dyspnea when she does yoga in the evenings.  Otherwise she is able to complete most activities of daily living.  Still trying to stay social distance.  She has noticed some puffiness in her cheeks that she feels may be related to fluid retention from her steroids.  She has noticed easy bruising as well.  Also right eye had a subconjunctival hemorrhage which was new that occurred spontaneously.  Patient denies hemoptysis fevers chills night sweats weight loss.  OV 12/04/2019: Patient here today for follow-up of sarcoidosis. Otherwise doing well with no symptoms at this time. Taking 10 mg of prednisone today and 10 mg weekly of methotrexate. She has noticed some weight gain. She does feel like her face is a little bit more puffy. We did review her CT  imaging that was completed and March 2021 which showed improvement. She has had 2 subsequent follow-ups with Wyn Quaker, NP since last time I saw her in the office.   OV 03/17/2020: Here today for follow-up of sarcoidosis and immunosuppression.  Currently managed with prednisone and methotrexate.  After last office visit and plans were successfully tapering from prednisone.  Overall patient doing very well today  from a respiratory standpoint.  She is slowly recovering from her thoracic compression fractures.  She is off prednisone at this time maintenance dose of methotrexate.  Patient did have pulmonary function test that were completed prior to today's office visit which revealed postbronchodilator normal ratio, FVC of 79, FEV1 80%, TLC 87% predicted, DLCO 101%.  Stable in comparison.  Results reviewed today and interpreted in the office.  With patient.  OV 07/28/2020: off prednisone around October. Not quite off for 6 months. Still maintained on MTX.  At this point her respiratory symptoms are stable.  She recently had blood work at the beginning of the year which also appears stable.  Serum creatinine reviewed as well as AST ALT within normal limits.  She is here today for follow-up after recent CT scan of the chest completed March 2022 which reveals resolution of her parenchymal abnormalities of sarcoidosis.  She has calcified mediastinal nodes.  OV 01/20/2021: Here today for follow-up regarding sarcoidosis.  Currently maintained on methotrexate.  From respiratory standpoint doing well.  She did have COVID.  Was treated outpatient.  Had recent lab work completed at primary care office.  Also followed by endocrinology.  Patient denies cough sputum production shortness of breath with exertion.    Past Medical History:  Diagnosis Date   Cancer ([place])    basal cell carcinoma   Complication of anesthesia    slow to wake up   Dyspnea    on exertion   GERD (gastroesophageal reflux disease)    Hypertension    Melanoma ([place]) 2018   left upper arm   Moderate persistent asthma without complication 17/07/812   PONV (postoperative nausea and vomiting)    Post menopausal syndrome      Family History  Problem Relation Age of Onset   Osteoporosis Mother    Diabetes Father    Heart disease Father    Hyperlipidemia Father    Hypertension Father    Pulmonary fibrosis Father    Asthma Daughter    Breast  cancer Neg Hx    Allergic rhinitis Neg Hx    Eczema Neg Hx    Urticaria Neg Hx    Angioedema Neg Hx      Past Surgical History:  Procedure Laterality Date   CESAREAN SECTION     OPEN REDUCTION INTERNAL FIXATION (ORIF) METACARPAL Right 11/24/2014   Procedure: OPEN REDUCTION INTERNAL FIXATION (ORIF) RIGHT THUMB;  Surgeon: Iran Planas, MD;  Location: [place];  Service: Orthopedics;  Laterality: Right;   thumb surgery Right 2014   VIDEO BRONCHOSCOPY N/A 11/20/2018   Procedure: Video Bronchoscopy With Fluoro;  Surgeon: Garner Nash, DO;  Location: [facility];  Service: Thoracic;  Laterality: N/A;   VIDEO BRONCHOSCOPY WITH ENDOBRONCHIAL ULTRASOUND N/A 11/20/2018   Procedure: VIDEO BRONCHOSCOPY WITH ENDOBRONCHIAL ULTRASOUND;  Surgeon: Garner Nash, DO;  Location: [facility];  Service: Thoracic;  Laterality: N/A;    Social History   Socioeconomic History   Marital status: Married    Spouse name: Not on file   Number of children: Not on file   Years of education:  Not on file   Highest education level: Not on file  Occupational History   Not on file  Tobacco Use   Smoking status: Never   Smokeless tobacco: Never  Vaping Use   Vaping Use: Never used  Substance and Sexual Activity   Alcohol use: No   Drug use: No   Sexual activity: Not on file  Other Topics Concern   Not on file  Social History Narrative   Not on file   Social Determinants of Health   Financial Resource Strain: Not on file  Food Insecurity: Not on file  Transportation Needs: Not on file  Physical Activity: Not on file  Stress: Not on file  Social Connections: Not on file  Intimate Partner Violence: Not on file     No Known Allergies   Outpatient Medications Prior to Visit  Medication Sig Dispense Refill   albuterol (PROVENTIL HFA;VENTOLIN HFA) 108 (90 Base) MCG/ACT inhaler Inhale 2 puffs into the lungs every 6 (six) hours as needed for wheezing or shortness of breath. 18 g 1   amLODipine (NORVASC) 5 MG tablet  TAKE 1 TABLET BY MOUTH EVERY DAY 90 tablet 3   cholecalciferol (VITAMIN D3) 10 MCG (400 UNIT) TABS tablet Take 400 Units by mouth 2 (two) times daily.     fluticasone-salmeterol (ADVAIR HFA) 115-21 MCG/ACT inhaler Inhale 2 puffs into the lungs 2 (two) times daily. 12 g 12   folic acid (FOLVITE) 1 MG tablet Take 1 tablet (1 mg total) by mouth daily. 90 tablet 1   pantoprazole (PROTONIX) 40 MG tablet Take 1 tablet (40 mg total) by mouth daily. 90 tablet 3   rosuvastatin (CRESTOR) 10 MG tablet TAKE 1 TABLET (10 MG TOTAL) BY MOUTH DAILY AT 6 PM. 90 tablet 3   triamterene-hydrochlorothiazide (MAXZIDE-25) 37.5-25 MG tablet TAKE 1 TABLET BY MOUTH EVERY DAY 90 tablet 2   methotrexate (RHEUMATREX) 10 MG tablet Take 1 tablet (10 mg total) by mouth once a week. Caution: Chemotherapy. Protect from light. 4 tablet 6   doxycycline (VIBRA-TABS) 100 MG tablet Take 1 tablet (100 mg total) by mouth 2 (two) times daily. 20 tablet 0   ondansetron (ZOFRAN) 4 MG tablet Take 1 tablet (4 mg total) by mouth every 8 (eight) hours as needed for nausea or vomiting. 20 tablet 0   predniSONE (DELTASONE) 10 MG tablet Take in tapering course as directed for protracted cough 6-5-4-3-2-1 21 tablet 0   No facility-administered medications prior to visit.    Review of Systems  Constitutional:  Negative for chills, fever, malaise/fatigue and weight loss.  HENT:  Negative for hearing loss, sore throat and tinnitus.   Eyes:  Negative for blurred vision and double vision.  Respiratory:  Negative for cough, hemoptysis, sputum production, shortness of breath, wheezing and stridor.   Cardiovascular:  Negative for chest pain, palpitations, orthopnea, leg swelling and PND.  Gastrointestinal:  Negative for abdominal pain, constipation, diarrhea, heartburn, nausea and vomiting.  Genitourinary:  Negative for dysuria, hematuria and urgency.  Musculoskeletal:  Negative for joint pain and myalgias.  Skin:  Negative for itching and rash.   Neurological:  Negative for dizziness, tingling, weakness and headaches.  Endo/Heme/Allergies:  Negative for environmental allergies. Does not bruise/bleed easily.  Psychiatric/Behavioral:  Negative for depression. The patient is not nervous/anxious and does not have insomnia.   All other systems reviewed and are negative.   Objective:  Physical Exam Vitals reviewed.  Constitutional:      General: She is not in acute  distress.    Appearance: She is well-developed.  HENT:     Head: Normocephalic and atraumatic.  Eyes:     General: No scleral icterus.    Conjunctiva/sclera: Conjunctivae normal.     Pupils: Pupils are equal, round, and reactive to light.  Neck:     Vascular: No JVD.     Trachea: No tracheal deviation.  Cardiovascular:     Rate and Rhythm: Normal rate and regular rhythm.     Heart sounds: Normal heart sounds. No murmur heard. Pulmonary:     Effort: Pulmonary effort is normal. No tachypnea, accessory muscle usage or respiratory distress.     Breath sounds: Normal breath sounds. No stridor. No wheezing, rhonchi or rales.  Abdominal:     General: Bowel sounds are normal. There is no distension.     Palpations: Abdomen is soft.     Tenderness: There is no abdominal tenderness.  Musculoskeletal:        General: No tenderness.     Cervical back: Neck supple.  Lymphadenopathy:     Cervical: No cervical adenopathy.  Skin:    General: Skin is warm and dry.     Capillary Refill: Capillary refill takes less than 2 seconds.     Findings: No rash.  Neurological:     Mental Status: She is alert and oriented to person, place, and time.  Psychiatric:        Behavior: Behavior normal.     Vitals:   01/20/21 1553  BP: 128/80  Pulse: 77  Temp: 98.1 F (36.7 C)  TempSrc: Oral  SpO2: 99%  Weight: 130 lb 6.4 oz (59.1 kg)  Height: 5\' 4"  (1.626 m)    99% on RA BMI Readings from Last 3 Encounters:  01/20/21 22.38 kg/m  01/10/21 21.97 kg/m  11/03/20 21.97 kg/m    Wt Readings from Last 3 Encounters:  01/20/21 130 lb 6.4 oz (59.1 kg)  01/10/21 128 lb (58.1 kg)  11/03/20 128 lb (58.1 kg)     CBC    Component Value Date/Time   WBC 3.7 (L) 01/07/2021 0913   RBC 4.11 01/07/2021 0913   HGB 13.0 01/07/2021 0913   HCT 38.4 01/07/2021 0913   PLT 240 01/07/2021 0913   MCV 93.4 01/07/2021 0913   MCV 88.1 07/29/2011 1126   MCH 31.6 01/07/2021 0913   MCHC 33.9 01/07/2021 0913   RDW 12.1 01/07/2021 0913   LYMPHSABS 429 (L) 01/07/2021 0913   MONOABS 0.3 10/23/2019 1509   EOSABS 100 01/07/2021 0913   BASOSABS 19 01/07/2021 0913    Chest Imaging: CT chest 07/03/2018: Evidence of peri-lymphatic bilateral micro-nodularity consistent with sarcoidosis, small mediastinal and hilar lymph nodes.  And a right lower lobe 1 cm nodule likely consistent with the sarcoid however recommending 3 to 69-month follow-up by radiology. The patient's images have been independently reviewed by me.    November 2020: CT chest with perilymphatic bilateral micronodularity which is improved in comparison to her previous imaging. The patient's images have been independently reviewed by me.    High-resolution CT scan of the chest 06/30/2019: Positive response to therapy some improvement of parenchymal changes. The patient's images have been independently reviewed by me.    HRCT 06/2020: Resolution of parenchymal abnormalities can seen in the past related to sarcoidosis.  She has subcentimeter pulmonary nodules 1 within the right lower lobe and a 0.2 cm nodule in the right upper lobe.  She has stable calcified mediastinal and bilateral hilar nodes. The patient's  images have been independently reviewed by me.    Pulmonary Functions Testing Results: PFT Results Latest Ref Rng & Units 03/17/2020 11/06/2018 06/27/2018  FVC-Pre L 2.51 2.86 2.82  FVC-Predicted Pre % 79 88 86  FVC-Post L 2.53 2.92 2.82  FVC-Predicted Post % 79 89 86  Pre FEV1/FVC % % 65 74 65  Post FEV1/FCV % % 77 70  76  FEV1-Pre L 1.64 2.13 1.83  FEV1-Predicted Pre % 67 85 73  FEV1-Post L 1.96 2.04 2.13  DLCO uncorrected ml/min/mmHg 20.04 20.13 20.01  DLCO UNC% % 101 100 99  DLCO corrected ml/min/mmHg 20.04 - -  DLCO COR %Predicted % 101 - -  DLVA Predicted % 119 124 109  TLC L 4.34 4.74 4.57  TLC % Predicted % 87 93 90  RV % Predicted % 83 94 80     FeNO: None   Pathology:   Bronchoscopy July 2020: Lung, biopsy, right middle lobe - BENIGN LUNG PARENCHYMA WITH GRANULOMATOUS INFLAMMATION. - THERE IS NO EVIDENCE OF MALIGNANCY.  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  She is approximately been on immune suppression for around 2 years.  She was on prednisone tapered to methotrexate.  She had a negative MRI of the heart for any cardiac involvement.  Plan: Since she has received a little more than 18 months of immune suppression I think we can consider starting tapering. Her CT scan of the chest is reassuring that was completed in March of this past year with already noted starting to calcify. I think we can start tapering her methotrexate. Start 7.5 mg weekly for the next 3 months, drop down to 5 mg weekly after that. Stay on 5 mg weekly until seeing me in 6 months from now. If she has any change in her symptoms would recommend her to give [place] a call and we can reassess.    Current Outpatient Medications:    albuterol (PROVENTIL HFA;VENTOLIN HFA) 108 (90 Base) MCG/ACT inhaler, Inhale 2 puffs into the lungs every 6 (six) hours as needed for wheezing or shortness of breath., Disp: 18 g, Rfl: 1   amLODipine (NORVASC) 5 MG tablet, TAKE 1  TABLET BY MOUTH EVERY DAY, Disp: 90 tablet, Rfl: 3   cholecalciferol (VITAMIN D3) 10 MCG (400 UNIT) TABS tablet, Take 400 Units by mouth 2 (two) times daily., Disp: , Rfl:    fluticasone-salmeterol (ADVAIR HFA) 115-21 MCG/ACT inhaler, Inhale 2 puffs into the lungs 2 (two) times daily., Disp: 12 g, Rfl: 12   folic acid (FOLVITE) 1 MG tablet, Take 1 tablet (1 mg total) by mouth daily., Disp: 90 tablet, Rfl: 1   methotrexate (RHEUMATREX) 7.5 MG tablet, Take 1 tablet (7.5 mg total) by mouth once a week. Caution" Chemotherapy. Protect from light., Disp: 12 tablet, Rfl: 0   pantoprazole (PROTONIX) 40 MG tablet, Take 1 tablet (40 mg total) by mouth daily., Disp: 90 tablet, Rfl: 3   rosuvastatin (CRESTOR) 10 MG tablet, TAKE 1 TABLET (10 MG TOTAL) BY MOUTH DAILY AT 6 PM., Disp: 90 tablet, Rfl: 3   triamterene-hydrochlorothiazide (MAXZIDE-25) 37.5-25 MG tablet, TAKE 1 TABLET BY MOUTH EVERY DAY, Disp: 90 tablet, Rfl: 2  I spent 32  minutes dedicated to the care of this patient on the date of this encounter to include pre-visit review of records, face-to-face time with the patient discussing conditions above, post visit ordering of testing, clinical documentation with the electronic health record, making appropriate referrals as documented, and communicating necessary findings to members of the patients care team.    Garner Nash, DO [facility] 01/20/2021 4:42 PM

## 2021-01-26 ENCOUNTER — Telehealth: Payer: Self-pay | Admitting: Pulmonary Disease

## 2021-01-26 NOTE — Telephone Encounter (Signed)
BI pt would like to change back to the previous dose of her methotrexate.  She stated that the insurance will not cover the new dose.  Pt would also like to change her pharmacy and I have updated this in the computer.  BI please advise. Thanks

## 2021-01-27 MED ORDER — METHOTREXATE 2.5 MG PO TABS: ORAL_TABLET | ORAL | 1 refills | Status: DC

## 2021-01-27 NOTE — Addendum Note (Signed)
Addended by: Elie Confer on: 01/27/2021 10:01 AM   Modules accepted: Orders

## 2021-01-27 NOTE — Telephone Encounter (Signed)
I have called the pt and she is aware of rx that has been sent to the pharmacy for the 2.5 and she is aware of the tapering dose per [facility]

## 2021-02-03 ENCOUNTER — Ambulatory Visit
Admission: RE | Admit: 2021-02-03 | Discharge: 2021-02-03 | Disposition: A | Discharge status: Home / Self Care | Payer: BC Managed Care – PPO | Source: Ambulatory Visit | Attending: Internal Medicine | Admitting: Internal Medicine

## 2021-02-03 ENCOUNTER — Other Ambulatory Visit: Payer: Self-pay

## 2021-02-03 DIAGNOSIS — Z1231 Encounter for screening mammogram for malignant neoplasm of breast: Secondary | ICD-10-CM

## 2021-02-07 ENCOUNTER — Encounter: Payer: Self-pay | Admitting: Internal Medicine

## 2021-02-07 DIAGNOSIS — D485 Neoplasm of uncertain behavior of skin: Secondary | ICD-10-CM | POA: Diagnosis not present

## 2021-02-07 DIAGNOSIS — L820 Inflamed seborrheic keratosis: Secondary | ICD-10-CM | POA: Diagnosis not present

## 2021-02-07 DIAGNOSIS — Z85828 Personal history of other malignant neoplasm of skin: Secondary | ICD-10-CM | POA: Diagnosis not present

## 2021-02-07 DIAGNOSIS — D2262 Melanocytic nevi of left upper limb, including shoulder: Secondary | ICD-10-CM | POA: Diagnosis not present

## 2021-02-07 DIAGNOSIS — L570 Actinic keratosis: Secondary | ICD-10-CM | POA: Diagnosis not present

## 2021-02-07 DIAGNOSIS — D225 Melanocytic nevi of trunk: Secondary | ICD-10-CM | POA: Diagnosis not present

## 2021-02-07 DIAGNOSIS — B078 Other viral warts: Secondary | ICD-10-CM | POA: Diagnosis not present

## 2021-02-07 DIAGNOSIS — L821 Other seborrheic keratosis: Secondary | ICD-10-CM | POA: Diagnosis not present

## 2021-02-07 DIAGNOSIS — D2271 Melanocytic nevi of right lower limb, including hip: Secondary | ICD-10-CM | POA: Diagnosis not present

## 2021-02-08 HISTORY — PX: OTHER SURGICAL HISTORY: SHX169

## 2021-02-23 NOTE — Patient Instructions (Signed)
It was a pleasure to see you today.  Continue current medications and continue follow-up with Pulmonology and Endocrinology.  Continue to monitor your blood pressure at home.  May return in 1 year or as needed.  Have annual mammogram.

## 2021-02-28 ENCOUNTER — Telehealth: Payer: Self-pay | Admitting: Pulmonary Disease

## 2021-03-01 MED ORDER — FOLIC ACID 1 MG PO TABS: 1.0000 mg | ORAL_TABLET | Freq: Every day | ORAL | 1 refills | Status: DC

## 2021-03-01 NOTE — Telephone Encounter (Signed)
Spoke with the pt  I notified her that her refill was sent  Nothing further needed

## 2021-03-21 ENCOUNTER — Other Ambulatory Visit: Payer: Self-pay

## 2021-03-21 MED ORDER — PANTOPRAZOLE SODIUM 40 MG PO TBEC: 40.0000 mg | DELAYED_RELEASE_TABLET | Freq: Every day | ORAL | 3 refills | Status: DC

## 2021-04-20 ENCOUNTER — Other Ambulatory Visit: Payer: Self-pay

## 2021-04-20 MED ORDER — ROSUVASTATIN CALCIUM 10 MG PO TABS: 10.0000 mg | ORAL_TABLET | Freq: Every day | ORAL | 0 refills | Status: DC

## 2021-05-03 ENCOUNTER — Ambulatory Visit
Admission: RE | Admit: 2021-05-03 | Discharge: 2021-05-03 | Disposition: A | Discharge status: Home / Self Care | Payer: BC Managed Care – PPO | Source: Ambulatory Visit | Attending: Endocrinology | Admitting: Endocrinology

## 2021-05-03 DIAGNOSIS — M8589 Other specified disorders of bone density and structure, multiple sites: Secondary | ICD-10-CM | POA: Diagnosis not present

## 2021-05-03 DIAGNOSIS — Z780 Asymptomatic menopausal state: Secondary | ICD-10-CM | POA: Diagnosis not present

## 2021-05-03 DIAGNOSIS — M810 Age-related osteoporosis without current pathological fracture: Secondary | ICD-10-CM

## 2021-05-10 ENCOUNTER — Ambulatory Visit (INDEPENDENT_AMBULATORY_CARE_PROVIDER_SITE_OTHER): Payer: BC Managed Care – PPO | Admitting: Endocrinology

## 2021-05-10 ENCOUNTER — Other Ambulatory Visit: Payer: Self-pay

## 2021-05-10 VITALS — BP 130/78 | HR 78 | Ht 64.0 in | Wt 131.6 lb

## 2021-05-10 DIAGNOSIS — M810 Age-related osteoporosis without current pathological fracture: Secondary | ICD-10-CM

## 2021-05-10 LAB — BASIC METABOLIC PANEL
BUN: 12 mg/dL (ref 6–23)
CO2: 31 mEq/L (ref 19–32)
Calcium: 9.5 mg/dL (ref 8.4–10.5)
Chloride: 101 mEq/L (ref 96–112)
Creatinine, Ser: 0.89 mg/dL (ref 0.40–1.20)
GFR: 68.2 mL/min (ref >60.00)
Glucose, Bld: 85 mg/dL (ref 70–99)
Potassium: 3.6 mEq/L (ref 3.5–5.1)
Sodium: 141 mEq/L (ref 135–145)

## 2021-05-10 LAB — VITAMIN D 25 HYDROXY (VIT D DEFICIENCY, FRACTURES): VITD: 45.59 ng/mL (ref 30.00–100.00)

## 2021-05-10 NOTE — Progress Notes (Signed)
Subjective:    Patient ID: Tamara Myers, female    DOB: Mar 03, 1956, 66 y.o.   MRN: [id number]  HPI Pt returns for f/u of osteoporosis: Dx'ed: 2021 Secondary cause: prednisone 2020-2021, for sarcoidosis, and Vit-D def.  Fractures:  T-spine in 2021 several fingers as a child right thumb (2015)--all with injuries.   Past rx: none Current rx: Reclast since 2022 Last DEXA result (2021): -2.7 (spine) Other: She takes Vit-D, 4000 units/d, (was reduced from 50,000/week, in 2022).  Interval hx: no change in chronic low back pain.  She takes vit-D as rx'ed.  No recent steroids.   Past Medical History:  Diagnosis Date   Cancer ([place])    basal cell carcinoma   Complication of anesthesia    slow to wake up   Dyspnea    on exertion   GERD (gastroesophageal reflux disease)    Hypertension    Melanoma ([place]) 2018   left upper arm   Moderate persistent asthma without complication 42/10/621   PONV (postoperative nausea and vomiting)    Post menopausal syndrome     Past Surgical History:  Procedure Laterality Date   BREAST CYST ASPIRATION Right    CESAREAN SECTION     OPEN REDUCTION INTERNAL FIXATION (ORIF) METACARPAL Right 11/24/2014   Procedure: OPEN REDUCTION INTERNAL FIXATION (ORIF) RIGHT THUMB;  Surgeon: Iran Planas, MD;  Location: [place];  Service: Orthopedics;  Laterality: Right;   skin shave biopsy  02/08/2021   left mid lateral posterior arm   thumb surgery Right 2014   VIDEO BRONCHOSCOPY N/A 11/20/2018   Procedure: Video Bronchoscopy With Fluoro;  Surgeon: Garner Nash, DO;  Location: [place];  Service: Thoracic;  Laterality: N/A;   VIDEO BRONCHOSCOPY WITH ENDOBRONCHIAL ULTRASOUND N/A 11/20/2018   Procedure: VIDEO BRONCHOSCOPY WITH ENDOBRONCHIAL ULTRASOUND;  Surgeon: Garner Nash, DO;  Location: [place];  Service: Thoracic;  Laterality: N/A;    Social History   Socioeconomic History   Marital status: Married    Spouse name: Not on file   Number of children: Not on  file   Years of education: Not on file   Highest education level: Not on file  Occupational History   Not on file  Tobacco Use   Smoking status: Never   Smokeless tobacco: Never  Vaping Use   Vaping Use: Never used  Substance and Sexual Activity   Alcohol use: No   Drug use: No   Sexual activity: Not on file  Other Topics Concern   Not on file  Social History Narrative   Not on file   Social Determinants of Health   Financial Resource Strain: Not on file  Food Insecurity: Not on file  Transportation Needs: Not on file  Physical Activity: Not on file  Stress: Not on file  Social Connections: Not on file  Intimate Partner Violence: Not on file    Current Outpatient Medications on File Prior to Visit  Medication Sig Dispense Refill   albuterol (PROVENTIL HFA;VENTOLIN HFA) 108 (90 Base) MCG/ACT inhaler Inhale 2 puffs into the lungs every 6 (six) hours as needed for wheezing or shortness of breath. 18 g 1   amLODipine (NORVASC) 5 MG tablet TAKE 1 TABLET BY MOUTH EVERY DAY 90 tablet 3   cholecalciferol (VITAMIN D3) 10 MCG (400 UNIT) TABS tablet Take 400 Units by mouth 2 (two) times daily.     fluticasone-salmeterol (ADVAIR HFA) 115-21 MCG/ACT inhaler Inhale 2 puffs into the lungs 2 (two) times daily. 12 g  12   folic acid (FOLVITE) 1 MG tablet Take 1 tablet (1 mg total) by mouth daily. 90 tablet 1   methotrexate (RHEUMATREX) 2.5 MG tablet Take 3 tablets by mouth once weekly Caution:Chemotherapy. Protect from light. 36 tablet 1   pantoprazole (PROTONIX) 40 MG tablet Take 1 tablet (40 mg total) by mouth daily. 90 tablet 3   rosuvastatin (CRESTOR) 10 MG tablet Take 1 tablet (10 mg total) by mouth daily at 6 PM. SCHEDULE OFFICE VISIT FOR FUTURE REFILLS. 30 tablet 0   triamterene-hydrochlorothiazide (MAXZIDE-25) 37.5-25 MG tablet TAKE 1 TABLET BY MOUTH EVERY DAY 90 tablet 2   No current facility-administered medications on file prior to visit.    No Known Allergies  Family History   Problem Relation Age of Onset   Osteoporosis Mother    Diabetes Father    Heart disease Father    Hyperlipidemia Father    Hypertension Father    Pulmonary fibrosis Father    Asthma Daughter    Breast cancer Neg Hx    Allergic rhinitis Neg Hx    Eczema Neg Hx    Urticaria Neg Hx    Angioedema Neg Hx     BP 130/78    Pulse 78    Ht 5\' 4"  (1.626 m)    Wt 131 lb 9.6 oz (59.7 kg)    SpO2 97%    BMI 22.59 kg/m    Review of Systems Denies falls.    Objective:   Physical Exam GAIT: normal and steady.    25-OH-Vit-D=46     Assessment & Plan:  Vit-D def: well-controlled.  Please continue the same Vit-D.   Osteoporosis, due for recheck soon  Patient Instructions  Blood tests are requested for you today.  We'll let you know about the results.   Please continue the same Reclast (next infusion is due after 07/19/21).   Please come back for a follow-up appointment in 6 months.

## 2021-05-10 NOTE — Patient Instructions (Addendum)
Blood tests are requested for you today.  We'll let you know about the results.   Please continue the same Reclast (next infusion is due after 07/19/21).   Please come back for a follow-up appointment in 6 months.

## 2021-05-11 ENCOUNTER — Other Ambulatory Visit: Payer: BC Managed Care – PPO

## 2021-05-23 ENCOUNTER — Other Ambulatory Visit: Payer: Self-pay

## 2021-05-23 ENCOUNTER — Other Ambulatory Visit: Payer: Self-pay | Admitting: Cardiology

## 2021-05-23 MED ORDER — AMLODIPINE BESYLATE 5 MG PO TABS: 5.0000 mg | ORAL_TABLET | Freq: Every day | ORAL | 1 refills | Status: DC

## 2021-05-23 MED ORDER — ROSUVASTATIN CALCIUM 10 MG PO TABS: 10.0000 mg | ORAL_TABLET | Freq: Every day | ORAL | 1 refills | Status: DC

## 2021-06-06 DIAGNOSIS — Z961 Presence of intraocular lens: Secondary | ICD-10-CM | POA: Diagnosis not present

## 2021-06-06 DIAGNOSIS — H524 Presbyopia: Secondary | ICD-10-CM | POA: Diagnosis not present

## 2021-06-06 DIAGNOSIS — H04123 Dry eye syndrome of bilateral lacrimal glands: Secondary | ICD-10-CM | POA: Diagnosis not present

## 2021-06-06 DIAGNOSIS — D869 Sarcoidosis, unspecified: Secondary | ICD-10-CM | POA: Diagnosis not present

## 2021-07-01 ENCOUNTER — Encounter: Payer: Self-pay | Admitting: Internal Medicine

## 2021-07-01 ENCOUNTER — Telehealth: Payer: Self-pay | Admitting: Pulmonary Disease

## 2021-07-01 NOTE — Telephone Encounter (Signed)
Called and spoke with pt. Pt said that she has been on 5mg  methotrexate for about 2 weeks now and since the time that she reduced her dose, pt started having problems with a cough and states she has been coughing a lot. Pt said that she has also been waking up in the middle of the night coughing. ? ? ? ?Pt wants to know if she should go back up to 7.5mg  on the methotrexate or if she should keep the dose as is or what could be recommended. ? ? ?Pt was made aware that Dr. Valeta Harms was not in the office until Monday, 3/6 and said she would be okay with waiting until Monday to hear from Dr. Valeta Harms directly. Routing to Dr. Valeta Harms for recommendations. ? ?Dr. Valeta Harms, please advise. ?

## 2021-07-04 NOTE — Telephone Encounter (Signed)
Called and spoke with patient.  Dr. Juline Patch recommendations given.  Understanding stated.  Patient did decline prednisone taper at this time.  Patient stated she didn't like taking prednisone, but would call back if needed for prednisone taper. ?

## 2021-07-12 ENCOUNTER — Encounter: Payer: Self-pay | Admitting: Gastroenterology

## 2021-07-12 ENCOUNTER — Telehealth: Payer: Self-pay | Admitting: Internal Medicine

## 2021-07-12 ENCOUNTER — Telehealth: Payer: Self-pay | Admitting: Gastroenterology

## 2021-07-12 NOTE — Telephone Encounter (Signed)
Tamara Myers ?[id number] ? ?Tamara Myers called she had just received cologaurd in the mail, she ask if she should do that again or scheduled a colonoscopy since she has never had one. ?

## 2021-07-12 NOTE — Telephone Encounter (Signed)
Referral placed, patient called ?

## 2021-07-12 NOTE — Telephone Encounter (Signed)
error 

## 2021-07-19 ENCOUNTER — Other Ambulatory Visit: Payer: Self-pay

## 2021-07-22 ENCOUNTER — Ambulatory Visit (AMBULATORY_SURGERY_CENTER): Payer: Medicare Other

## 2021-07-22 ENCOUNTER — Other Ambulatory Visit: Payer: Self-pay

## 2021-07-22 ENCOUNTER — Encounter: Payer: Self-pay | Admitting: Gastroenterology

## 2021-07-22 VITALS — Ht 64.0 in | Wt 130.0 lb

## 2021-07-22 DIAGNOSIS — Z1211 Encounter for screening for malignant neoplasm of colon: Secondary | ICD-10-CM

## 2021-07-22 NOTE — Progress Notes (Signed)
No egg or soy allergy known to patient  ?No issues known to pt with past sedation with any surgeries or procedures ?Patient denies ever being told they had issues or difficulty with intubation  ?No FH of Malignant Hyperthermia ?Pt is not on diet pills ?Pt is not on home 02  ?Pt is not on blood thinners  ?Pt denies issues with constipation  ?No A fib or A flutter ?Pt is fully vaccinated for Covid x 2 + boosters; ?NO PA's for preps discussed with pt in PV today  ?Discussed with pt there will be an out-of-pocket cost for prep and that varies from $0 to 70 + dollars - pt verbalized understanding  ?Due to the COVID-19 pandemic we are asking patients to follow certain guidelines in PV and the [place]   ?Pt aware of COVID protocols and [facility] guidelines  ?PV completed over the phone. Pt verified name, DOB, address and insurance during PV today.  ?Pt mailed instruction packet with copy of consent form to read and not return, and instructions.  ?Pt encouraged to call with questions or issues.  ?If pt has My chart, procedure instructions sent via My Chart  ? ?

## 2021-07-25 ENCOUNTER — Other Ambulatory Visit (INDEPENDENT_AMBULATORY_CARE_PROVIDER_SITE_OTHER): Payer: Medicare Other

## 2021-07-25 DIAGNOSIS — Z79631 Long term (current) use of antimetabolite agent: Secondary | ICD-10-CM | POA: Diagnosis not present

## 2021-07-25 DIAGNOSIS — Z5181 Encounter for therapeutic drug level monitoring: Secondary | ICD-10-CM

## 2021-07-25 LAB — CBC WITH DIFFERENTIAL/PLATELET
Basophils Absolute: 0 10*3/uL (ref 0.0–0.1)
Basophils Relative: 1.2 % (ref 0.0–3.0)
Eosinophils Absolute: 0.1 10*3/uL (ref 0.0–0.7)
Eosinophils Relative: 5.5 % — ABNORMAL HIGH (ref 0.0–5.0)
HCT: 39.6 % (ref 36.0–46.0)
Hemoglobin: 13.4 g/dL (ref 12.0–15.0)
Lymphocytes Relative: 32.1 % (ref 12.0–46.0)
Lymphs Abs: 0.6 10*3/uL — ABNORMAL LOW (ref 0.7–4.0)
MCHC: 33.9 g/dL (ref 30.0–36.0)
MCV: 90.2 fl (ref 78.0–100.0)
Monocytes Absolute: 0.4 10*3/uL (ref 0.1–1.0)
Monocytes Relative: 25.2 % — ABNORMAL HIGH (ref 3.0–12.0)
Neutro Abs: 0.6 10*3/uL — ABNORMAL LOW (ref 1.4–7.7)
Neutrophils Relative %: 36 % — ABNORMAL LOW (ref 43.0–77.0)
Platelets: 207 10*3/uL (ref 150.0–400.0)
RBC: 4.39 Mil/uL (ref 3.87–5.11)
RDW: 12.7 % (ref 11.5–15.5)
WBC: 1.8 10*3/uL — CL (ref 4.0–10.5)

## 2021-07-25 LAB — COMPREHENSIVE METABOLIC PANEL
ALT: 33 U/L (ref 0–35)
AST: 34 U/L (ref 0–37)
Albumin: 4.5 g/dL (ref 3.5–5.2)
Alkaline Phosphatase: 45 U/L (ref 39–117)
BUN: 14 mg/dL (ref 6–23)
CO2: 32 mEq/L (ref 19–32)
Calcium: 9.5 mg/dL (ref 8.4–10.5)
Chloride: 104 mEq/L (ref 96–112)
Creatinine, Ser: 0.96 mg/dL (ref 0.40–1.20)
GFR: 62.19 mL/min (ref >60.00)
Glucose, Bld: 85 mg/dL (ref 70–99)
Potassium: 3.5 mEq/L (ref 3.5–5.1)
Sodium: 142 mEq/L (ref 135–145)
Total Bilirubin: 0.8 mg/dL (ref 0.2–1.2)
Total Protein: 6.4 g/dL (ref 6.0–8.3)

## 2021-07-27 ENCOUNTER — Ambulatory Visit (INDEPENDENT_AMBULATORY_CARE_PROVIDER_SITE_OTHER): Payer: Medicare Other

## 2021-07-27 ENCOUNTER — Ambulatory Visit: Payer: Medicare Other | Admitting: Pulmonary Disease

## 2021-07-27 ENCOUNTER — Other Ambulatory Visit: Payer: Self-pay

## 2021-07-27 ENCOUNTER — Encounter: Payer: Self-pay | Admitting: Pulmonary Disease

## 2021-07-27 VITALS — BP 122/72 | HR 71 | Temp 97.9°F | Ht 64.0 in | Wt 131.2 lb

## 2021-07-27 VITALS — BP 118/67 | HR 70 | Temp 97.8°F | Resp 16 | Ht 64.0 in | Wt 131.2 lb

## 2021-07-27 DIAGNOSIS — J849 Interstitial pulmonary disease, unspecified: Secondary | ICD-10-CM

## 2021-07-27 DIAGNOSIS — D72819 Decreased white blood cell count, unspecified: Secondary | ICD-10-CM

## 2021-07-27 DIAGNOSIS — J4540 Moderate persistent asthma, uncomplicated: Secondary | ICD-10-CM

## 2021-07-27 DIAGNOSIS — Z5181 Encounter for therapeutic drug level monitoring: Secondary | ICD-10-CM

## 2021-07-27 DIAGNOSIS — Z79899 Other long term (current) drug therapy: Secondary | ICD-10-CM

## 2021-07-27 DIAGNOSIS — M810 Age-related osteoporosis without current pathological fracture: Secondary | ICD-10-CM | POA: Diagnosis not present

## 2021-07-27 DIAGNOSIS — D869 Sarcoidosis, unspecified: Secondary | ICD-10-CM | POA: Diagnosis not present

## 2021-07-27 DIAGNOSIS — Z79631 Long term (current) use of antimetabolite agent: Secondary | ICD-10-CM

## 2021-07-27 MED ORDER — SODIUM CHLORIDE 0.9 % IV SOLN: INTRAVENOUS | Status: DC

## 2021-07-27 MED ORDER — ACETAMINOPHEN 325 MG PO TABS
650.0000 mg | ORAL_TABLET | Freq: Once | ORAL | Status: AC
  Administered 2021-07-27: 650 mg via ORAL
  Filled 2021-07-27: qty 2

## 2021-07-27 MED ORDER — PREDNISONE 20 MG PO TABS: 20.0000 mg | ORAL_TABLET | Freq: Every day | ORAL | 0 refills | Status: AC

## 2021-07-27 MED ORDER — DIPHENHYDRAMINE HCL 25 MG PO CAPS
25.0000 mg | ORAL_CAPSULE | Freq: Once | ORAL | Status: AC
  Administered 2021-07-27: 25 mg via ORAL
  Filled 2021-07-27: qty 1

## 2021-07-27 MED ORDER — ZOLEDRONIC ACID 5 MG/100ML IV SOLN
5.0000 mg | Freq: Once | INTRAVENOUS | Status: AC
  Administered 2021-07-27: 5 mg via INTRAVENOUS
  Filled 2021-07-27: qty 100

## 2021-07-27 NOTE — Patient Instructions (Signed)
Thank you for visiting Dr. Valeta Harms at [facility]. ?Today we recommend the following: ? ?Orders Placed This Encounter  ?Procedures  ? CBC w/Diff  ? CBC w/Diff  ? ?Meds ordered this encounter  ?Medications  ? predniSONE (DELTASONE) 20 MG tablet  ?  Sig: Take 1 tablet (20 mg total) by mouth daily with breakfast.  ?  Dispense:  90 tablet  ?  Refill:  0  ? ?Stop MTX ? ?Return in about 4 weeks (around 08/24/2021) for Appt with Dr. Loanne Drilling First week of May. ? ? ? ?Please do your part to reduce the spread of COVID-19.  ? ?

## 2021-07-27 NOTE — Progress Notes (Signed)
? ?Synopsis: Referred in March 2020 for abnormal CT of the chest by Elby Showers, MD ? ?Subjective:  ? ?PATIENT ID: Tamara Myers GENDER: female DOB: [birth year], MRN: [id number] ? ?Chief Complaint  ?Patient presents with  ? Follow-up  ?  Follow up. Patient has no complaints.   ? ? ? ?Patient has had complaints of progressive cough for the past year.  She was recently placed on a PPI for treatment of reflux.  She was ultimately referred to allergy.  She saw Dr. Maudie Mercury which evaluated for her cough.  She had a chest x-ray which revealed interstitial markings.  She ultimately had a noncontrast CT of the chest which revealed perilymphatic micro-nodularity consistent with pulmonary sarcoidosis with small areas of bilateral hilar adenopathy and a right-sided 1.4 cm hilar lymph node.  She had a dominant right-sided right lower lobe nodule that measured 1 cm in size also likely secondary to sarcoid however recommending a 3 to 45-monthfollow-up.  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  Father died of pulmonary fibrosis.  Patient has not had any eye or skin symptoms. ? ?OV 03/12/2019:Patient underwent bronchoscopy November 20, 2018.  Bronchoscopy tissue biopsy revealed granulomatous inflammation.  Follow-up visit was via telephone.  In August 2020.  She was then placed on a 364-monthaper of prednisone with PCP prophylaxis of Bactrim.  Patient here today for follow-up after recent diagnosis of sarcoidosis.  Overall patient doing well today.  She recently completed her 3-34-monthper of prednisone.  Over the past 2 weeks she has noticed increase in her cough.  Today reviewed her CT scan which was completed getting of week which revealed improvement of the nodularity and parenchymal changes consistent with sarcoid however they are still present.  She still has dry  cough. ? ?OV 04/17/2019: Patient here today for follow-up after starting methotrexate and restarting her oral prednisone for sarcoidosis.  Doing well today.  Respiratory status is much improved as to previous.  She does not have significant cough anymore.  She feels a little bit better when it comes to her dyspnea on exertion but is still about the same.  Occasionally has cough but not much.  She has been faithful with taking her medication.  She has uptitrated methotrexate to 10 mg daily.  She is planned for labs today.  She overall denies hemoptysis weight loss, fevers chills night sweats. ? ?OV 06/25/2019: Respiratory symptoms stable at this time.  She does have some exertional dyspnea when she does yoga in the evenings.  Otherwise she is able to complete most activities of daily living.  Still trying to stay social distance.  She has noticed some puffiness in her cheeks that she feels may be related to fluid retention from her steroids.  She has noticed easy bruising as well.  Also right eye had a subconjunctival hemorrhage which was new that occurred spontaneously.  Patient denies hemoptysis fevers chills night sweats weight loss. ? ?OV 12/04/2019: Patient here today for follow-up of sarcoidosis. Otherwise doing well with no symptoms at this time. Taking 10 mg of prednisone today and 10 mg weekly of methotrexate. She has noticed some weight gain. She does feel like her face is a little bit more puffy. We did review her CT imaging that was completed and March 2021 which showed improvement. She has had 2 subsequent follow-ups  with Wyn Quaker, NP since last time I saw her in the office.  ? ?OV 03/17/2020: Here today for follow-up of sarcoidosis and immunosuppression.  Currently managed with prednisone and methotrexate.  After last office visit and plans were successfully tapering from prednisone.  Overall patient doing very well today from a respiratory standpoint.  She is slowly recovering from her thoracic  compression fractures.  She is off prednisone at this time maintenance dose of methotrexate.  Patient did have pulmonary function test that were completed prior to today's office visit which revealed postbronchodilator normal ratio, FVC of 79, FEV1 80%, TLC 87% predicted, DLCO 101%.  Stable in comparison.  Results reviewed today and interpreted in the office.  With patient. ? ?OV 07/28/2020: off prednisone around October. Not quite off for 6 months. Still maintained on MTX.  At this point her respiratory symptoms are stable.  She recently had blood work at the beginning of the year which also appears stable.  Serum creatinine reviewed as well as AST ALT within normal limits.  She is here today for follow-up after recent CT scan of the chest completed March 2022 which reveals resolution of her parenchymal abnormalities of sarcoidosis.  She has calcified mediastinal nodes. ? ?OV 01/20/2021: Here today for follow-up regarding sarcoidosis.  Currently maintained on methotrexate.  From respiratory standpoint doing well.  She did have COVID.  Was treated outpatient.  Had recent lab work completed at primary care office.  Also followed by endocrinology.  Patient denies cough sputum production shortness of breath with exertion. ? ?OV 07/27/2021: Here today for follow-up for blood work while on methotrexate for management of sarcoidosis.  We previously tried to taper off but she had symptoms that recurred.  So we restarted her methotrexate at her same maintenance dose and her symptoms resolved.  Unfortunately repeat blood work recently shows leukopenia with a white blood cell count now of 1.8.  We talked about this today in detail.  I do think she probably needs to come off of this medication and switch to something new.  I discussed case with Dr. Loanne Drilling one of my partners. ? ? ? ?Past Medical History:  ?Diagnosis Date  ? Cancer [facility])   ? basal cell carcinoma  ? Cataract   ? bilateral  ? Complication of anesthesia   ? slow to  wake up  ? Dyspnea   ? on exertion  ? GERD (gastroesophageal reflux disease)   ? on meds  ? Hyperlipidemia   ? on meds  ? Hypertension   ? on meds  ? Melanoma ([place]) 2018  ? left upper arm  ? Moderate persistent asthma without complication 92/03/9416  ? on meds  ? Osteoporosis   ? confirmed by bone scan  ? PONV (postoperative nausea and vomiting)   ? Post menopausal syndrome   ? Pulmonary sarcoidosis ([place])   ? tx'd  ?  ? ?Family History  ?Problem Relation Age of Onset  ? Osteoporosis Mother   ? Diabetes Father   ? Heart disease Father   ? Hyperlipidemia Father   ? Hypertension Father   ? Pulmonary fibrosis Father   ? Asthma Daughter   ? Breast cancer Neg Hx   ? Allergic rhinitis Neg Hx   ? Eczema Neg Hx   ? Urticaria Neg Hx   ? Angioedema Neg Hx   ? Colon polyps Neg Hx   ? Colon cancer Neg Hx   ? Esophageal cancer Neg Hx   ? Rectal cancer Neg  Hx   ? Stomach cancer Neg Hx   ?  ? ?Past Surgical History:  ?Procedure Laterality Date  ? BREAST CYST ASPIRATION Right   ? CATARACT EXTRACTION, BILATERAL Bilateral 10/2020  ? CESAREAN SECTION    ? OPEN REDUCTION INTERNAL FIXATION (ORIF) METACARPAL Right 11/24/2014  ? Procedure: OPEN REDUCTION INTERNAL FIXATION (ORIF) RIGHT THUMB;  Surgeon: Iran Planas, MD;  Location: [place];  Service: Orthopedics;  Laterality: Right;  ? skin shave biopsy  02/08/2021  ? left mid lateral posterior arm  ? thumb surgery Right 2014  ? VIDEO BRONCHOSCOPY N/A 11/20/2018  ? Procedure: Video Bronchoscopy With Fluoro;  Surgeon: Garner Nash, DO;  Location: [facility] OR;  Service: Thoracic;  Laterality: N/A;  ? VIDEO BRONCHOSCOPY WITH ENDOBRONCHIAL ULTRASOUND N/A 11/20/2018  ? Procedure: VIDEO BRONCHOSCOPY WITH ENDOBRONCHIAL ULTRASOUND;  Surgeon: Garner Nash, DO;  Location: [place];  Service: Thoracic;  Laterality: N/A;  ? ? ?Social History  ? ?Socioeconomic History  ? Marital status: Married  ?  Spouse name: Not on file  ? Number of children: Not on file  ? Years of education: Not on file  ? Highest  education level: Not on file  ?Occupational History  ? Not on file  ?Tobacco Use  ? Smoking status: Never  ? Smokeless tobacco: Never  ?Vaping Use  ? Vaping Use: Never used  ?Substance and Sexual Activity  ? Alcohol use: No

## 2021-07-27 NOTE — Progress Notes (Signed)
Diagnosis: Osteoporosis ? ?Provider:  Marshell Garfinkel, MD ? ?Procedure: Infusion ? ?IV Type: Peripheral, IV Location: L Antecubital ? ?Reclast (Zolendronic Acid), Dose: 5 mg ? ?Infusion Start Time: 4301 ? ?Infusion Stop Time: 4840 ? ?Post Infusion IV Care: Observation period completed and Peripheral IV Discontinued ? ?Discharge: Condition: Good, Destination: Home . AVS provided to patient.  ? ?Performed by:  Cleophus Molt, RN  ?  ?

## 2021-08-02 ENCOUNTER — Ambulatory Visit: Payer: Medicare Other | Admitting: Endocrinology

## 2021-08-02 ENCOUNTER — Encounter: Payer: Self-pay | Admitting: Endocrinology

## 2021-08-02 VITALS — BP 114/78 | HR 81 | Ht 64.0 in | Wt 130.4 lb

## 2021-08-02 DIAGNOSIS — M810 Age-related osteoporosis without current pathological fracture: Secondary | ICD-10-CM

## 2021-08-02 NOTE — Patient Instructions (Addendum)
Please continue the same Reclast (next infusion is due after 07/19/21).   ?Please come back for a follow-up appointment in 6 months.  ?

## 2021-08-02 NOTE — Progress Notes (Signed)
? ?Subjective:  ? ? Patient ID: Tamara Myers, female    DOB: [birth year], 66 y.o.   MRN: [id number] ? ?HPI ?Pt returns for f/u of osteoporosis: ?Dx'ed: 2021 ?Secondary cause: prednisone 2020-2021, for sarcoidosis, and Vit-D def.  ?Fractures:  T-spine in 2021 (nontraumatic), and several fingers as a child right thumb (2015--with injuries).   ?Past rx: none ?Current rx: Reclast since 2022 ?Last DEXA result (2023): -2.3 ([facility]) ?Other: She takes Vit-D, 4000 units/d, (was reduced from 50,000/week, in 2022).   ?Interval hx: no change in chronic low back pain.  She takes Vit-D as rx'ed.  She is back on steroids for sarcoidosis.   ?Past Medical History:  ?Diagnosis Date  ? Cancer [facility])   ? basal cell carcinoma  ? Cataract   ? bilateral  ? Complication of anesthesia   ? slow to wake up  ? Dyspnea   ? on exertion  ? GERD (gastroesophageal reflux disease)   ? on meds  ? Hyperlipidemia   ? on meds  ? Hypertension   ? on meds  ? Melanoma ([place]) 2018  ? left upper arm  ? Moderate persistent asthma without complication 58/52/7782  ? on meds  ? Osteoporosis   ? confirmed by bone scan  ? PONV (postoperative nausea and vomiting)   ? Post menopausal syndrome   ? Pulmonary sarcoidosis ([place])   ? tx'd  ? ? ?Past Surgical History:  ?Procedure Laterality Date  ? BREAST CYST ASPIRATION Right   ? CATARACT EXTRACTION, BILATERAL Bilateral 10/2020  ? CESAREAN SECTION    ? OPEN REDUCTION INTERNAL FIXATION (ORIF) METACARPAL Right 11/24/2014  ? Procedure: OPEN REDUCTION INTERNAL FIXATION (ORIF) RIGHT THUMB;  Surgeon: Iran Planas, MD;  Location: [place];  Service: Orthopedics;  Laterality: Right;  ? skin shave biopsy  02/08/2021  ? left mid lateral posterior arm  ? thumb surgery Right 2014  ? VIDEO BRONCHOSCOPY N/A 11/20/2018  ? Procedure: Video Bronchoscopy With Fluoro;  Surgeon: Garner Nash, DO;  Location: [facility];  Service: Thoracic;  Laterality: N/A;  ? VIDEO BRONCHOSCOPY WITH ENDOBRONCHIAL ULTRASOUND N/A 11/20/2018  ? Procedure: VIDEO  BRONCHOSCOPY WITH ENDOBRONCHIAL ULTRASOUND;  Surgeon: Garner Nash, DO;  Location: [place];  Service: Thoracic;  Laterality: N/A;  ? ? ?Social History  ? ?Socioeconomic History  ? Marital status: Married  ?  Spouse name: Not on file  ? Number of children: Not on file  ? Years of education: Not on file  ? Highest education level: Not on file  ?Occupational History  ? Not on file  ?Tobacco Use  ? Smoking status: Never  ? Smokeless tobacco: Never  ?Vaping Use  ? Vaping Use: Never used  ?Substance and Sexual Activity  ? Alcohol use: No  ? Drug use: No  ? Sexual activity: Not on file  ?Other Topics Concern  ? Not on file  ?Social History Narrative  ? Not on file  ? ?Social Determinants of Health  ? ?Financial Resource Strain: Not on file  ?Food Insecurity: Not on file  ?Transportation Needs: Not on file  ?Physical Activity: Not on file  ?Stress: Not on file  ?Social Connections: Not on file  ?Intimate Partner Violence: Not on file  ? ? ?Current Outpatient Medications on File Prior to Visit  ?Medication Sig Dispense Refill  ? albuterol (PROVENTIL HFA;VENTOLIN HFA) 108 (90 Base) MCG/ACT inhaler Inhale 2 puffs into the lungs every 6 (six) hours as needed for wheezing or shortness of breath. 18 g 1  ?  amLODipine (NORVASC) 5 MG tablet Take 1 tablet (5 mg total) by mouth daily. 90 tablet 1  ? cholecalciferol (VITAMIN D3) 10 MCG (400 UNIT) TABS tablet Take 400 Units by mouth 2 (two) times daily.    ? fluticasone-salmeterol (ADVAIR HFA) 115-21 MCG/ACT inhaler Inhale 2 puffs into the lungs 2 (two) times daily. 12 g 12  ? folic acid (FOLVITE) 1 MG tablet Take 1 tablet (1 mg total) by mouth daily. 90 tablet 1  ? methotrexate (RHEUMATREX) 2.5 MG tablet Take 3 tablets by mouth once weekly Caution:Chemotherapy. Protect from light. (Patient taking differently: Take 5 mg by mouth once a week. Take 2 tablets by mouth once weekly Caution:Chemotherapy. Protect from light.) 36 tablet 1  ? pantoprazole (PROTONIX) 40 MG tablet Take 1 tablet  (40 mg total) by mouth daily. 90 tablet 3  ? predniSONE (DELTASONE) 20 MG tablet Take 1 tablet (20 mg total) by mouth daily with breakfast. 90 tablet 0  ? rosuvastatin (CRESTOR) 10 MG tablet Take 1 tablet (10 mg total) by mouth daily at 6 PM. 90 tablet 1  ? triamterene-hydrochlorothiazide (MAXZIDE-25) 37.5-25 MG tablet TAKE 1 TABLET BY MOUTH EVERY DAY 90 tablet 2  ? Zoledronic Acid (RECLAST IV) Inject into the vein.    ? ?No current facility-administered medications on file prior to visit.  ? ? ?No Known Allergies ? ?Family History  ?Problem Relation Age of Onset  ? Osteoporosis Mother   ? Diabetes Father   ? Heart disease Father   ? Hyperlipidemia Father   ? Hypertension Father   ? Pulmonary fibrosis Father   ? Asthma Daughter   ? Breast cancer Neg Hx   ? Allergic rhinitis Neg Hx   ? Eczema Neg Hx   ? Urticaria Neg Hx   ? Angioedema Neg Hx   ? Colon polyps Neg Hx   ? Colon cancer Neg Hx   ? Esophageal cancer Neg Hx   ? Rectal cancer Neg Hx   ? Stomach cancer Neg Hx   ? ? ?BP 114/78 (BP Location: Left Arm, Patient Position: Sitting, Cuff Size: Normal)   Pulse 81   Ht '5\' 4"'$  (1.626 m)   Wt 130 lb 6.4 oz (59.1 kg)   SpO2 97%   BMI 22.38 kg/m?  ? ? ?Review of Systems ?Denies falls ?   ?Objective:  ? Physical Exam ?VITAL SIGNS:  See vs page.  ?GENERAL: no distress.  ?GAIT: normal and steady.  ? ? ?   ?Assessment & Plan:  ?Osteoporosis: uncontrolled.   ? ?Patient Instructions  ?Please continue the same Reclast (next infusion is due after 07/19/21).   ?Please come back for a follow-up appointment in 6 months.  ? ? ? ?

## 2021-08-09 ENCOUNTER — Encounter: Payer: Self-pay | Admitting: Endocrinology

## 2021-08-10 ENCOUNTER — Other Ambulatory Visit: Payer: Medicare Other

## 2021-08-10 DIAGNOSIS — D869 Sarcoidosis, unspecified: Secondary | ICD-10-CM

## 2021-08-10 LAB — CBC WITH DIFFERENTIAL/PLATELET
Basophils Absolute: 0 10*3/uL (ref 0.0–0.1)
Basophils Relative: 0.2 % (ref 0.0–3.0)
Eosinophils Absolute: 0 10*3/uL (ref 0.0–0.7)
Eosinophils Relative: 0.2 % (ref 0.0–5.0)
HCT: 40.7 % (ref 36.0–46.0)
Hemoglobin: 13.8 g/dL (ref 12.0–15.0)
Lymphocytes Relative: 5 % — ABNORMAL LOW (ref 12.0–46.0)
Lymphs Abs: 0.5 10*3/uL — ABNORMAL LOW (ref 0.7–4.0)
MCHC: 34 g/dL (ref 30.0–36.0)
MCV: 89.3 fl (ref 78.0–100.0)
Monocytes Absolute: 0.8 10*3/uL (ref 0.1–1.0)
Monocytes Relative: 7.2 % (ref 3.0–12.0)
Neutro Abs: 9.3 10*3/uL — ABNORMAL HIGH (ref 1.4–7.7)
Neutrophils Relative %: 87.4 % — ABNORMAL HIGH (ref 43.0–77.0)
Platelets: 266 10*3/uL (ref 150.0–400.0)
RBC: 4.55 Mil/uL (ref 3.87–5.11)
RDW: 12.8 % (ref 11.5–15.5)
WBC: 10.6 10*3/uL — ABNORMAL HIGH (ref 4.0–10.5)

## 2021-08-11 ENCOUNTER — Encounter: Payer: Self-pay | Admitting: Certified Registered Nurse Anesthetist

## 2021-08-15 ENCOUNTER — Encounter: Payer: Self-pay | Admitting: Endocrinology

## 2021-08-15 DIAGNOSIS — D485 Neoplasm of uncertain behavior of skin: Secondary | ICD-10-CM | POA: Diagnosis not present

## 2021-08-15 DIAGNOSIS — C44629 Squamous cell carcinoma of skin of left upper limb, including shoulder: Secondary | ICD-10-CM | POA: Diagnosis not present

## 2021-08-15 DIAGNOSIS — D2262 Melanocytic nevi of left upper limb, including shoulder: Secondary | ICD-10-CM | POA: Diagnosis not present

## 2021-08-15 DIAGNOSIS — L570 Actinic keratosis: Secondary | ICD-10-CM | POA: Diagnosis not present

## 2021-08-15 DIAGNOSIS — Z85828 Personal history of other malignant neoplasm of skin: Secondary | ICD-10-CM | POA: Diagnosis not present

## 2021-08-15 NOTE — Progress Notes (Signed)
Ty please let patient know that her WBC has recovered. Please keep appt with Dr. Loanne Drilling to discussion future treatment options.  ? ?Thanks, ? ?BLI ? ?Garner Nash, DO ?[facility] ?08/15/2021 9:24 AM  ?

## 2021-08-16 ENCOUNTER — Other Ambulatory Visit: Payer: Self-pay | Admitting: Internal Medicine

## 2021-08-16 ENCOUNTER — Encounter: Payer: Self-pay | Admitting: Certified Registered Nurse Anesthetist

## 2021-08-17 ENCOUNTER — Ambulatory Visit (AMBULATORY_SURGERY_CENTER): Payer: Medicare Other | Admitting: Gastroenterology

## 2021-08-17 ENCOUNTER — Encounter: Payer: Self-pay | Admitting: Gastroenterology

## 2021-08-17 VITALS — BP 111/65 | HR 68 | Temp 99.1°F | Resp 16 | Ht 64.0 in | Wt 130.0 lb

## 2021-08-17 DIAGNOSIS — Z1211 Encounter for screening for malignant neoplasm of colon: Secondary | ICD-10-CM

## 2021-08-17 DIAGNOSIS — D128 Benign neoplasm of rectum: Secondary | ICD-10-CM

## 2021-08-17 DIAGNOSIS — K621 Rectal polyp: Secondary | ICD-10-CM | POA: Diagnosis not present

## 2021-08-17 DIAGNOSIS — K6389 Other specified diseases of intestine: Secondary | ICD-10-CM | POA: Diagnosis not present

## 2021-08-17 DIAGNOSIS — I10 Essential (primary) hypertension: Secondary | ICD-10-CM | POA: Diagnosis not present

## 2021-08-17 MED ORDER — SODIUM CHLORIDE 0.9 % IV SOLN: 500.0000 mL | Freq: Once | INTRAVENOUS | Status: DC

## 2021-08-17 NOTE — Progress Notes (Signed)
Pt's states no medical or surgical changes since previsit or office visit. 

## 2021-08-17 NOTE — Patient Instructions (Signed)
Handout on polyps, diverticulitis and hemorrhoids provided  ? ?Await pathology results.  ? ?Continue current medications.  ? ?YOU HAD AN ENDOSCOPIC PROCEDURE TODAY AT [facility]:   Refer to the procedure report that was given to you for any specific questions about what was found during the examination.  If the procedure report does not answer your questions, please call your gastroenterologist to clarify.  If you requested that your care partner not be given the details of your procedure findings, then the procedure report has been included in a sealed envelope for you to review at your convenience later. ? ?YOU SHOULD EXPECT: Some feelings of bloating in the abdomen. Passage of more gas than usual.  Walking can help get rid of the air that was put into your GI tract during the procedure and reduce the bloating. If you had a lower endoscopy (such as a colonoscopy or flexible sigmoidoscopy) you may notice spotting of blood in your stool or on the toilet paper. If you underwent a bowel prep for your procedure, you may not have a normal bowel movement for a few days. ? ?Please Note:  You might notice some irritation and congestion in your nose or some drainage.  This is from the oxygen used during your procedure.  There is no need for concern and it should clear up in a day or so. ? ?SYMPTOMS TO REPORT IMMEDIATELY: ? ?Following lower endoscopy (colonoscopy or flexible sigmoidoscopy): ? Excessive amounts of blood in the stool ? Significant tenderness or worsening of abdominal pains ? Swelling of the abdomen that is new, acute ? Fever of 100?F or higher ? ? ?For urgent or emergent issues, a gastroenterologist can be reached at any hour by calling [phone]. ?Do not use MyChart messaging for urgent concerns.  ? ? ?DIET:  We do recommend a small meal at first, but then you may proceed to your regular diet.  Drink plenty of fluids but you should avoid alcoholic beverages for 24 hours. ? ?ACTIVITY:   You should plan to take it easy for the rest of today and you should NOT DRIVE or use heavy machinery until tomorrow (because of the sedation medicines used during the test).   ? ?FOLLOW UP: ?Our staff will call the number listed on your records 48-72 hours following your procedure to check on you and address any questions or concerns that you may have regarding the information given to you following your procedure. If we do not reach you, we will leave a message.  We will attempt to reach you two times.  During this call, we will ask if you have developed any symptoms of COVID 19. If you develop any symptoms (ie: fever, flu-like symptoms, shortness of breath, cough etc.) before then, please call [phone].  If you test positive for Covid 19 in the 2 weeks post procedure, please call and report this information to [place].   ? ?If any biopsies were taken you will be contacted by phone or by letter within the next 1-3 weeks.  Please call us at [phone] if you have not heard about the biopsies in 3 weeks.  ? ? ?SIGNATURES/CONFIDENTIALITY: ?You and/or your care partner have signed paperwork which will be entered into your electronic medical record.  These signatures attest to the fact that that the information above on your After Visit Summary has been reviewed and is understood.  Full responsibility of the confidentiality of this discharge information lies with you and/or your care-partner. ? ? ?

## 2021-08-17 NOTE — Op Note (Signed)
[place] ?Patient Name: Tamara Myers ?Procedure Date: 08/17/2021 10:31 AM ?MRN: [id number] ?Endoscopist: Justice Britain , MD ?Age: 66 ?Referring MD:  ?Date of Birth: 06-09-1955 ?Gender: Female ?Account #: [id number] ?Procedure:                Colonoscopy ?Indications:              Screening for colorectal malignant neoplasm, This  ?                          is the patient's first colonoscopy but has done  ?                          Cologuard previously ?Medicines:                Monitored Anesthesia Care ?Procedure:                Pre-Anesthesia Assessment: ?                          - Prior to the procedure, a History and Physical  ?                          was performed, and patient medications and  ?                          allergies were reviewed. The patient's tolerance of  ?                          previous anesthesia was also reviewed. The risks  ?                          and benefits of the procedure and the sedation  ?                          options and risks were discussed with the patient.  ?                          All questions were answered, and informed consent  ?                          was obtained. Prior Anticoagulants: The patient has  ?                          taken no previous anticoagulant or antiplatelet  ?                          agents. ASA Grade Assessment: II - A patient with  ?                          mild systemic disease. After reviewing the risks  ?                          and benefits, the patient was deemed in  ?  satisfactory condition to undergo the procedure. ?                          After obtaining informed consent, the colonoscope  ?                          was passed under direct vision. Throughout the  ?                          procedure, the patient's blood pressure, pulse, and  ?                          oxygen saturations were monitored continuously. The  ?                          Olympus PCF-H190DL (#[id number])  Colonoscope was  ?                          introduced through the anus and advanced to the the  ?                          cecum, identified by appendiceal orifice and  ?                          ileocecal valve. The colonoscopy was somewhat  ?                          difficult due to a tortuous colon. Successful  ?                          completion of the procedure was aided by changing  ?                          the patient's position, using manual pressure,  ?                          straightening and shortening the scope to obtain  ?                          bowel loop reduction and using scope torsion. The  ?                          patient tolerated the procedure. The quality of the  ?                          bowel preparation was adequate. The ileocecal  ?                          valve, appendiceal orifice, and rectum were  ?                          photographed. ?Scope In: 10:40:07 AM ?Scope Out: 10:59:14 AM ?Scope Withdrawal Time: 0 hours 14 minutes 7 seconds  ?Total Procedure Duration: 0 hours 19 minutes 7  seconds  ?Findings:                 The digital rectal exam findings include  ?                          hemorrhoids. Pertinent negatives include no  ?                          palpable rectal lesions. ?                          The left colon was significantly tortuous. ?                          Two sessile polyps were found in the rectum. The  ?                          polyps were 2 to 4 mm in size. These polyps were  ?                          removed with a cold snare. Resection and retrieval  ?                          were complete. ?                          A localized area of granular mucosa was found in  ?                          the cecum base extending over the appendix orifice.  ?                          This looks to be more of just increased  ?                          vascularity, however, to be sure biopsies were  ?                          taken with a cold forceps for  histology to rule out  ?                          dysplasia. ?                          Normal mucosa was found in the entire colon  ?                          otherwise. ?                          Non-bleeding non-thrombosed external and internal  ?                          hemorrhoids were found during retroflexion, during  ?  perianal exam and during digital exam. The  ?                          hemorrhoids were Grade II (internal hemorrhoids  ?                          that prolapse but reduce spontaneously). ?Complications:            No immediate complications. ?Estimated Blood Loss:     Estimated blood loss was minimal. ?Impression:               - Hemorrhoids found on digital rectal exam. ?                          - Tortuous left colon. ?                          - Two 2 to 4 mm polyps in the rectum, removed with  ?                          a cold snare. Resected and retrieved. ?                          - Granularity in the cecum. Biopsied to rule out  ?                          dysplasia. ?                          - Normal mucosa in the entire examined colon  ?                          otherwise. ?                          - Non-bleeding non-thrombosed external and internal  ?                          hemorrhoids. ?Recommendation:           - The patient will be observed post-procedure,  ?                          until all discharge criteria are met. ?                          - Discharge patient to home. ?                          - Patient has a contact number available for  ?                          emergencies. The signs and symptoms of potential  ?                          delayed complications were discussed with the  ?  patient. Return to normal activities tomorrow.  ?                          Written discharge instructions were provided to the  ?                          patient. ?                          - High fiber diet. ?                           - Use FiberCon 1-2 tablets PO daily. ?                          - Continue present medications. ?                          - Await pathology results. ?                          - Repeat colonoscopy for surveillance based on  ?                          pathology results. If evidence of dysplasia is  ?                          found on cecal biopsies, then repeat colonoscopy  ?                          with attempt at EMR can be considered. Otherwise  ?                          will be dependent on the final pathology of the  ?                          rectal polyps. ?                          - The findings and recommendations were discussed  ?                          with the patient. ?                          - The findings and recommendations were discussed  ?                          with the patient's family. ?Justice Britain, MD ?08/17/2021 11:07:28 AM ?

## 2021-08-17 NOTE — Progress Notes (Signed)
? ?GASTROENTEROLOGY PROCEDURE H&P NOTE  ? ?Primary Care Physician: ?Elby Showers, MD ? ?HPI: ?Tamara Myers is a 66 y.o. female who presents for Colonoscopy for screening. ? ?Past Medical History:  ?Diagnosis Date  ? Cancer [facility])   ? basal cell carcinoma  ? Cataract   ? bilateral  ? Complication of anesthesia   ? slow to wake up  ? Dyspnea   ? on exertion  ? GERD (gastroesophageal reflux disease)   ? on meds  ? Hyperlipidemia   ? on meds  ? Hypertension   ? on meds  ? Melanoma ([place]) 2018  ? left upper arm  ? Moderate persistent asthma without complication 23/55/7322  ? on meds  ? Osteoporosis   ? confirmed by bone scan  ? PONV (postoperative nausea and vomiting)   ? Post menopausal syndrome   ? Pulmonary sarcoidosis ([place])   ? tx'd  ? ?Past Surgical History:  ?Procedure Laterality Date  ? BREAST CYST ASPIRATION Right   ? CATARACT EXTRACTION, BILATERAL Bilateral 10/2020  ? CESAREAN SECTION    ? OPEN REDUCTION INTERNAL FIXATION (ORIF) METACARPAL Right 11/24/2014  ? Procedure: OPEN REDUCTION INTERNAL FIXATION (ORIF) RIGHT THUMB;  Surgeon: Iran Planas, MD;  Location: [place];  Service: Orthopedics;  Laterality: Right;  ? skin shave biopsy  02/08/2021  ? left mid lateral posterior arm  ? thumb surgery Right 2014  ? VIDEO BRONCHOSCOPY N/A 11/20/2018  ? Procedure: Video Bronchoscopy With Fluoro;  Surgeon: Garner Nash, DO;  Location: [facility] OR;  Service: Thoracic;  Laterality: N/A;  ? VIDEO BRONCHOSCOPY WITH ENDOBRONCHIAL ULTRASOUND N/A 11/20/2018  ? Procedure: VIDEO BRONCHOSCOPY WITH ENDOBRONCHIAL ULTRASOUND;  Surgeon: Garner Nash, DO;  Location: [place];  Service: Thoracic;  Laterality: N/A;  ? ?Current Outpatient Medications  ?Medication Sig Dispense Refill  ? amLODipine (NORVASC) 5 MG tablet Take 1 tablet (5 mg total) by mouth daily. 90 tablet 1  ? fluticasone-salmeterol (ADVAIR HFA) 115-21 MCG/ACT inhaler Inhale 2 puffs into the lungs 2 (two) times daily. 12 g 12  ? folic acid (FOLVITE) 1 MG tablet Take 1 tablet  (1 mg total) by mouth daily. 90 tablet 1  ? pantoprazole (PROTONIX) 40 MG tablet Take 1 tablet (40 mg total) by mouth daily. 90 tablet 3  ? predniSONE (DELTASONE) 20 MG tablet Take 1 tablet (20 mg total) by mouth daily with breakfast. 90 tablet 0  ? rosuvastatin (CRESTOR) 10 MG tablet Take 1 tablet (10 mg total) by mouth daily at 6 PM. 90 tablet 1  ? triamterene-hydrochlorothiazide (MAXZIDE-25) 37.5-25 MG tablet TAKE ONE TABLET BY MOUTH DAILY 90 tablet 1  ? albuterol (PROVENTIL HFA;VENTOLIN HFA) 108 (90 Base) MCG/ACT inhaler Inhale 2 puffs into the lungs every 6 (six) hours as needed for wheezing or shortness of breath. 18 g 1  ? methotrexate (RHEUMATREX) 2.5 MG tablet Take 3 tablets by mouth once weekly Caution:Chemotherapy. Protect from light. (Patient taking differently: Take 5 mg by mouth once a week. Take 2 tablets by mouth once weekly Caution:Chemotherapy. Protect from light.) 36 tablet 1  ? nystatin (MYCOSTATIN) 100000 UNIT/ML suspension Take by mouth. (Patient not taking: Reported on 08/17/2021)    ? Zoledronic Acid (RECLAST IV) Inject into the vein.    ? ?Current Facility-Administered Medications  ?Medication Dose Route Frequency Provider Last Rate Last Admin  ? 0.9 %  sodium chloride infusion  500 mL Intravenous Once Mansouraty, Telford Nab., MD      ? ? ?Current Outpatient Medications:  ?  amLODipine (NORVASC) 5 MG tablet, Take 1 tablet (5 mg total) by mouth daily., Disp: 90 tablet, Rfl: 1 ?  fluticasone-salmeterol (ADVAIR HFA) 115-21 MCG/ACT inhaler, Inhale 2 puffs into the lungs 2 (two) times daily., Disp: 12 g, Rfl: 12 ?  folic acid (FOLVITE) 1 MG tablet, Take 1 tablet (1 mg total) by mouth daily., Disp: 90 tablet, Rfl: 1 ?  pantoprazole (PROTONIX) 40 MG tablet, Take 1 tablet (40 mg total) by mouth daily., Disp: 90 tablet, Rfl: 3 ?  predniSONE (DELTASONE) 20 MG tablet, Take 1 tablet (20 mg total) by mouth daily with breakfast., Disp: 90 tablet, Rfl: 0 ?  rosuvastatin (CRESTOR) 10 MG tablet, Take 1 tablet  (10 mg total) by mouth daily at 6 PM., Disp: 90 tablet, Rfl: 1 ?  triamterene-hydrochlorothiazide (MAXZIDE-25) 37.5-25 MG tablet, TAKE ONE TABLET BY MOUTH DAILY, Disp: 90 tablet, Rfl: 1 ?  albuterol (PROVENTIL HFA;VENTOLIN HFA) 108 (90 Base) MCG/ACT inhaler, Inhale 2 puffs into the lungs every 6 (six) hours as needed for wheezing or shortness of breath., Disp: 18 g, Rfl: 1 ?  methotrexate (RHEUMATREX) 2.5 MG tablet, Take 3 tablets by mouth once weekly Caution:Chemotherapy. Protect from light. (Patient taking differently: Take 5 mg by mouth once a week. Take 2 tablets by mouth once weekly Caution:Chemotherapy. Protect from light.), Disp: 36 tablet, Rfl: 1 ?  nystatin (MYCOSTATIN) 100000 UNIT/ML suspension, Take by mouth. (Patient not taking: Reported on 08/17/2021), Disp: , Rfl:  ?  Zoledronic Acid (RECLAST IV), Inject into the vein., Disp: , Rfl:  ? ?Current Facility-Administered Medications:  ?  0.9 %  sodium chloride infusion, 500 mL, Intravenous, Once, Mansouraty, Telford Nab., MD ?No Known Allergies ?Family History  ?Problem Relation Age of Onset  ? Osteoporosis Mother   ? Diabetes Father   ? Heart disease Father   ? Hyperlipidemia Father   ? Hypertension Father   ? Pulmonary fibrosis Father   ? Asthma Daughter   ? Breast cancer Neg Hx   ? Allergic rhinitis Neg Hx   ? Eczema Neg Hx   ? Urticaria Neg Hx   ? Angioedema Neg Hx   ? Colon polyps Neg Hx   ? Colon cancer Neg Hx   ? Esophageal cancer Neg Hx   ? Rectal cancer Neg Hx   ? Stomach cancer Neg Hx   ? ?Social History  ? ?Socioeconomic History  ? Marital status: Married  ?  Spouse name: Not on file  ? Number of children: Not on file  ? Years of education: Not on file  ? Highest education level: Not on file  ?Occupational History  ? Not on file  ?Tobacco Use  ? Smoking status: Never  ? Smokeless tobacco: Never  ?Vaping Use  ? Vaping Use: Never used  ?Substance and Sexual Activity  ? Alcohol use: No  ? Drug use: No  ? Sexual activity: Not on file  ?Other Topics  Concern  ? Not on file  ?Social History Narrative  ? Not on file  ? ?Social Determinants of Health  ? ?Financial Resource Strain: Not on file  ?Food Insecurity: Not on file  ?Transportation Needs: Not on file  ?Physical Activity: Not on file  ?Stress: Not on file  ?Social Connections: Not on file  ?Intimate Partner Violence: Not on file  ? ? ?Physical Exam: ?Today's Vitals  ? 08/17/21 0955  ?BP: 118/73  ?Pulse: 78  ?Temp: 99.1 ?F (37.3 ?C)  ?SpO2: 100%  ?Weight: 130 lb (59 kg)  ?Height:  $'5\' 4"'g$  (1.626 m)  ? ?Body mass index is 22.31 kg/m?. ?GEN: NAD ?EYE: Sclerae anicteric ?ENT: MMM ?CV: Non-tachycardic ?GI: Soft, NT/ND ?NEURO:  Alert & Oriented x 3 ? ?Lab Results: ?No results for input(s): WBC, HGB, HCT, PLT in the last 72 hours. ?BMET ?No results for input(s): NA, K, CL, CO2, GLUCOSE, BUN, CREATININE, CALCIUM in the last 72 hours. ?LFT ?No results for input(s): PROT, ALBUMIN, AST, ALT, ALKPHOS, BILITOT, BILIDIR, IBILI in the last 72 hours. ?PT/INR ?No results for input(s): LABPROT, INR in the last 72 hours. ? ? ?Impression / Plan: ?This is a 66 y.o.female who presents for Colonoscopy for screening. ? ?The risks and benefits of endoscopic evaluation/treatment were discussed with the patient and/or family; these include but are not limited to the risk of perforation, infection, bleeding, missed lesions, lack of diagnosis, severe illness requiring hospitalization, as well as anesthesia and sedation related illnesses.  The patient's history has been reviewed, patient examined, no change in status, and deemed stable for procedure.  The patient and/or family is agreeable to proceed.  ? ? ?Justice Britain, MD ?[facility] ?Advanced Endoscopy ?Office # [phone] ? ?

## 2021-08-17 NOTE — Progress Notes (Signed)
1045 Patient experiencing nausea and vomiting.  MD updated and Zofran 4 mg IV given, vss  ?

## 2021-08-17 NOTE — Progress Notes (Signed)
Report given to PACU, vss 

## 2021-08-17 NOTE — Progress Notes (Signed)
Called to room to assist during endoscopic procedure.  Patient ID and intended procedure confirmed with present staff. Received instructions for my participation in the procedure from the performing physician.  

## 2021-08-19 ENCOUNTER — Telehealth: Payer: Self-pay

## 2021-08-19 NOTE — Telephone Encounter (Signed)
?  Follow up Call- ? ? ?  08/17/2021  ?  9:55 AM  ?Call back number  ?Post procedure Call Back phone  # [phone]  ?Permission to leave phone message Yes  ?  ? ?Patient questions: ? ?Do you have a fever, pain , or abdominal swelling? No. ?Pain Score  0 * ? ?Have you tolerated food without any problems? Yes.   ? ?Have you been able to return to your normal activities? Yes.   ? ?Do you have any questions about your discharge instructions: ?Diet   No. ?Medications  No. ?Follow up visit  No. ? ?Do you have questions or concerns about your Care? No. ? ?Actions: ?* If pain score is 4 or above: ?No action needed, pain <4. ? ? ?

## 2021-08-22 ENCOUNTER — Encounter: Payer: Self-pay | Admitting: Gastroenterology

## 2021-08-26 ENCOUNTER — Other Ambulatory Visit: Payer: Medicare Other

## 2021-08-26 DIAGNOSIS — D869 Sarcoidosis, unspecified: Secondary | ICD-10-CM

## 2021-08-26 LAB — CBC WITH DIFFERENTIAL/PLATELET
Basophils Absolute: 0 10*3/uL (ref 0.0–0.1)
Basophils Relative: 0.4 % (ref 0.0–3.0)
Eosinophils Absolute: 0 10*3/uL (ref 0.0–0.7)
Eosinophils Relative: 0.7 % (ref 0.0–5.0)
HCT: 42.6 % (ref 36.0–46.0)
Hemoglobin: 14.3 g/dL (ref 12.0–15.0)
Lymphocytes Relative: 12.9 % (ref 12.0–46.0)
Lymphs Abs: 0.7 10*3/uL (ref 0.7–4.0)
MCHC: 33.6 g/dL (ref 30.0–36.0)
MCV: 91.2 fl (ref 78.0–100.0)
Monocytes Absolute: 0.6 10*3/uL (ref 0.1–1.0)
Monocytes Relative: 10.4 % (ref 3.0–12.0)
Neutro Abs: 4.3 10*3/uL (ref 1.4–7.7)
Neutrophils Relative %: 75.6 % (ref 43.0–77.0)
Platelets: 243 10*3/uL (ref 150.0–400.0)
RBC: 4.67 Mil/uL (ref 3.87–5.11)
RDW: 13.6 % (ref 11.5–15.5)
WBC: 5.7 10*3/uL (ref 4.0–10.5)

## 2021-09-02 ENCOUNTER — Ambulatory Visit: Payer: Medicare Other | Admitting: Pulmonary Disease

## 2021-09-02 ENCOUNTER — Telehealth: Payer: Self-pay | Admitting: Pulmonary Disease

## 2021-09-02 ENCOUNTER — Encounter: Payer: Self-pay | Admitting: Pulmonary Disease

## 2021-09-02 VITALS — BP 118/64 | HR 71 | Temp 98.2°F | Ht 64.0 in | Wt 130.0 lb

## 2021-09-02 DIAGNOSIS — D869 Sarcoidosis, unspecified: Secondary | ICD-10-CM

## 2021-09-02 DIAGNOSIS — Z79899 Other long term (current) drug therapy: Secondary | ICD-10-CM

## 2021-09-02 LAB — COMPREHENSIVE METABOLIC PANEL
ALT: 25 U/L (ref 0–35)
AST: 20 U/L (ref 0–37)
Albumin: 4.2 g/dL (ref 3.5–5.2)
Alkaline Phosphatase: 48 U/L (ref 39–117)
BUN: 20 mg/dL (ref 6–23)
CO2: 32 mEq/L (ref 19–32)
Calcium: 9.2 mg/dL (ref 8.4–10.5)
Chloride: 101 mEq/L (ref 96–112)
Creatinine, Ser: 1.07 mg/dL (ref 0.40–1.20)
GFR: 54.56 mL/min — ABNORMAL LOW (ref >60.00)
Glucose, Bld: 97 mg/dL (ref 70–99)
Potassium: 3.1 mEq/L — ABNORMAL LOW (ref 3.5–5.1)
Sodium: 142 mEq/L (ref 135–145)
Total Bilirubin: 0.9 mg/dL (ref 0.2–1.2)
Total Protein: 6.3 g/dL (ref 6.0–8.3)

## 2021-09-02 MED ORDER — AZATHIOPRINE 50 MG PO TABS: 25.0000 mg | ORAL_TABLET | Freq: Every day | ORAL | 0 refills | Status: DC

## 2021-09-02 NOTE — Patient Instructions (Addendum)
--  Continue prednisone 20 mg daily until azathioprine can be started ?--Will start azathioprine at 25 mg. Ok to decrease prednisone to 10 mg daily ?--Pharmacy team will contact you to discuss side effects ?--ORDER labs: TPMT genetics and CMET to monitor liver and kidneys ? ?Follow-up with me June 27th (~8 weeks) ?

## 2021-09-02 NOTE — Progress Notes (Signed)
? ? ?Subjective:  ? ?PATIENT ID: Tamara Myers GENDER: female DOB: 26-Apr-1956, MRN: [id number] ? ? ?HPI ? ?Chief Complaint  ?Patient presents with  ? New Patient (Initial Visit)  ?  New pt from [place]. Pt states Icard was trying to adjust her medications. Pt states that she is on 63m of Prednisone and she is starting to have a raspy voice and is unsure if its the medication. She does feel like the prednisone is helping her.   ? ? ?Reason for Visit: New patient for sarcoid ? ?Tamara Myers is a 66year old female with sarcoidosis, asthma, osteoporosis, GERD, HLD, HTN who presents as a new patient for sarcoid evaluation. ? ?She initially was seen by Dr. IValeta Harmsin 2020 with symptoms of progressive cough and CT demonstrating perilymphatic micro-nodularity and bilateral hilar adenopathy including 1.4 cm hilar lymph node and 1cm RLL nodule consistent with sarcoid. Bronchoscopy on 11/20/18 with granulomatous inflammation. Started on prednisone in August 2020 with clinical improvement and improvement of parenchymal changes and nodularity. Started on methotrexate 10 mg and steroids November 2020. Continue steroids until 01/2020 and maintained only methotrexate with stable respiratory symptoms. Attempted methotrexate taper in fall 2022 however symptoms recurred. Has been controlled on methotrexate on low dosage 5-7.5. Had to switch discontinue in March 2023 due to leukopenia. Currently on prednisone 20 mg with no issues. Denies shortness of breath, cough, wheezing or chest pain. ? ?Social History: ?APress photographerfor [employer] ?Father passed from pulmonary fibrosis ? ?I have personally reviewed patient's past medical/family/social history, allergies, current medications. ? ?Past Medical History:  ?Diagnosis Date  ? Cancer ([facility]   ? basal cell carcinoma  ? Cataract   ? bilateral  ? Complication of anesthesia   ? slow to wake up  ? Dyspnea   ? on exertion  ? GERD (gastroesophageal reflux disease)   ? on meds  ?  Hyperlipidemia   ? on meds  ? Hypertension   ? on meds  ? [place]an[place] ([place] 2018  ? left upper arm  ? Moderate persistent asthma without complication 141/66/0630 ? on meds  ? Osteoporosis   ? confirmed by bone scan  ? PONV (postoperative nausea and vomiting)   ? Post menopausal syndrome   ? [place]   ? tx'd  ?  ? ?Family History  ?Problem Relation Age of Onset  ? Osteoporosis Mother   ? Diabetes Father   ? Heart disease Father   ? Hyperlipidemia Father   ? Hypertension Father   ? Pulmonary fibrosis Father   ? Asthma Daughter   ? Breast cancer Neg Hx   ? Allergic rhinitis Neg Hx   ? Eczema Neg Hx   ? Urticaria Neg Hx   ? Angioedema Neg Hx   ? Colon polyps Neg Hx   ? Colon cancer Neg Hx   ? Esophageal cancer Neg Hx   ? Rectal cancer Neg Hx   ? Stomach cancer Neg Hx   ?  ? ?Social History  ? ?Occupational History  ? Not on file  ?Tobacco Use  ? Smoking status: Never  ? Smokeless tobacco: Never  ?Vaping Use  ? Vaping Use: Never used  ?Substance and Sexual Activity  ? Alcohol use: No  ? Drug use: No  ? Sexual activity: Not on file  ? ? ?No Known Allergies  ? ?Outpatient Medications Prior to Visit  ?Medication Sig Dispense Refill  ? albuterol (PROVENTIL HFA;VENTOLIN HFA) 108 (90 Base) MCG/ACT inhaler  Inhale 2 puffs into the lungs every 6 (six) hours as needed for wheezing or shortness of breath. 18 g 1  ? amLODipine (NORVASC) 5 MG tablet Take 1 tablet (5 mg total) by mouth daily. 90 tablet 1  ? fluticasone-salmeterol (ADVAIR HFA) 115-21 MCG/ACT inhaler Inhale 2 puffs into the lungs 2 (two) times daily. 12 g 12  ? folic acid (FOLVITE) 1 MG tablet Take 1 tablet (1 mg total) by mouth daily. 90 tablet 1  ? pantoprazole (PROTONIX) 40 MG tablet Take 1 tablet (40 mg total) by mouth daily. 90 tablet 3  ? predniSONE (DELTASONE) 20 MG tablet Take 1 tablet (20 mg total) by mouth daily with breakfast. 90 tablet 0  ? rosuvastatin (CRESTOR) 10 MG tablet Take 1 tablet (10 mg total) by mouth daily at 6 PM. 90 tablet 1  ?  triamterene-hydrochlorothiazide (MAXZIDE-25) 37.5-25 MG tablet TAKE ONE TABLET BY MOUTH DAILY 90 tablet 1  ? Zoledronic Acid (RECLAST IV) Inject into the vein.    ? methotrexate (RHEUMATREX) 2.5 MG tablet Take 3 tablets by mouth once weekly Caution:Chemotherapy. Protect from light. (Patient taking differently: Take 5 mg by mouth once a week. Take 2 tablets by mouth once weekly Caution:Chemotherapy. Protect from light.) 36 tablet 1  ? ?No facility-administered medications prior to visit.  ? ? ?Review of Systems  ?Constitutional:  Negative for chills, diaphoresis, fever, malaise/fatigue and weight loss.  ?HENT:  Negative for congestion.   ?Respiratory:  Negative for cough, hemoptysis, sputum production, shortness of breath and wheezing.   ?Cardiovascular:  Negative for chest pain, palpitations and leg swelling.  ? ? ?Objective:  ? ?Vitals:  ? 09/02/21 0857  ?BP: 118/64  ?Pulse: 71  ?Temp: 98.2 ?F (36.8 ?C)  ?TempSrc: Oral  ?SpO2: 99%  ?Weight: 130 lb (59 kg)  ?Height: _0  (1.626 m)  ? ?SpO2: 99 % ?O2 Device: None (Room air) ? ?Physical Exam: ?General: Well-appearing, no acute distress ?HENT: [place], AT ?Eyes: EOMI, no scleral icterus ?Respiratory: Clear to auscultation bilaterally.  No crackles, wheezing or rales ?Cardiovascular: RRR, -M/R/G, no JVD ?Extremities:-Edema,-tenderness ?Neuro: AAO x4, CNII-XII grossly intact ?Psych: Normal mood, normal affect ? ?Data Reviewed: ? ?Imaging: ? ?CT Chest 07/02/18 - Bilateral nodularity with perilymphatic distribution including 1cm RLL nodule. Right hilar enlargement. ? ?CT Chest 10/18/18 - Progressive bilateral nodularity including RLL nodule ~549m ? ?CT Chest 03/10/19 - Improved parenchymal changes with mild disease in RML, lingular and superior LLL ? ?CT Chest HR 06/30/19 - Improved parenchymal changes in setting of immunosuppression. RLL 5 mm subpleural nodule. ? ?CT Chest HR 07/15/20 - No significant parenchymal abnormalities or consolidations seen. Stable calcified mediastinal and  bilateral hilar nodes. New 229msolid RUL nodule likely benign. Stable peripheral RUL GGO stable and RLL 49m10module stable since 06/2019. ? ?PFT: ?01/23/18 ?FVC 2.53 (77%) FEV1 2.19 (86%) Ratio 87% ?Interpretation: No obstructive lung disease. Reduced FVC and FEV1 ? ?04/04/18 ?FVC 2.59 (78%) FEV1 1.93 (76%) Ratio 75% ?Interpretation: No obstructive lung disease. Reduced FVC and FEV1. ? ?05/09/18 ?FVC 2.43 (73%) FEV1 1.6 (63%) Ratio 66% ?Interpretation: Moderate obstructive lung disease ? ?11/06/18 ?FVC 2.92 (89%) FEV1 2.04 (74%) Ratio 74  (Pred 78) TLC 93% DLCO 100%. F-V loops suggestive of small airway disease ?Interpretation: Mild obstructive defect. No significant BD ? ?03/17/20 ?FVC 2.53 (79%) FEV1 1.96 (80%) Ratio 65  TLC 87% DLCO 101%. Significant BD response in FEV1 ?Interpretation: Mild obstructive defect with significant bronchodilator response ? ? ?  Latest Ref Rng & Units  08/26/2021  ?  9:43 AM 08/10/2021  ? 10:01 AM 07/25/2021  ? 10:05 AM  ?CBC  ?WBC 4.0 - 10.5 K/uL 5.7   10.6   1.8 Repeated and verified X2.    ?Hemoglobin 12.0 - 15.0 g/dL 14.3   13.8   13.4    ?Hematocrit 36.0 - 46.0 % 42.6   40.7   39.6    ?Platelets 150.0 - 400.0 K/uL 243.0   266.0   207.0    ?Improved WBC after discontinuation of methotrexate on 07/27/21 ? ? ? ?  Latest Ref Rng & Units 09/02/2021  ?  9:24 AM 07/25/2021  ? 10:05 AM 05/10/2021  ?  7:50 AM  ?BMP  ?Glucose 70 - 99 mg/dL 97   85   85    ?BUN 6 - 23 mg/dL _0 ?Creatinine 0.40 - 1.20 mg/dL 1.07   0.96   0.89    ?Sodium 135 - 145 mEq/L 142   142   141    ?Potassium 3.5 - 5.1 mEq/L 3.1   3.5   3.6    ?Chloride 96 - 112 mEq/L 101   104   101    ?CO2 19 - 32 mEq/L 32   32   31    ?Calcium 8.4 - 10.5 mg/dL 9.2   9.5   9.5    ? ? ? ? ?  Latest Ref Rng & Units 09/02/2021  ?  9:24 AM 07/25/2021  ? 10:05 AM 01/07/2021  ?  9:13 AM  ?Hepatic Function  ?Total Protein 6.0 - 8.3 g/dL 6.3   6.4   6.2    ?Albumin 3.5 - 5.2 g/dL 4.2   4.5     ?AST 0 - 37 U/L 20   34   25    ?ALT 0 - 35 U/L 25   33    23    ?Alk Phosphatase 39 - 117 U/L 48   45     ?Total Bilirubin 0.2 - 1.2 mg/dL 0.9   0.8   0.6    ? ?Assessment & Plan:  ? ?Discussion: ?66 year old female with sarcoidosis, asthma, osteoporosis,

## 2021-09-02 NOTE — Telephone Encounter (Signed)
Pharmacy Note ? ?Subjective: ?Patient called today by [facility] team for counseling on azathioprine (Imuran) for sarcoidosis.  Previous therapy includes:methotrexate (caused leukopenia). Last dose was on 07/27/21 ?Objective: ?CMP  ?   ?Component Value Date/Time  ? NA 142 09/02/2021 0924  ? NA 142 07/15/2019 0936  ? K 3.1 (L) 09/02/2021 6759  ? CL 101 09/02/2021 0924  ? CO2 32 09/02/2021 0924  ? GLUCOSE 97 09/02/2021 0924  ? BUN 20 09/02/2021 0924  ? BUN 13 07/15/2019 0936  ? CREATININE 1.07 09/02/2021 0924  ? CREATININE 0.85 01/07/2021 0913  ? CALCIUM 9.2 09/02/2021 0924  ? PROT 6.3 09/02/2021 0924  ? ALBUMIN 4.2 09/02/2021 0924  ? AST 20 09/02/2021 0924  ? ALT 25 09/02/2021 0924  ? ALKPHOS 48 09/02/2021 0924  ? BILITOT 0.9 09/02/2021 0924  ? GFRNONAA 46 (L) 01/06/2020 0908  ? GFRAA 53 (L) 01/06/2020 0908  ? ? ?CBC ?   ?Component Value Date/Time  ? WBC 5.7 08/26/2021 0943  ? RBC 4.67 08/26/2021 0943  ? HGB 14.3 08/26/2021 0943  ? HCT 42.6 08/26/2021 0943  ? PLT 243.0 08/26/2021 0943  ? MCV 91.2 08/26/2021 0943  ? MCV 88.1 07/29/2011 1126  ? MCH 31.6 01/07/2021 0913  ? MCHC 33.6 08/26/2021 0943  ? RDW 13.6 08/26/2021 0943  ? LYMPHSABS 0.7 08/26/2021 0943  ? MONOABS 0.6 08/26/2021 0943  ? EOSABS 0.0 08/26/2021 0943  ? BASOSABS 0.0 08/26/2021 0943  ? ? ?Baseline Immunosuppressant Therapy Labs ?TB GOLD ? ?  Latest Ref Rng & Units 03/12/2019  ?  9:53 AM  ?Quantiferon TB Gold  ?Quantiferon TB Gold Plus NEGATIVE NEGATIVE    ? ?Hepatitis Panel ? ?  Latest Ref Rng & Units 03/12/2019  ?  9:53 AM  ?Hepatitis  ?Hep B Surface Ag NON-REACTI NON-REACTIVE    ?Hep C Ab NON-REACTI NON-REACTIVE    ? ?HIV ?No results found for: HIV ?Immunoglobulins ?  ?SPEP ? ?  Latest Ref Rng & Units 09/02/2021  ?  9:24 AM  ?Serum Protein Electrophoresis  ?Total Protein 6.0 - 8.3 g/dL 6.3    ? ?G6PD ?No results found for: G6PDH ?TPMT ?No results found for: TPMT  ? ?HRCT (07/15/20) - No significant recurrent/progressive pulmonary  parenchymal ?findings of sarcoidosis. Minimal peripheral right upper lobe ground-glass opacity is stable. Indistinct anterior right lower lobe 0.5 cm pulmonary nodule is stable ? ?Assessment/Plan: ?Patient was counseled on the purpose, proper use, and adverse effects of azathioprine including risk of infection, nausea, rash, and hair loss. Also informed that medication can cause discoloration of urine, sweat and tears. Reviewed risk of cancer after long term use.  Discussed risk of myelosupression and reviewed importance of frequent lab work to monitor blood counts.  Standing orders placed.  Reviewed drug-drug interactions including contraindication with allopurinol.  Provided patient with educational materials on azathioprine and answered all questions.  ? ?Reviewed her medication list - no significant drug drug interactions.  ? ?Patient dose will be '25mg'$  once daily (half tab of '50mg'$  tab daily). Recheck labs in one month.   ? ?TPMT drawn today and pending. She will remain on azathioprine '25mg'$  daily until repeat CBC and pending clinical follow-up by Dr. Loanne Drilling on 10/25/21. ? ?Knox Saliva, PharmD, MPH, BCPS, CPP ?Clinical Pharmacist (Rheumatology and Pulmonology) ?

## 2021-09-02 NOTE — Telephone Encounter (Signed)
Please start patient on azathioprine 25 mg daily while we await TPMT genetics. She has been off methotrexate since 07/27/21. ? ?Once she starts azathioprine, I have advised her to taper prednisone to 10 mg daily.  ? ?She will be scheduled to follow-up with me at the end of June. Please let her know if she needs to come in sooner for labs. ?

## 2021-09-07 ENCOUNTER — Telehealth: Payer: Self-pay | Admitting: Pulmonary Disease

## 2021-09-07 ENCOUNTER — Encounter: Payer: Self-pay | Admitting: Endocrinology

## 2021-09-07 ENCOUNTER — Other Ambulatory Visit (HOSPITAL_COMMUNITY): Payer: Self-pay

## 2021-09-09 ENCOUNTER — Encounter: Payer: Self-pay | Admitting: Pulmonary Disease

## 2021-09-09 LAB — TPMT GENETIC TEST

## 2021-09-12 ENCOUNTER — Other Ambulatory Visit (HOSPITAL_COMMUNITY): Payer: Self-pay

## 2021-09-12 NOTE — Telephone Encounter (Signed)
Submitted a Prior Authorization request to [facility] for  azathioprine  via CoverMyMeds. Will update once we receive a response. ? ?Key: [id number] ? ?Knox Saliva, PharmD, MPH, BCPS, CPP ?Clinical Pharmacist (Rheumatology and Pulmonology) ?

## 2021-09-12 NOTE — Telephone Encounter (Signed)
Received notification from [facility] regarding a prior authorization for  azathioprine  (for #15 tabs per 30 day supply). Authorization has been APPROVED from 09/12/21 through 09/13/22 ? ?Unable to run test claim with rejection stating PA is still required. Will have to try to run test claim later. ? ?Authorization # [id number] ? ?Knox Saliva, PharmD, MPH, BCPS, CPP ?Clinical Pharmacist (Rheumatology and Pulmonology) ? ?

## 2021-09-15 NOTE — Telephone Encounter (Signed)
Tamara Myers can take up 24-72 hours before it will process at the pharmacy including test billing.

## 2021-09-23 ENCOUNTER — Other Ambulatory Visit (HOSPITAL_COMMUNITY): Payer: Self-pay

## 2021-09-23 NOTE — Telephone Encounter (Signed)
Per test claim for azathioprine, patient has no copay for 30 day supply (#15 tab). Called [facility] to reprocess rx and have it ready for patient.  They used discount card for first fill (cost was $15 for patient) but will able to process subsequent fills through insurance by default. Closing encounter.  Knox Saliva, PharmD, MPH, BCPS, CPP Clinical Pharmacist (Rheumatology and Pulmonology)

## 2021-10-06 ENCOUNTER — Other Ambulatory Visit: Payer: Self-pay | Admitting: Pulmonary Disease

## 2021-10-14 ENCOUNTER — Other Ambulatory Visit (INDEPENDENT_AMBULATORY_CARE_PROVIDER_SITE_OTHER): Payer: Medicare Other

## 2021-10-14 DIAGNOSIS — Z79899 Other long term (current) drug therapy: Secondary | ICD-10-CM | POA: Diagnosis not present

## 2021-10-14 DIAGNOSIS — D869 Sarcoidosis, unspecified: Secondary | ICD-10-CM

## 2021-10-14 LAB — COMPREHENSIVE METABOLIC PANEL
ALT: 24 U/L (ref 0–35)
AST: 29 U/L (ref 0–37)
Albumin: 4.1 g/dL (ref 3.5–5.2)
Alkaline Phosphatase: 42 U/L (ref 39–117)
BUN: 13 mg/dL (ref 6–23)
CO2: 32 mEq/L (ref 19–32)
Calcium: 9.7 mg/dL (ref 8.4–10.5)
Chloride: 102 mEq/L (ref 96–112)
Creatinine, Ser: 1.07 mg/dL (ref 0.40–1.20)
GFR: 54.51 mL/min — ABNORMAL LOW (ref >60.00)
Glucose, Bld: 94 mg/dL (ref 70–99)
Potassium: 3 mEq/L — ABNORMAL LOW (ref 3.5–5.1)
Sodium: 143 mEq/L (ref 135–145)
Total Bilirubin: 0.8 mg/dL (ref 0.2–1.2)
Total Protein: 6.1 g/dL (ref 6.0–8.3)

## 2021-10-14 LAB — CBC WITH DIFFERENTIAL/PLATELET
Basophils Absolute: 0 10*3/uL (ref 0.0–0.1)
Basophils Relative: 0.3 % (ref 0.0–3.0)
Eosinophils Absolute: 0 10*3/uL (ref 0.0–0.7)
Eosinophils Relative: 1.2 % (ref 0.0–5.0)
HCT: 39.9 % (ref 36.0–46.0)
Hemoglobin: 13.5 g/dL (ref 12.0–15.0)
Lymphocytes Relative: 12.7 % (ref 12.0–46.0)
Lymphs Abs: 0.5 10*3/uL — ABNORMAL LOW (ref 0.7–4.0)
MCHC: 33.9 g/dL (ref 30.0–36.0)
MCV: 90.6 fl (ref 78.0–100.0)
Monocytes Absolute: 0.4 10*3/uL (ref 0.1–1.0)
Monocytes Relative: 8.9 % (ref 3.0–12.0)
Neutro Abs: 3.2 10*3/uL (ref 1.4–7.7)
Neutrophils Relative %: 76.9 % (ref 43.0–77.0)
Platelets: 200 10*3/uL (ref 150.0–400.0)
RBC: 4.4 Mil/uL (ref 3.87–5.11)
RDW: 14.3 % (ref 11.5–15.5)
WBC: 4.1 10*3/uL (ref 4.0–10.5)

## 2021-10-24 ENCOUNTER — Other Ambulatory Visit: Payer: Self-pay | Admitting: Pulmonary Disease

## 2021-10-24 DIAGNOSIS — D869 Sarcoidosis, unspecified: Secondary | ICD-10-CM

## 2021-10-25 ENCOUNTER — Encounter: Payer: Self-pay | Admitting: Pulmonary Disease

## 2021-10-25 ENCOUNTER — Other Ambulatory Visit (HOSPITAL_COMMUNITY): Payer: Self-pay

## 2021-10-25 ENCOUNTER — Ambulatory Visit: Payer: Medicare Other | Admitting: Pulmonary Disease

## 2021-10-25 VITALS — BP 118/78 | HR 74 | Temp 98.1°F | Ht 64.0 in | Wt 131.0 lb

## 2021-10-25 DIAGNOSIS — Z79899 Other long term (current) drug therapy: Secondary | ICD-10-CM

## 2021-10-25 DIAGNOSIS — D869 Sarcoidosis, unspecified: Secondary | ICD-10-CM | POA: Diagnosis not present

## 2021-10-25 MED ORDER — AZATHIOPRINE 50 MG PO TABS
50.0000 mg | ORAL_TABLET | Freq: Every day | ORAL | 2 refills | Status: DC
Start: 2021-10-25 — End: 2022-01-19

## 2021-11-10 ENCOUNTER — Ambulatory Visit: Payer: BC Managed Care – PPO | Admitting: Endocrinology

## 2021-11-13 ENCOUNTER — Other Ambulatory Visit: Payer: Self-pay | Admitting: Pulmonary Disease

## 2021-11-14 ENCOUNTER — Other Ambulatory Visit: Payer: Self-pay | Admitting: Internal Medicine

## 2021-11-16 ENCOUNTER — Encounter: Payer: Self-pay | Admitting: Pulmonary Disease

## 2021-11-16 ENCOUNTER — Ambulatory Visit (INDEPENDENT_AMBULATORY_CARE_PROVIDER_SITE_OTHER): Payer: Medicare Other | Admitting: Pulmonary Disease

## 2021-11-16 VITALS — BP 128/80 | HR 77 | Ht 64.0 in | Wt 129.5 lb

## 2021-11-16 DIAGNOSIS — R21 Rash and other nonspecific skin eruption: Secondary | ICD-10-CM

## 2021-11-16 DIAGNOSIS — D869 Sarcoidosis, unspecified: Secondary | ICD-10-CM

## 2021-11-16 DIAGNOSIS — Z79899 Other long term (current) drug therapy: Secondary | ICD-10-CM | POA: Diagnosis not present

## 2021-11-16 LAB — COMPREHENSIVE METABOLIC PANEL
ALT: 34 U/L (ref 0–35)
AST: 34 U/L (ref 0–37)
Albumin: 4.7 g/dL (ref 3.5–5.2)
Alkaline Phosphatase: 56 U/L (ref 39–117)
BUN: 16 mg/dL (ref 6–23)
CO2: 31 mEq/L (ref 19–32)
Calcium: 9.6 mg/dL (ref 8.4–10.5)
Chloride: 101 mEq/L (ref 96–112)
Creatinine, Ser: 0.94 mg/dL (ref 0.40–1.20)
GFR: 63.64 mL/min (ref >60.00)
Glucose, Bld: 96 mg/dL (ref 70–99)
Potassium: 3.3 mEq/L — ABNORMAL LOW (ref 3.5–5.1)
Sodium: 141 mEq/L (ref 135–145)
Total Bilirubin: 0.6 mg/dL (ref 0.2–1.2)
Total Protein: 6.7 g/dL (ref 6.0–8.3)

## 2021-11-16 LAB — CBC WITH DIFFERENTIAL/PLATELET
Basophils Absolute: 0 10*3/uL (ref 0.0–0.1)
Basophils Relative: 0.5 % (ref 0.0–3.0)
Eosinophils Absolute: 0 10*3/uL (ref 0.0–0.7)
Eosinophils Relative: 0.9 % (ref 0.0–5.0)
HCT: 41.1 % (ref 36.0–46.0)
Hemoglobin: 13.8 g/dL (ref 12.0–15.0)
Lymphocytes Relative: 11.1 % — ABNORMAL LOW (ref 12.0–46.0)
Lymphs Abs: 0.5 10*3/uL — ABNORMAL LOW (ref 0.7–4.0)
MCHC: 33.6 g/dL (ref 30.0–36.0)
MCV: 91.5 fl (ref 78.0–100.0)
Monocytes Absolute: 0.5 10*3/uL (ref 0.1–1.0)
Monocytes Relative: 11.1 % (ref 3.0–12.0)
Neutro Abs: 3.4 10*3/uL (ref 1.4–7.7)
Neutrophils Relative %: 76.4 % (ref 43.0–77.0)
Platelets: 247 10*3/uL (ref 150.0–400.0)
RBC: 4.49 Mil/uL (ref 3.87–5.11)
RDW: 14.7 % (ref 11.5–15.5)
WBC: 4.5 10*3/uL (ref 4.0–10.5)

## 2021-11-16 MED ORDER — PREDNISONE 5 MG PO TABS: 5.0000 mg | ORAL_TABLET | Freq: Every day | ORAL | 0 refills | Status: AC

## 2021-11-16 NOTE — Progress Notes (Signed)
Subjective:   PATIENT ID: Jaleigha S Mcclenathan GENDER: female DOB: [birth year], MRN: [id number]   HPI  Chief Complaint  Patient presents with   Follow-up    Pt f/u, typical SOB on exertion does express she coughs some. Possible rash from the Imuran    Reason for Visit: Follow-up sarcoid  Ms. Teralyn Theiss is a 66 year old female with sarcoidosis, asthma, osteoporosis, GERD, HLD, HTN who presents for follow-up  Synopsis: She initially was seen by Dr. Valeta Harms in 2020 with symptoms of progressive cough and CT demonstrating perilymphatic micro-nodularity and bilateral hilar adenopathy including 1.4 cm hilar lymph node and 1cm RLL nodule consistent with sarcoid. Bronchoscopy on 11/20/18 with granulomatous inflammation. Started on prednisone in August 2020 with clinical improvement and improvement of parenchymal changes and nodularity. Started on methotrexate 10 mg and steroids November 2020. Continue steroids until 01/2020 and maintained only methotrexate with stable respiratory symptoms. Attempted methotrexate taper in fall 2022 however symptoms recurred. Has been controlled on methotrexate on low dosage 5-7.5. Had to discontinue in March 2023 due to leukopenia. Had to be restarted on prednisone. Started on azathioprine May 2023 and started on prednisone taper.  10/25/21 Since our last visit she was transitioned to azathioprine. She has tolerated medication well. Denies any adverse effects as previously discussed (nausea, rash, hair lose and discoloration of urine and tears). She reports hypokalemia and bruising related to steroid use. She eats lots of bananas and spinach. No reported leg pain or cramps  11/16/21 She reports well-controlled cough and shortness of breath on azathioprine 50 mg. Still on prednisone 10 mg. 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 Father passed from pulmonary  fibrosis  Past Medical History:  Diagnosis Date   Cancer ([place])    basal cell carcinoma   Cataract    bilateral   Complication of anesthesia    slow to wake up   Dyspnea    on exertion   GERD (gastroesophageal reflux disease)    on meds   Hyperlipidemia    on meds   Hypertension    on meds   Melanoma ([place]) 2018   left upper arm   Moderate persistent asthma without complication 88/32/5498   on meds   Osteoporosis    confirmed by bone scan   PONV (postoperative nausea and vomiting)    Post menopausal syndrome    Pulmonary sarcoidosis ([place])    tx'd     Family History  Problem Relation Age of Onset   Osteoporosis Mother    Diabetes Father    Heart disease Father    Hyperlipidemia Father    Hypertension Father    Pulmonary fibrosis Father    Asthma Daughter    Breast cancer Neg Hx    Allergic rhinitis Neg Hx    Eczema Neg Hx    Urticaria Neg Hx    Angioedema Neg Hx    Colon polyps Neg Hx    Colon cancer Neg Hx    Esophageal cancer Neg Hx    Rectal cancer Neg Hx    Stomach cancer Neg Hx      Social History   Occupational History   Not on file  Tobacco Use   Smoking status: Never   Smokeless tobacco: Never  Vaping Use   Vaping Use: Never used  Substance and Sexual Activity   Alcohol use: No   Drug use: No   Sexual activity:  Not on file    No Known Allergies   Outpatient Medications Prior to Visit  Medication Sig Dispense Refill   ADVAIR HFA 115-21 MCG/ACT inhaler INHALE TWO PUFFS INTO THE LUNGS TWICE A DAY 12 g 12   albuterol (PROVENTIL HFA;VENTOLIN HFA) 108 (90 Base) MCG/ACT inhaler Inhale 2 puffs into the lungs every 6 (six) hours as needed for wheezing or shortness of breath. 18 g 1   amLODipine (NORVASC) 5 MG tablet Take 1 tablet (5 mg total) by mouth daily. 90 tablet 1   azaTHIOprine (IMURAN) 50 MG tablet Take 1 tablet (50 mg total) by mouth daily. 30 tablet 2   nystatin (MYCOSTATIN) 100000 UNIT/ML suspension Take by mouth.     pantoprazole  (PROTONIX) 40 MG tablet Take 1 tablet (40 mg total) by mouth daily. 90 tablet 3   rosuvastatin (CRESTOR) 10 MG tablet TAKE ONE TABLET BY MOUTH DAILY AT 6PM 90 tablet 3   triamterene-hydrochlorothiazide (MAXZIDE-25) 37.5-25 MG tablet TAKE ONE TABLET BY MOUTH DAILY 90 tablet 1   Zoledronic Acid (RECLAST IV) Inject into the vein.     No facility-administered medications prior to visit.    Review of Systems  Constitutional:  Negative for chills, diaphoresis, fever, malaise/fatigue and weight loss.  HENT:  Negative for congestion.   Respiratory:  Negative for cough, hemoptysis, sputum production, shortness of breath and wheezing.   Cardiovascular:  Negative for chest pain, palpitations and leg swelling.  Skin:  Positive for itching and rash.     Objective:   Vitals:   11/16/21 0932  BP: 128/80  Pulse: 77  SpO2: 98%  Weight: 129 lb 8 oz (58.7 kg)  Height: 5' 4"  (1.626 m)      Physical Exam: General: Well-appearing, no acute distress HENT: [place], AT Eyes: EOMI, no scleral icterus Respiratory: Clear to auscultation bilaterally.  No crackles, wheezing or rales Cardiovascular: RRR, -M/R/G, no JVD Extremities:-Edema,-tenderness Neuro: AAO x4, CNII-XII grossly intact Psych: Normal mood, normal affect Skin: Mild maculopapular rash scattered in 7 cm region on upper mid abdomen   Data Reviewed:  Imaging:  CT Chest 07/02/18 - Bilateral nodularity with perilymphatic distribution including 1cm RLL nodule. Right hilar enlargement.  CT Chest 10/18/18 - Progressive bilateral nodularity including RLL nodule ~23m  CT Chest 03/10/19 - Improved parenchymal changes with mild disease in RML, lingular and superior LLL  CT Chest HR 06/30/19 - Improved parenchymal changes in setting of immunosuppression. RLL 5 mm subpleural nodule.  CT Chest HR 07/15/20 - No significant parenchymal abnormalities or consolidations seen. Stable calcified mediastinal and bilateral hilar nodes. New 279msolid RUL nodule  likely benign. Stable peripheral RUL GGO stable and RLL 57m77module stable since 06/2019.  PFT: 01/23/18 FVC 2.53 (77%) FEV1 2.19 (86%) Ratio 87% Interpretation: No obstructive lung disease. Reduced FVC and FEV1  04/04/18 FVC 2.59 (78%) FEV1 1.93 (76%) Ratio 75% Interpretation: No obstructive lung disease. Reduced FVC and FEV1.  05/09/18 FVC 2.43 (73%) FEV1 1.6 (63%) Ratio 66% Interpretation: Moderate obstructive lung disease  11/06/18 FVC 2.92 (89%) FEV1 2.04 (74%) Ratio 74  (Pred 78) TLC 93% DLCO 100%. F-V loops suggestive of small airway disease Interpretation: Mild obstructive defect. No significant BD  03/17/20 FVC 2.53 (79%) FEV1 1.96 (80%) Ratio 65  TLC 87% DLCO 101%. Significant BD response in FEV1 Interpretation: Mild obstructive defect with significant bronchodilator response     Latest Ref Rng & Units 10/14/2021    9:28 AM 08/26/2021    9:43 AM 08/10/2021   10:01 AM  CBC  WBC 4.0 - 10.5 K/uL 4.1  5.7  10.6   Hemoglobin 12.0 - 15.0 g/dL 13.5  14.3  13.8   Hematocrit 36.0 - 46.0 % 39.9  42.6  40.7   Platelets 150.0 - 400.0 K/uL 200.0  243.0  266.0   Improved WBC after discontinuation of methotrexate on 07/27/21      Latest Ref Rng & Units 10/14/2021    9:28 AM 09/02/2021    9:24 AM 07/25/2021   10:05 AM  BMP  Glucose 70 - 99 mg/dL 94  97  85   BUN 6 - 23 mg/dL 13  20  14    Creatinine 0.40 - 1.20 mg/dL 1.07  1.07  0.96   Sodium 135 - 145 mEq/L 143  142  142   Potassium 3.5 - 5.1 mEq/L 3.0  3.1  3.5   Chloride 96 - 112 mEq/L 102  101  104   CO2 19 - 32 mEq/L 32  32  32   Calcium 8.4 - 10.5 mg/dL 9.7  9.2  9.5         Latest Ref Rng & Units 10/14/2021    9:28 AM 09/02/2021    9:24 AM 07/25/2021   10:05 AM  Hepatic Function  Total Protein 6.0 - 8.3 g/dL 6.1  6.3  6.4   Albumin 3.5 - 5.2 g/dL 4.1  4.2  4.5   AST 0 - 37 U/L 29  20  34   ALT 0 - 35 U/L 24  25  33   Alk Phosphatase 39 - 117 U/L 42  48  45   Total Bilirubin 0.2 - 1.2 mg/dL 0.8  0.9  0.8    Assessment &  Plan:   Discussion: 66 year old female with sarcoidosis, asthma, osteoporosis, GERD, HLD, HTN who presents for follow-up for sarcoid management.  We discussed the clinical course of sarcoid and management including serial PFTs, labs, eye exam, and EKG and chest imaging if indicated. She has had recurrent symptoms off methotrexate. Recently tolerating increased azathioprine dose and prednisone. Discussed prednisone wean. Has minor rash however unclear if related to azathioprine. Continue to monitor with supportive care as noted below.  Pulmonary sarcoidosis --Dx in 11/20/18 via bronchoscopy  History of immunosuppression High risk medication management --QuantiFERON-TB 03/12/19: neg  --Prednisone 11/2018>01/2020 --Methotrexate 03/2019>06/2021. D/C'd due to leukopenia --Prednisone 06/2021>currently --Azathioprine 09/09/21 --CONTINUE azathioprine 50 mg daily --Decrease to prednisone 5 mg daily x 2 weeks, then STOP --IF symptoms including dizziness, low blood pressure, nausea, ok to restart prednisone 2.5 mg or 5 mg. Please call us if this occurs --Recommend PPI daily when taking steroids --Routine labs q3 months --ORDER CBC with diff and CMET today  Sarcoid Monitoring --Recent chest imaging reviewed --Annual PFTs.  Last PFTs 03/17/20 --Annual ophthalmology exam.  Last visit on 06/06/21. No active disease --MR Cardiac 11/28/19. No active disease --Monitor routine labs: CBC with diff, CMET, 1, 25 and 25 hydroxy vitamin D, urinary calcium  Hypokalemia --Patient reports usually related to prednisone use. May resolve as we decrease prednisone dose --Discussed potassium rich diet --Labs today. If still low will start repletion   Health Maintenance Immunization History  Administered Date(s) Administered   Influenza Inj Mdck Quad Pf 02/13/2014, 01/21/2021   Influenza Inj Mdck Quad With Preservative 02/13/2014   Influenza Split 02/13/2013   Influenza, Seasonal, Injecte, Preservative Fre  02/13/2013   Influenza,inj,Quad PF,6+ Mos 12/21/2016, 12/25/2017, 01/02/2019, 01/08/2020   Influenza,trivalent, recombinat, inj, PF 02/09/2011, 02/15/2012   PFIZER(Purple Top)SARS-COV-2  Vaccination 07/03/2019, 08/03/2019, 02/26/2020, 01/21/2021   PNEUMOCOCCAL CONJUGATE-20 01/10/2021   Tdap 03/16/2009, 01/15/2020    Orders Placed This Encounter  Procedures   CBC w/Diff    Standing Status:   Future    Number of Occurrences:   1    Standing Expiration Date:   11/16/2022   Comp Met (CMET)    Standing Status:   Future    Number of Occurrences:   1    Standing Expiration Date:   11/16/2022   Meds ordered this encounter  Medications   predniSONE (DELTASONE) 5 MG tablet    Sig: Take 1 tablet (5 mg total) by mouth daily with breakfast for 14 days.    Dispense:  14 tablet    Refill:  0    Return in about 2 months (around 01/17/2022).   I have spent a total time of 35-minutes on the day of the appointment including chart review, data review, collecting history, coordinating care and discussing medical diagnosis and plan with the patient/family. Past medical history, allergies, medications were reviewed. Pertinent imaging, labs and tests included in this note have been reviewed and interpreted independently by me.  [place], MD [facility] 11/16/2021 8:23 AM  Office Number [phone]

## 2021-11-16 NOTE — Patient Instructions (Addendum)
Pulmonary sarcoidosis Hypokalemia --CONTINUE azathioprine to 50 mg daily --Decrease to prednisone 5 mg daily x 2 weeks, then STOP --IF symptoms including dizziness, low blood pressure, nausea, ok to restart prednisone 2.5 mg or 5 mg. Please call us if this occurs --Recommend PPI daily when taking steroids --ORDER CBC with diff and CMET today  Rash --OK to use benadryl cream or hydrocortisone cream as needed --Aquaphor for moisturizing as needed  Follow-up with me in 2 months

## 2022-01-13 ENCOUNTER — Other Ambulatory Visit: Payer: Medicare Other

## 2022-01-13 DIAGNOSIS — R5383 Other fatigue: Secondary | ICD-10-CM | POA: Diagnosis not present

## 2022-01-13 DIAGNOSIS — E7800 Pure hypercholesterolemia, unspecified: Secondary | ICD-10-CM

## 2022-01-13 DIAGNOSIS — R7989 Other specified abnormal findings of blood chemistry: Secondary | ICD-10-CM

## 2022-01-13 DIAGNOSIS — I10 Essential (primary) hypertension: Secondary | ICD-10-CM

## 2022-01-14 LAB — CBC WITH DIFFERENTIAL/PLATELET
Absolute Monocytes: 251 cells/uL (ref 200–950)
Basophils Absolute: 21 cells/uL (ref 0–200)
Basophils Relative: 0.9 %
Eosinophils Absolute: 242 cells/uL (ref 15–500)
Eosinophils Relative: 10.5 %
HCT: 37.5 % (ref 35.0–45.0)
Hemoglobin: 13.1 g/dL (ref 11.7–15.5)
Lymphs Abs: 432 cells/uL — ABNORMAL LOW (ref 850–3900)
MCH: 31.6 pg (ref 27.0–33.0)
MCHC: 34.9 g/dL (ref 32.0–36.0)
MCV: 90.6 fL (ref 80.0–100.0)
MPV: 11.1 fL (ref 7.5–12.5)
Monocytes Relative: 10.9 %
Neutro Abs: 1355 cells/uL — ABNORMAL LOW (ref 1500–7800)
Neutrophils Relative %: 58.9 %
Platelets: 212 10*3/uL (ref 140–400)
RBC: 4.14 10*6/uL (ref 3.80–5.10)
RDW: 11.3 % (ref 11.0–15.0)
Total Lymphocyte: 18.8 %
WBC: 2.3 10*3/uL — ABNORMAL LOW (ref 3.8–10.8)

## 2022-01-14 LAB — COMPLETE METABOLIC PANEL WITH GFR
AG Ratio: 2.8 (calc) — ABNORMAL HIGH (ref 1.0–2.5)
ALT: 14 U/L (ref 6–29)
AST: 26 U/L (ref 10–35)
Albumin: 4.4 g/dL (ref 3.6–5.1)
Alkaline phosphatase (APISO): 37 U/L (ref 37–153)
BUN: 14 mg/dL (ref 7–25)
CO2: 30 mmol/L (ref 20–32)
Calcium: 9.4 mg/dL (ref 8.6–10.4)
Chloride: 104 mmol/L (ref 98–110)
Creat: 0.84 mg/dL (ref 0.50–1.05)
Globulin: 1.6 g/dL (calc) — ABNORMAL LOW (ref 1.9–3.7)
Glucose, Bld: 82 mg/dL (ref 65–99)
Potassium: 3.4 mmol/L — ABNORMAL LOW (ref 3.5–5.3)
Sodium: 142 mmol/L (ref 135–146)
Total Bilirubin: 0.7 mg/dL (ref 0.2–1.2)
Total Protein: 6 g/dL — ABNORMAL LOW (ref 6.1–8.1)
eGFR: 77 mL/min/{1.73_m2} (ref >=60)

## 2022-01-14 LAB — LIPID PANEL
Cholesterol: 157 mg/dL (ref <200)
HDL: 88 mg/dL (ref >=50)
LDL Cholesterol (Calc): 55 mg/dL (calc)
Non-HDL Cholesterol (Calc): 69 mg/dL (calc) (ref <130)
Total CHOL/HDL Ratio: 1.8 (calc) (ref <5.0)
Triglycerides: 65 mg/dL (ref <150)

## 2022-01-14 LAB — TSH: TSH: 1.55 mIU/L (ref 0.40–4.50)

## 2022-01-17 ENCOUNTER — Encounter: Payer: Self-pay | Admitting: Internal Medicine

## 2022-01-17 ENCOUNTER — Ambulatory Visit (INDEPENDENT_AMBULATORY_CARE_PROVIDER_SITE_OTHER): Payer: Medicare Other | Admitting: Internal Medicine

## 2022-01-17 VITALS — BP 120/78 | HR 71 | Temp 97.8°F | Ht 63.0 in | Wt 129.0 lb

## 2022-01-17 DIAGNOSIS — Z92241 Personal history of systemic steroid therapy: Secondary | ICD-10-CM | POA: Diagnosis not present

## 2022-01-17 DIAGNOSIS — M8580 Other specified disorders of bone density and structure, unspecified site: Secondary | ICD-10-CM

## 2022-01-17 DIAGNOSIS — Z23 Encounter for immunization: Secondary | ICD-10-CM

## 2022-01-17 DIAGNOSIS — I10 Essential (primary) hypertension: Secondary | ICD-10-CM | POA: Diagnosis not present

## 2022-01-17 DIAGNOSIS — D860 Sarcoidosis of lung: Secondary | ICD-10-CM | POA: Diagnosis not present

## 2022-01-17 DIAGNOSIS — E559 Vitamin D deficiency, unspecified: Secondary | ICD-10-CM | POA: Diagnosis not present

## 2022-01-17 DIAGNOSIS — S22080D Wedge compression fracture of T11-T12 vertebra, subsequent encounter for fracture with routine healing: Secondary | ICD-10-CM

## 2022-01-17 DIAGNOSIS — E7800 Pure hypercholesterolemia, unspecified: Secondary | ICD-10-CM

## 2022-01-17 DIAGNOSIS — S22070D Wedge compression fracture of T9-T10 vertebra, subsequent encounter for fracture with routine healing: Secondary | ICD-10-CM

## 2022-01-17 DIAGNOSIS — Z0000 Encounter for general adult medical examination without abnormal findings: Secondary | ICD-10-CM

## 2022-01-17 DIAGNOSIS — Z8719 Personal history of other diseases of the digestive system: Secondary | ICD-10-CM

## 2022-01-17 LAB — POCT URINALYSIS DIPSTICK
Bilirubin, UA: NEGATIVE
Blood, UA: NEGATIVE
Glucose, UA: NEGATIVE
Ketones, UA: NEGATIVE
Leukocytes, UA: NEGATIVE
Nitrite, UA: NEGATIVE
Protein, UA: NEGATIVE
Spec Grav, UA: 1.01 (ref 1.010–1.025)
Urobilinogen, UA: 0.2 E.U./dL
pH, UA: 8 (ref 5.0–8.0)

## 2022-01-17 MED ORDER — POTASSIUM CHLORIDE CRYS ER 20 MEQ PO TBCR: 20.0000 meq | EXTENDED_RELEASE_TABLET | Freq: Every day | ORAL | 3 refills | Status: DC

## 2022-01-17 NOTE — Progress Notes (Unsigned)
Annual Wellness Visit     Patient: Tamara Myers, Female    DOB: Sep 04, 1955, 66 y.o.   MRN: [id number] Visit Date: 01/17/2022  Chief Complaint  Patient presents with   Medicare Wellness   Subjective    Tamara Myers is a 66 y.o. female who presents today for her Annual Wellness Visit.  HPI She also presents for health maintenance exam and evaluation of multiple medical issues.  She has a history of essential hypertension and fibrocystic breast disease.  She was diagnosed with sarcoidosis in 2020.  Currently being followed by Dr. Margaretha Seeds at [facility].  Has appointment scheduled for tomorrow.  Was last seen there November 16, 2021.  Patient does have some shortness of breath on exertion and sometimes coughs.  Sarcoid was diagnosed on bronchoscopy July 2020 with granulomatous inflammation being noted.  Started on prednisone in August 2020.  Had improvement.  Was started on methotrexate and steroids November 2020.  She continues steroids until October 2021 and maintained only on methotrexate.  Methotrexate taper was attempted in the fall 2022 but symptoms recurred.  Had to continue methotrexate March 2023 due to leukopenia.  Had to be restarted on prednisone.  Started azathioprine in May 2023 and started on prednisone taper as well.  Prednisone was tapered by Dr. Loanne Drilling after visit July 19.  She has a history of hypertension, hyperlipidemia and GE reflux.  These issues are under good control on amlodipine, HCTZ, Protonix and rosuvastatin 10 mg daily.  She had bone density study in January 2023 and had osteopenia with T score -2.3.  Had colonoscopy April 2023 and 2 hyperplastic polyps were removed.  Recently has had some mild hypokalemia and I am starting her on potassium chloride 20 mEq daily.  She is also on Maxide 25 daily.  Lipid panel and TSH are normal.  Her potassium is 3.4 despite taking Maxide 25.  Her BUN and creatinine are normal.  Adding low-dose potassium  20 mEq daily and will continue with triamterene HCTZ.  History of dysplastic nevus of right leg seen by Dr. Sarajane Jews, Mohs surgeon.  Atypical nevus removed from arm as a basal cell carcinoma removed in the past as well.  Additional past medical history: Became menopausal in 2000.  She has had 2 cesarean sections in the past.  Fracture right fifth metacarpal.  Fractured left fifth finger at age 10.  History of right knee pain and is seeing orthopedist in 2011 at which time she received an injection of Xylocaine and Aristospan.  History of right breast aspiration in May 2012 which was benign.  Social history: Married with 2 adult children.  She does not smoke or consume alcohol.  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  Family history: Father with history of CABG, diabetes, hyperlipidemia, hypertension died from respiratory failure.  He had pulmonary fibrosis.  Mother with history of Mnire's disease, macular degeneration and remote history of melanoma.  Sarcoidosis was discovered by allergist when she was referred there from this office for protracted cough.  She had a CT scan during allergy evaluation which was abnormal.  At that time she had been coughing for some 12 months and had no improvement with Protonix and Zantac.  ENT has seen her as well.  She had bronchoscopy in July 2020.  She had mediastinal adenopathy on chest CT.  She had transbronchial needle aspiration biopsies.  Cultures were obtained.  No malignant cells identified.  Biopsy showed granulomatous inflammation consistent with  sarcoidosis.  Cultures were negative for AFB and fungus.  Cough improved.  Osteoporosis was diagnosed by Dr. Franco Nones in 2021 and treated with Reclast.  Takes vitamin D supplement.  She is postmenopausal and is a thin white female but prednisone therapy likely exacerbated her osteoporosis.    Review of Systems no new complaints to speak of.  No chest pain.  No abdominal issues.  No significant musculoskeletal  pain.   Objective    Vitals: BP 120/78   Pulse 71   Temp 97.8 F (36.6 C) (Tympanic)   Ht '5\' 3"'$  (1.6 m)   Wt 129 lb (58.5 kg)   SpO2 99%   BMI 22.85 kg/m   Physical Exam Skin: Warm and dry.  No cervical adenopathy.  Chest clear.  Cardiac exam: Regular rate and rhythm.  Abdomen soft nondistended without hepatosplenomegaly masses or tenderness.  No pitting edema of the lower extremity.  Affect thought and judgment are normal.  Neuro is intact without gross focal deficits.   Most recent functional status assessment:    01/17/2022   10:02 AM  In your present state of health, do you have any difficulty performing the following activities:  Hearing? 0  Vision? 0  Difficulty concentrating or making decisions? 0  Walking or climbing stairs? 0  Dressing or bathing? 0  Doing errands, shopping? 0  Preparing Food and eating ? N  Using the Toilet? N  In the past six months, have you accidently leaked urine? N  Do you have problems with loss of bowel control? N  Managing your Medications? N  Managing your Finances? N  Housekeeping or managing your Housekeeping? N   Most recent fall risk assessment:    01/17/2022   10:01 AM  Fall Risk   Falls in the past year? 0  Number falls in past yr: 0  Injury with Fall? 0  Risk for fall due to : No Fall Risks  Follow up Falls evaluation completed    Most recent depression screenings:    01/17/2022   10:02 AM 01/08/2020    3:03 PM  PHQ 2/9 Scores  PHQ - 2 Score 0 0   Most recent cognitive screening:    01/17/2022   10:03 AM  6CIT Screen  What Year? 0 points  What month? 0 points  What time? 0 points  Count back from 20 0 points  Months in reverse 0 points  Repeat phrase 0 points  Total Score 0 points       Assessment & Plan  Sarcoidosis followed by Dr. Margaretha Seeds and stable.  He has Advair inhaler and takes Imuran.  Also has albuterol inhaler.  Essential hypertension-stable  Hypokalemia-continue Maxide 25 which is  potassium sparing but also added potassium chloride 20 mEq daily.  GE reflux treated with Protonix  Osteopenia treated with Reclast  Plan: Immunizations discussed.  Recommend COVID booster and flu vaccine this Fall.  She had pneumococcal 20 vaccine September 2022.  He has had 1 Shingrix vaccine in July 2023. Return in 1 year or as needed.         Annual wellness visit done today including the all of the following: Reviewed patient's Family Medical History Reviewed and updated list of patient's medical providers Assessment of cognitive impairment was done Assessed patient's functional ability Established a written schedule for health screening [place] Completed and Reviewed  Discussed health benefits of physical activity, and encouraged her to engage in regular exercise appropriate for her  age and condition.         IElby Showers, MD, have reviewed all documentation for this visit. The documentation on 01/25/22 for the exam, diagnosis, procedures, and orders are all accurate and complete.   LaVon Barron Alvine, CMA

## 2022-01-18 ENCOUNTER — Ambulatory Visit: Payer: Medicare Other | Admitting: Pulmonary Disease

## 2022-01-18 ENCOUNTER — Encounter: Payer: Self-pay | Admitting: Pulmonary Disease

## 2022-01-18 VITALS — BP 122/62 | HR 83 | Ht 63.0 in | Wt 128.8 lb

## 2022-01-18 DIAGNOSIS — Z79899 Other long term (current) drug therapy: Secondary | ICD-10-CM | POA: Diagnosis not present

## 2022-01-18 DIAGNOSIS — D869 Sarcoidosis, unspecified: Secondary | ICD-10-CM | POA: Diagnosis not present

## 2022-01-18 MED ORDER — FLUTICASONE-SALMETEROL 230-21 MCG/ACT IN AERO: 2.0000 | INHALATION_SPRAY | Freq: Two times a day (BID) | RESPIRATORY_TRACT | 5 refills | Status: DC

## 2022-01-18 MED ORDER — ALBUTEROL SULFATE HFA 108 (90 BASE) MCG/ACT IN AERS: 2.0000 | INHALATION_SPRAY | Freq: Four times a day (QID) | RESPIRATORY_TRACT | 1 refills | Status: AC | PRN

## 2022-01-18 NOTE — Patient Instructions (Addendum)
Asthma with mild exacerbation INCREASE Advair 230 TWO puffs TWICE a day CONTINUE Albuterol TWO puffs AS NEEDED for shortness of breath, chest tightness, cough or wheezing Take prednisone 40 mg daily for five days (patient has pills at home)  Sarcoid Decrease azathioprine 25 mg daily ORDER CBC with diff in one month Consider  Acthar in the future Sarcoidosis  Acthar Gel (repository corticotropin injection)   Follow-up with me on October 16th at 11:30 AM. OK to overbook

## 2022-01-18 NOTE — Progress Notes (Signed)
Subjective:   PATIENT ID: Tamara Myers GENDER: female DOB: [birth year], MRN: [id number]   HPI Chief Complaint  Patient presents with   Follow-up    White blood cell count low   Reason for Visit: Follow-up sarcoid  [name] is a 66 year old female with sarcoidosis, asthma, osteoporosis, GERD, HLD, HTN who presents for follow-up  Synopsis: She initially was seen by Dr. Valeta Harms in 2020 with symptoms of progressive cough and CT demonstrating perilymphatic micro-nodularity and bilateral hilar adenopathy including 1.4 cm hilar lymph node and 1cm RLL nodule consistent with sarcoid. Bronchoscopy on 11/20/18 with granulomatous inflammation. Started on prednisone in August 2020 with clinical improvement and improvement of parenchymal changes and nodularity. Started on methotrexate 10 mg and steroids November 2020. Continue steroids until 01/2020 and maintained only methotrexate with stable respiratory symptoms. Attempted methotrexate taper in fall 2022 however symptoms recurred. Has been controlled on methotrexate on low dosage 5-7.5. Had to discontinue in March 2023 due to leukopenia. Had to be restarted on prednisone. Started on azathioprine May 2023 and started on prednisone taper.  10/25/21 Since our last visit she was transitioned to azathioprine. She has tolerated medication well. Denies any adverse effects as previously discussed (nausea, rash, hair lose and discoloration of urine and tears). She reports hypokalemia and bruising related to steroid use. She eats lots of bananas and spinach. No reported leg pain or cramps  11/16/21 She reports well-controlled cough and shortness of breath on azathioprine 50 mg. Still on prednisone 10 mg. Developed itchy small rash below the anterior rib cage on upper abdomen bilaterally  01/18/22 Since our last visit she reports shortness of breath with walking uphill and upstairs. Denies cough or wheezing. Some chest tightness. 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 from pulmonary fibrosis  Past Medical History:  Diagnosis Date   Cancer ([place])    basal cell carcinoma   Cataract    bilateral   Complication of anesthesia    slow to wake up   Dyspnea    on exertion   GERD (gastroesophageal reflux disease)    on meds   Hyperlipidemia    on meds   Hypertension    on meds   Melanoma ([place]) 2018   left upper arm   Moderate persistent asthma without complication 52/84/1324   on meds   Osteoporosis    confirmed by bone scan   PONV (postoperative nausea and vomiting)    Post menopausal syndrome    Pulmonary sarcoidosis ([place])    tx'd     Family History  Problem Relation Age of Onset   Osteoporosis Mother    Diabetes Father    Heart disease Father    Hyperlipidemia Father    Hypertension Father    Pulmonary fibrosis Father    Asthma Daughter    Breast cancer Neg Hx    Allergic rhinitis Neg Hx    Eczema Neg Hx    Urticaria Neg Hx    Angioedema Neg Hx    Colon polyps Neg Hx    Colon cancer Neg Hx    Esophageal cancer Neg Hx    Rectal cancer Neg Hx    Stomach cancer Neg Hx      Social History   Occupational History   Not on file  Tobacco Use   Smoking status: Never   Smokeless tobacco: Never  Vaping Use   Vaping Use: Never used  Substance and Sexual Activity  Alcohol use: No   Drug use: No   Sexual activity: Not on file    No Known Allergies   Outpatient Medications Prior to Visit  Medication Sig Dispense Refill   ADVAIR HFA 115-21 MCG/ACT inhaler INHALE TWO PUFFS INTO THE LUNGS TWICE A DAY 12 g 12   albuterol (PROVENTIL HFA;VENTOLIN HFA) 108 (90 Base) MCG/ACT inhaler Inhale 2 puffs into the lungs every 6 (six) hours as needed for wheezing or shortness of breath. 18 g 1   amLODipine (NORVASC) 5 MG tablet Take 1 tablet (5 mg total) by mouth daily. 90 tablet 1   azaTHIOprine (IMURAN) 50 MG tablet Take 1 tablet (50 mg total) by mouth  daily. 30 tablet 2   pantoprazole (PROTONIX) 40 MG tablet Take 1 tablet (40 mg total) by mouth daily. 90 tablet 3   potassium chloride SA (KLOR-CON M) 20 MEQ tablet Take 1 tablet (20 mEq total) by mouth daily. 90 tablet 3   rosuvastatin (CRESTOR) 10 MG tablet TAKE ONE TABLET BY MOUTH DAILY AT 6PM 90 tablet 3   triamterene-hydrochlorothiazide (MAXZIDE-25) 37.5-25 MG tablet TAKE ONE TABLET BY MOUTH DAILY 90 tablet 1   Zoledronic Acid (RECLAST IV) Inject into the vein.     nystatin (MYCOSTATIN) 100000 UNIT/ML suspension Take by mouth.     No facility-administered medications prior to visit.    Review of Systems  Constitutional:  Negative for chills, diaphoresis, fever, malaise/fatigue and weight loss.  HENT:  Negative for congestion.   Respiratory:  Positive for cough and shortness of breath. Negative for hemoptysis, sputum production and wheezing.   Cardiovascular:  Negative for chest pain, palpitations and leg swelling.       Chest tightness     Objective:   Vitals:   01/18/22 0848  BP: 122/62  Pulse: 83  SpO2: 99%  Weight: 128 lb 12.8 oz (58.4 kg)  Height: 5' 3"  (1.6 m)   SpO2: 99 % O2 Device: None (Room air)  Physical Exam: General: Well-appearing, no acute distress HENT: [place], AT Eyes: EOMI, no scleral icterus Respiratory: Clear to auscultation bilaterally.  No crackles, wheezing or rales Cardiovascular: RRR, -M/R/G, no JVD Extremities:-Edema,-tenderness Neuro: AAO x4, CNII-XII grossly intact Psych: Normal mood, normal affect Skin: Mild maculopapular rash scattered in 7 cm region on upper mid abdomen (seen on last visit)   Data Reviewed:  Imaging:  CT Chest 07/02/18 - Bilateral nodularity with perilymphatic distribution including 1cm RLL nodule. Right hilar enlargement.  CT Chest 10/18/18 - Progressive bilateral nodularity including RLL nodule ~38m  CT Chest 03/10/19 - Improved parenchymal changes with mild disease in RML, lingular and superior LLL  CT Chest HR  06/30/19 - Improved parenchymal changes in setting of immunosuppression. RLL 5 mm subpleural nodule.  CT Chest HR 07/15/20 - No significant parenchymal abnormalities or consolidations seen. Stable calcified mediastinal and bilateral hilar nodes. New 2357msolid RUL nodule likely benign. Stable peripheral RUL GGO stable and RLL 57m48module stable since 06/2019.  PFT: 01/23/18 FVC 2.53 (77%) FEV1 2.19 (86%) Ratio 87% Interpretation: No obstructive lung disease. Reduced FVC and FEV1  04/04/18 FVC 2.59 (78%) FEV1 1.93 (76%) Ratio 75% Interpretation: No obstructive lung disease. Reduced FVC and FEV1.  05/09/18 FVC 2.43 (73%) FEV1 1.6 (63%) Ratio 66% Interpretation: Moderate obstructive lung disease  11/06/18 FVC 2.92 (89%) FEV1 2.04 (74%) Ratio 74  (Pred 78) TLC 93% DLCO 100%. F-V loops suggestive of small airway disease Interpretation: Mild obstructive defect. No significant BD  03/17/20 FVC 2.53 (79%) FEV1 1.96 (  80%) Ratio 65  TLC 87% DLCO 101%. Significant BD response in FEV1 Interpretation: Mild obstructive defect with significant bronchodilator response     Latest Ref Rng & Units 01/13/2022    9:06 AM 11/16/2021    9:59 AM 10/14/2021    9:28 AM  CBC  WBC 3.8 - 10.8 Thousand/uL 2.3  4.5  4.1   Hemoglobin 11.7 - 15.5 g/dL 13.1  13.8  13.5   Hematocrit 35.0 - 45.0 % 37.5  41.1  39.9   Platelets 140 - 400 Thousand/uL 212  247.0  200.0   Improved WBC after discontinuation of methotrexate on 07/27/21      Latest Ref Rng & Units 01/13/2022    9:06 AM 11/16/2021    9:59 AM 10/14/2021    9:28 AM  BMP  Glucose 65 - 99 mg/dL 82  96  94   BUN 7 - 25 mg/dL 14  16  13    Creatinine 0.50 - 1.05 mg/dL 0.84  0.94  1.07   BUN/Creat Ratio 6 - 22 (calc) SEE NOTE:     Sodium 135 - 146 mmol/L 142  141  143   Potassium 3.5 - 5.3 mmol/L 3.4  3.3  3.0   Chloride 98 - 110 mmol/L 104  101  102   CO2 20 - 32 mmol/L 30  31  32   Calcium 8.6 - 10.4 mg/dL 9.4  9.6  9.7         Latest Ref Rng & Units 01/13/2022     9:06 AM 11/16/2021    9:59 AM 10/14/2021    9:28 AM  Hepatic Function  Total Protein 6.1 - 8.1 g/dL 6.0  6.7  6.1   Albumin 3.5 - 5.2 g/dL  4.7  4.1   AST 10 - 35 U/L 26  34  29   ALT 6 - 29 U/L 14  34  24   Alk Phosphatase 39 - 117 U/L  56  42   Total Bilirubin 0.2 - 1.2 mg/dL 0.7  0.6  0.8    Assessment & Plan:   Discussion: 66 year old female with sarcoidosis, asthma, osteoporosis, GERD, HLD, HTN who presents for follow-up for acute sarcoid flare.  We discussed the clinical course of sarcoid and management including serial PFTs, labs, eye exam, and EKG and chest imaging if indicated. Discussed immunosuppressant plan as noted below. She has had relapse symptoms off methotrexate. Currently on azathioprine and weaned off prednisone however now with leukopenia. Also presents with symptoms of mild exacerbation.  Pulmonary sarcoidosis --Dx in 11/20/18 via bronchoscopy  History of immunosuppression High risk medication management --QuantiFERON-TB 03/12/19: neg  --Prednisone 11/2018>01/2020 --Methotrexate 03/2019>06/2021. D/C'd due to leukopenia --Prednisone 06/2021>08/2021 --Azathioprine 09/09/21. Titrated to 25 mg daily due to leukopenia --Decrease azathioprine 50 mg daily --Consider  Acthar in the future Sarcoidosis  Acthar Gel (repository corticotropin injection)  --Routine labs q3 months  Sarcoid Monitoring --Recent chest imaging reviewed --Annual PFTs.  Last PFTs 03/17/20 --Annual ophthalmology exam.  Last visit on 06/06/21. No active disease --MR Cardiac 11/28/19. No active disease --Monitor routine labs as needed: CBC with diff, CMET, 1, 25 and 25 hydroxy vitamin D, urinary calcium  Hypokalemia - mild, improving off steroids --Patient reports usually related to prednisone use.  --Discussed potassium rich diet  Asthma with mild exacerbation INCREASE Advair 230 TWO puffs TWICE a day CONTINUE Albuterol TWO puffs AS NEEDED for shortness of breath, chest tightness, cough or  wheezing Take prednisone 40 mg daily for five days (  patient has pills at home)   Health Maintenance Immunization History  Administered Date(s) Administered   Influenza Inj Mdck Quad Pf 02/13/2014, 01/21/2021   Influenza Inj Mdck Quad With Preservative 02/13/2014   Influenza Split 02/13/2013   Influenza, Seasonal, Injecte, Preservative Fre 02/13/2013   Influenza,inj,Quad PF,6+ Mos 12/21/2016, 12/25/2017, 01/02/2019, 01/08/2020, 01/17/2022   Influenza,trivalent, recombinat, inj, PF 02/09/2011, 02/15/2012   PFIZER(Purple Top)SARS-COV-2 Vaccination 07/03/2019, 08/03/2019, 02/26/2020, 01/21/2021   PNEUMOCOCCAL CONJUGATE-20 01/10/2021   Tdap 03/16/2009, 01/15/2020   Zoster Recombinat (Shingrix) 11/22/2021    Orders Placed This Encounter  Procedures   CBC w/Diff    Standing Status:   Future    Standing Expiration Date:   01/18/2023   Meds ordered this encounter  Medications   albuterol (VENTOLIN HFA) 108 (90 Base) MCG/ACT inhaler    Sig: Inhale 2 puffs into the lungs every 6 (six) hours as needed for wheezing or shortness of breath.    Dispense:  18 g    Refill:  1   fluticasone-salmeterol (ADVAIR HFA) 230-21 MCG/ACT inhaler    Sig: Inhale 2 puffs into the lungs 2 (two) times daily.    Dispense:  1 each    Refill:  5    Return in about 26 days (around 02/13/2022).   I have spent a total time of 38-minutes on the day of the appointment including chart review, data review, collecting history, coordinating care and discussing medical diagnosis and plan with the patient/family. Past medical history, allergies, medications were reviewed. Pertinent imaging, labs and tests included in this note have been reviewed and interpreted independently by me.  Hutch[place], MD [facility] 01/18/2022 9:00 AM  Office Number [phone]

## 2022-01-19 ENCOUNTER — Other Ambulatory Visit: Payer: Self-pay | Admitting: Pulmonary Disease

## 2022-01-21 ENCOUNTER — Encounter: Payer: Self-pay | Admitting: Pulmonary Disease

## 2022-01-25 ENCOUNTER — Other Ambulatory Visit: Payer: Self-pay | Admitting: Internal Medicine

## 2022-01-25 NOTE — Patient Instructions (Addendum)
Due to hypokalemia, I added potassium supplement.

## 2022-02-10 ENCOUNTER — Other Ambulatory Visit (INDEPENDENT_AMBULATORY_CARE_PROVIDER_SITE_OTHER): Payer: Medicare Other

## 2022-02-10 DIAGNOSIS — D869 Sarcoidosis, unspecified: Secondary | ICD-10-CM | POA: Diagnosis not present

## 2022-02-10 LAB — CBC WITH DIFFERENTIAL/PLATELET
Basophils Absolute: 0 10*3/uL (ref 0.0–0.1)
Basophils Relative: 0.5 % (ref 0.0–3.0)
Eosinophils Absolute: 0.2 10*3/uL (ref 0.0–0.7)
Eosinophils Relative: 6.1 % — ABNORMAL HIGH (ref 0.0–5.0)
HCT: 39.4 % (ref 36.0–46.0)
Hemoglobin: 13.3 g/dL (ref 12.0–15.0)
Lymphocytes Relative: 21.9 % (ref 12.0–46.0)
Lymphs Abs: 0.6 10*3/uL — ABNORMAL LOW (ref 0.7–4.0)
MCHC: 33.6 g/dL (ref 30.0–36.0)
MCV: 90.1 fl (ref 78.0–100.0)
Monocytes Absolute: 0.4 10*3/uL (ref 0.1–1.0)
Monocytes Relative: 15.4 % — ABNORMAL HIGH (ref 3.0–12.0)
Neutro Abs: 1.6 10*3/uL (ref 1.4–7.7)
Neutrophils Relative %: 56.1 % (ref 43.0–77.0)
Platelets: 199 10*3/uL (ref 150.0–400.0)
RBC: 4.38 Mil/uL (ref 3.87–5.11)
RDW: 12.3 % (ref 11.5–15.5)
WBC: 2.8 10*3/uL — ABNORMAL LOW (ref 4.0–10.5)

## 2022-02-13 ENCOUNTER — Ambulatory Visit: Payer: Medicare Other | Admitting: Pulmonary Disease

## 2022-02-13 ENCOUNTER — Encounter: Payer: Self-pay | Admitting: Pulmonary Disease

## 2022-02-13 VITALS — BP 112/68 | HR 83 | Ht 63.0 in | Wt 130.2 lb

## 2022-02-13 DIAGNOSIS — D869 Sarcoidosis, unspecified: Secondary | ICD-10-CM

## 2022-02-13 DIAGNOSIS — Z79899 Other long term (current) drug therapy: Secondary | ICD-10-CM | POA: Diagnosis not present

## 2022-02-13 NOTE — Patient Instructions (Addendum)
Sarcoidosis --Continue azathioprine 25 mg daily --Consider  Acthar in the future if symptoms return --ORDER CBC with diff and CMET   Follow-up in 2 months with me

## 2022-02-13 NOTE — Progress Notes (Unsigned)
Subjective:   PATIENT ID: Tamara Myers GENDER: female DOB: 1956-04-17, MRN: [id number]   HPI Chief Complaint  Patient presents with   Follow-up    Feeling good   Reason for Visit: Follow-up sarcoid  [name] is a 66 year old female with sarcoidosis, asthma, osteoporosis, GERD, HLD, HTN who presents for follow-up  Synopsis: She initially was seen by Dr. Valeta Myers in 2020 with symptoms of progressive cough and CT demonstrating perilymphatic micro-nodularity and bilateral hilar adenopathy including 1.4 cm hilar lymph node and 1cm RLL nodule consistent with sarcoid. Bronchoscopy on 11/20/18 with granulomatous inflammation. Started on prednisone in August 2020 with clinical improvement and improvement of parenchymal changes and nodularity. Started on methotrexate 10 mg and steroids November 2020. Continue steroids until 01/2020 and maintained only methotrexate with stable respiratory symptoms. Attempted methotrexate taper in fall 2022 however symptoms recurred. Has been controlled on methotrexate on low dosage 5-7.5. Had to discontinue in March 2023 due to leukopenia. Had to be restarted on prednisone. Started on azathioprine May 2023 and currently on reduced dosing due to leukopenia.  02/13/22 Since our last visit shortness of breath has remained stable on azathioprine 25 mg. Off steroids. Denies adverse effects (nausea, rash, hair loss, discoloration of urine and tears). No shortness of breath, cough or wheezing.  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  Past Medical History:  Diagnosis Date   Cancer ([place])    basal cell carcinoma   Cataract    bilateral   Complication of anesthesia    slow to wake up   Dyspnea    on exertion   GERD (gastroesophageal reflux disease)    on meds   Hyperlipidemia    on meds   Hypertension    on meds   Melanoma ([place]) 2018   left upper arm   Moderate persistent asthma without  complication 66/09/43   on meds   Osteoporosis    confirmed by bone scan   PONV (postoperative nausea and vomiting)    Post menopausal syndrome    Pulmonary sarcoidosis ([place])    tx'd     Family History  Problem Relation Age of Onset   Osteoporosis Mother    Diabetes Father    Heart disease Father    Hyperlipidemia Father    Hypertension Father    Pulmonary fibrosis Father    Asthma Daughter    Breast cancer Neg Hx    Allergic rhinitis Neg Hx    Eczema Neg Hx    Urticaria Neg Hx    Angioedema Neg Hx    Colon polyps Neg Hx    Colon cancer Neg Hx    Esophageal cancer Neg Hx    Rectal cancer Neg Hx    Stomach cancer Neg Hx      Social History   Occupational History   Not on file  Tobacco Use   Smoking status: Never   Smokeless tobacco: Never  Vaping Use   Vaping Use: Never used  Substance and Sexual Activity   Alcohol use: No   Drug use: No   Sexual activity: Not on file    No Known Allergies   Outpatient Medications Prior to Visit  Medication Sig Dispense Refill   albuterol (VENTOLIN HFA) 108 (90 Base) MCG/ACT inhaler Inhale 2 puffs into the lungs every 6 (six) hours as needed for wheezing or shortness of breath. 18 g 1   amLODipine (NORVASC) 5 MG tablet TAKE ONE TABLET BY  MOUTH DAILY 90 tablet 1   azaTHIOprine (IMURAN) 50 MG tablet TAKE 1 TABLET BY MOUTH DAILY 30 tablet 2   fluticasone-salmeterol (ADVAIR HFA) 230-21 MCG/ACT inhaler Inhale 2 puffs into the lungs 2 (two) times daily. 1 each 5   pantoprazole (PROTONIX) 40 MG tablet Take 1 tablet (40 mg total) by mouth daily. 90 tablet 3   potassium chloride SA (KLOR-CON M) 20 MEQ tablet Take 1 tablet (20 mEq total) by mouth daily. 90 tablet 3   rosuvastatin (CRESTOR) 10 MG tablet TAKE ONE TABLET BY MOUTH DAILY AT 6PM 90 tablet 3   triamterene-hydrochlorothiazide (MAXZIDE-25) 37.5-25 MG tablet TAKE ONE TABLET BY MOUTH DAILY 90 tablet 1   Zoledronic Acid (RECLAST IV) Inject into the vein.     No  facility-administered medications prior to visit.    Review of Systems  Constitutional:  Negative for chills, diaphoresis, fever, malaise/fatigue and weight loss.  HENT:  Negative for congestion.   Respiratory:  Negative for cough, hemoptysis, sputum production, shortness of breath and wheezing.   Cardiovascular:  Negative for chest pain, palpitations and leg swelling.     Objective:   Vitals:   02/13/22 1133  BP: 112/68  Pulse: 83  SpO2: 98%  Weight: 130 lb 3.2 oz (59.1 kg)  Height: 5' 3"  (1.6 m)   SpO2: 98 % O2 Device: None (Room air)  Physical Exam: General: Well-appearing, no acute distress HENT: [place], AT Eyes: EOMI, no scleral icterus Respiratory: Clear to auscultation bilaterally.  No crackles, wheezing or rales Cardiovascular: RRR, -M/R/G, no JVD Extremities:-Edema,-tenderness Neuro: AAO x4, CNII-XII grossly intact Psych: Normal mood, normal affect Skin: Mild maculopapular rash scattered in 7 cm region on upper mid abdomen (seen on prior visit)  Data Reviewed:  Imaging:  CT Chest 07/02/18 - Bilateral nodularity with perilymphatic distribution including 1cm RLL nodule. Right hilar enlargement.  CT Chest 10/18/18 - Progressive bilateral nodularity including RLL nodule ~53m  CT Chest 03/10/19 - Improved parenchymal changes with mild disease in RML, lingular and superior LLL  CT Chest HR 06/30/19 - Improved parenchymal changes in setting of immunosuppression. RLL 5 mm subpleural nodule.  CT Chest HR 07/15/20 - No significant parenchymal abnormalities or consolidations seen. Stable calcified mediastinal and bilateral hilar nodes. New 226msolid RUL nodule likely benign. Stable peripheral RUL GGO stable and RLL 41m73module stable since 06/2019.  PFT: 01/23/18 FVC 2.53 (77%) FEV1 2.19 (86%) Ratio 87% Interpretation: No obstructive lung disease. Reduced FVC and FEV1  04/04/18 FVC 2.59 (78%) FEV1 1.93 (76%) Ratio 75% Interpretation: No obstructive lung disease. Reduced FVC and  FEV1.  05/09/18 FVC 2.43 (73%) FEV1 1.6 (63%) Ratio 66% Interpretation: Moderate obstructive lung disease  11/06/18 FVC 2.92 (89%) FEV1 2.04 (74%) Ratio 74  (Pred 78) TLC 93% DLCO 100%. F-V loops suggestive of small airway disease Interpretation: Mild obstructive defect. No significant BD  03/17/20 FVC 2.53 (79%) FEV1 1.96 (80%) Ratio 65  TLC 87% DLCO 101%. Significant BD response in FEV1 Interpretation: Mild obstructive defect with significant bronchodilator response     Latest Ref Rng & Units 02/10/2022    9:21 AM 01/13/2022    9:06 AM 11/16/2021    9:59 AM  CBC  WBC 4.0 - 10.5 K/uL 2.8  2.3  4.5   Hemoglobin 12.0 - 15.0 g/dL 13.3  13.1  13.8   Hematocrit 36.0 - 46.0 % 39.4  37.5  41.1   Platelets 150.0 - 400.0 K/uL 199.0  212  247.0   Improved WBC after discontinuation of  methotrexate on 07/27/21      Latest Ref Rng & Units 01/13/2022    9:06 AM 11/16/2021    9:59 AM 10/14/2021    9:28 AM  BMP  Glucose 65 - 99 mg/dL 82  96  94   BUN 7 - 25 mg/dL 14  16  13    Creatinine 0.50 - 1.05 mg/dL 0.84  0.94  1.07   BUN/Creat Ratio 6 - 22 (calc) SEE NOTE:     Sodium 135 - 146 mmol/L 142  141  143   Potassium 3.5 - 5.3 mmol/L 3.4  3.3  3.0   Chloride 98 - 110 mmol/L 104  101  102   CO2 20 - 32 mmol/L 30  31  32   Calcium 8.6 - 10.4 mg/dL 9.4  9.6  9.7         Latest Ref Rng & Units 01/13/2022    9:06 AM 11/16/2021    9:59 AM 10/14/2021    9:28 AM  Hepatic Function  Total Protein 6.1 - 8.1 g/dL 6.0  6.7  6.1   Albumin 3.5 - 5.2 g/dL  4.7  4.1   AST 10 - 35 U/L 26  34  29   ALT 6 - 29 U/L 14  34  24   Alk Phosphatase 39 - 117 U/L  56  42   Total Bilirubin 0.2 - 1.2 mg/dL 0.7  0.6  0.8    Assessment & Plan:   Discussion: 66 year old female with sarcoidosis, asthma, osteoporosis, GERD, HLD, HTN who presents for follow-up for sarcoid flare.  We discussed the clinical course of sarcoid and management including serial PFTs, labs, eye exam, and EKG and chest imaging if indicated.  Discussed immunosuppressant plan as noted below. She has had relapse symptoms off methotrexate but unable to tolerate due to leukopenia. Has had leukopenia with azathioprine and dosing has been reduced. Fortunately well-controlled on low dose.  Pulmonary sarcoidosis --Dx in 11/20/18 via bronchoscopy  History of immunosuppression High risk medication management --QuantiFERON-TB 03/12/19: neg  --Prednisone 11/2018>01/2020 --Methotrexate 03/2019>06/2021. D/C'd due to leukopenia --Prednisone 06/2021>08/2021 --Azathioprine 09/09/21. Titrated down to 25 mg daily due to leukopenia --Continue azathioprine 25 mg daily --Consider Acthar in the future if symptoms return Sarcoidosis  Acthar Gel (repository corticotropin injection)  --Routine labs q3 months  Sarcoid Monitoring --Recent chest imaging reviewed --Annual PFTs.  Last PFTs 03/17/20 --Annual ophthalmology exam.  Last visit on 06/06/21. No active disease --MR Cardiac 11/28/19. No active disease --Monitor routine labs as needed: CBC with diff, CMET, 1, 25 and 25 hydroxy vitamin D, urinary calcium  Hypokalemia - mild, improving off steroids --Patient reports usually related to prednisone use.  --Discussed potassium rich diet  Asthma  Continue Advair 230-21 TWO puffs TWICE a day CONTINUE Albuterol TWO puffs AS NEEDED for shortness of breath, chest tightness, cough or wheezing  Health Maintenance Immunization History  Administered Date(s) Administered   Influenza Inj Mdck Quad Pf 02/13/2014, 01/21/2021   Influenza Inj Mdck Quad With Preservative 02/13/2014   Influenza Split 02/13/2013   Influenza, Seasonal, Injecte, Preservative Fre 02/13/2013   Influenza,inj,Quad PF,6+ Mos 12/21/2016, 12/25/2017, 01/02/2019, 01/08/2020, 01/17/2022   Influenza,trivalent, recombinat, inj, PF 02/09/2011, 02/15/2012   PFIZER(Purple Top)SARS-COV-2 Vaccination 07/03/2019, 08/03/2019, 02/26/2020, 01/21/2021   PNEUMOCOCCAL CONJUGATE-20 01/10/2021   Respiratory  Syncytial Virus Vaccine,Recomb Aduvanted(Arexvy) 01/26/2022   Tdap 03/16/2009, 01/15/2020   Zoster Recombinat (Shingrix) 11/22/2021, 02/08/2022    Orders Placed This Encounter  Procedures   CBC w/Diff  Standing Status:   Future    Standing Expiration Date:   04/15/2022   Comp Met (CMET)    Standing Status:   Future    Standing Expiration Date:   04/15/2022   No orders of the defined types were placed in this encounter.   Return in about 2 months (around 04/15/2022).   I have spent a total time of 20-minutes on the day of the appointment including chart review, data review, collecting history, coordinating care and discussing medical diagnosis and plan with the patient/family. Past medical history, allergies, medications were reviewed. Pertinent imaging, labs and tests included in this note have been reviewed and interpreted independently by me.  [place], MD [facility] 02/13/2022 11:54 AM  Office Number [phone]

## 2022-02-14 DIAGNOSIS — L438 Other lichen planus: Secondary | ICD-10-CM | POA: Diagnosis not present

## 2022-02-14 DIAGNOSIS — D225 Melanocytic nevi of trunk: Secondary | ICD-10-CM | POA: Diagnosis not present

## 2022-02-14 DIAGNOSIS — D2272 Melanocytic nevi of left lower limb, including hip: Secondary | ICD-10-CM | POA: Diagnosis not present

## 2022-02-14 DIAGNOSIS — D0439 Carcinoma in situ of skin of other parts of face: Secondary | ICD-10-CM | POA: Diagnosis not present

## 2022-02-14 DIAGNOSIS — D2271 Melanocytic nevi of right lower limb, including hip: Secondary | ICD-10-CM | POA: Diagnosis not present

## 2022-02-14 DIAGNOSIS — C44519 Basal cell carcinoma of skin of other part of trunk: Secondary | ICD-10-CM | POA: Diagnosis not present

## 2022-02-14 DIAGNOSIS — C44612 Basal cell carcinoma of skin of right upper limb, including shoulder: Secondary | ICD-10-CM | POA: Diagnosis not present

## 2022-02-18 ENCOUNTER — Encounter: Payer: Self-pay | Admitting: Pulmonary Disease

## 2022-03-11 ENCOUNTER — Other Ambulatory Visit: Payer: Self-pay | Admitting: Pulmonary Disease

## 2022-03-13 ENCOUNTER — Other Ambulatory Visit: Payer: Self-pay | Admitting: Internal Medicine

## 2022-03-13 DIAGNOSIS — Z1231 Encounter for screening mammogram for malignant neoplasm of breast: Secondary | ICD-10-CM

## 2022-03-17 ENCOUNTER — Ambulatory Visit
Admission: RE | Admit: 2022-03-17 | Discharge: 2022-03-17 | Disposition: A | Discharge status: Home / Self Care | Payer: Medicare Other | Source: Ambulatory Visit | Attending: Internal Medicine | Admitting: Internal Medicine

## 2022-03-17 DIAGNOSIS — Z1231 Encounter for screening mammogram for malignant neoplasm of breast: Secondary | ICD-10-CM | POA: Diagnosis not present

## 2022-04-06 DIAGNOSIS — D0439 Carcinoma in situ of skin of other parts of face: Secondary | ICD-10-CM | POA: Diagnosis not present

## 2022-04-06 DIAGNOSIS — C44519 Basal cell carcinoma of skin of other part of trunk: Secondary | ICD-10-CM | POA: Diagnosis not present

## 2022-04-10 ENCOUNTER — Other Ambulatory Visit (INDEPENDENT_AMBULATORY_CARE_PROVIDER_SITE_OTHER): Payer: Medicare Other

## 2022-04-10 DIAGNOSIS — D869 Sarcoidosis, unspecified: Secondary | ICD-10-CM | POA: Diagnosis not present

## 2022-04-10 LAB — COMPREHENSIVE METABOLIC PANEL
ALT: 14 U/L (ref 0–35)
AST: 21 U/L (ref 0–37)
Albumin: 4.3 g/dL (ref 3.5–5.2)
Alkaline Phosphatase: 42 U/L (ref 39–117)
BUN: 14 mg/dL (ref 6–23)
CO2: 28 mEq/L (ref 19–32)
Calcium: 9.8 mg/dL (ref 8.4–10.5)
Chloride: 104 mEq/L (ref 96–112)
Creatinine, Ser: 0.88 mg/dL (ref 0.40–1.20)
GFR: 68.69 mL/min (ref >60.00)
Glucose, Bld: 96 mg/dL (ref 70–99)
Potassium: 3.7 mEq/L (ref 3.5–5.1)
Sodium: 141 mEq/L (ref 135–145)
Total Bilirubin: 0.6 mg/dL (ref 0.2–1.2)
Total Protein: 6.5 g/dL (ref 6.0–8.3)

## 2022-04-10 LAB — CBC WITH DIFFERENTIAL/PLATELET
Basophils Absolute: 0 10*3/uL (ref 0.0–0.1)
Basophils Relative: 0.6 % (ref 0.0–3.0)
Eosinophils Absolute: 0.1 10*3/uL (ref 0.0–0.7)
Eosinophils Relative: 1.6 % (ref 0.0–5.0)
HCT: 39.3 % (ref 36.0–46.0)
Hemoglobin: 13.4 g/dL (ref 12.0–15.0)
Lymphocytes Relative: 17.6 % (ref 12.0–46.0)
Lymphs Abs: 0.7 10*3/uL (ref 0.7–4.0)
MCHC: 34.1 g/dL (ref 30.0–36.0)
MCV: 87.9 fl (ref 78.0–100.0)
Monocytes Absolute: 0.5 10*3/uL (ref 0.1–1.0)
Monocytes Relative: 12.7 % — ABNORMAL HIGH (ref 3.0–12.0)
Neutro Abs: 2.7 10*3/uL (ref 1.4–7.7)
Neutrophils Relative %: 67.5 % (ref 43.0–77.0)
Platelets: 311 10*3/uL (ref 150.0–400.0)
RBC: 4.48 Mil/uL (ref 3.87–5.11)
RDW: 13 % (ref 11.5–15.5)
WBC: 4 10*3/uL (ref 4.0–10.5)

## 2022-04-12 ENCOUNTER — Ambulatory Visit (HOSPITAL_BASED_OUTPATIENT_CLINIC_OR_DEPARTMENT_OTHER): Payer: Medicare Other | Admitting: Pulmonary Disease

## 2022-04-12 ENCOUNTER — Encounter (HOSPITAL_BASED_OUTPATIENT_CLINIC_OR_DEPARTMENT_OTHER): Payer: Self-pay | Admitting: Pulmonary Disease

## 2022-04-12 VITALS — BP 118/78 | HR 80 | Ht 63.0 in | Wt 126.0 lb

## 2022-04-12 DIAGNOSIS — D869 Sarcoidosis, unspecified: Secondary | ICD-10-CM | POA: Diagnosis not present

## 2022-04-12 MED ORDER — FLUTICASONE-SALMETEROL 230-21 MCG/ACT IN AERO: 2.0000 | INHALATION_SPRAY | Freq: Two times a day (BID) | RESPIRATORY_TRACT | 5 refills | Status: DC

## 2022-04-12 MED ORDER — AZATHIOPRINE 50 MG PO TABS: 25.0000 mg | ORAL_TABLET | Freq: Every day | ORAL | 2 refills | Status: DC

## 2022-04-12 NOTE — Progress Notes (Signed)
Subjective:   PATIENT ID: Tamara Myers GENDER: female DOB: [birth year], MRN: [id number]   HPI  Chief Complaint  Patient presents with   Follow-up    Cough for past 3 wks, sometimes productive    Reason for Visit: Follow-up sarcoid  Ms. Ezelle Mangel is a 66 year old female with sarcoidosis, asthma, osteoporosis, GERD, HLD, HTN who presents for follow-up  Synopsis: She initially was seen by Dr. Valeta Harms in 2020 with symptoms of progressive cough and CT demonstrating perilymphatic micro-nodularity and bilateral hilar adenopathy including 1.4 cm hilar lymph node and 1cm RLL nodule consistent with sarcoid. Bronchoscopy on 11/20/18 with granulomatous inflammation. Started on prednisone in August 2020 with clinical improvement and improvement of parenchymal changes and nodularity. Started on methotrexate 10 mg and steroids November 2020. Continue steroids until 01/2020 and maintained only methotrexate with stable respiratory symptoms. Attempted methotrexate taper in fall 2022 however symptoms recurred. Has been controlled on methotrexate on low dosage 5-7.5. Had to discontinue in March 2023 due to leukopenia. Had to be restarted on prednisone. Started on azathioprine May 2023 and currently on reduced dosing due to leukopenia.  04/12/22 Since our last visit she reports improved symptoms however did develop a cough in the last three weeks. This may have been preceded by cold but not sure if it is related. Using albuterol once a week and advair as directed. Cough is sometimes productive. No wheezing. Compliant with azathioprine. Denies side effects (nausea, rash, hair loss, discoloration of urine or tears).   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  Past Medical History:  Diagnosis Date   Cancer ([place])    basal cell carcinoma   Cataract    bilateral   Complication of anesthesia    slow to wake up   Dyspnea    on exertion   GERD  (gastroesophageal reflux disease)    on meds   Hyperlipidemia    on meds   Hypertension    on meds   Melanoma ([place]) 2018   left upper arm   Moderate persistent asthma without complication 92/42/6834   on meds   Osteoporosis    confirmed by bone scan   PONV (postoperative nausea and vomiting)    Post menopausal syndrome    Pulmonary sarcoidosis ([place])    tx'd     Family History  Problem Relation Age of Onset   Osteoporosis Mother    Diabetes Father    Heart disease Father    Hyperlipidemia Father    Hypertension Father    Pulmonary fibrosis Father    Asthma Daughter    Breast cancer Neg Hx    Allergic rhinitis Neg Hx    Eczema Neg Hx    Urticaria Neg Hx    Angioedema Neg Hx    Colon polyps Neg Hx    Colon cancer Neg Hx    Esophageal cancer Neg Hx    Rectal cancer Neg Hx    Stomach cancer Neg Hx      Social History   Occupational History   Not on file  Tobacco Use   Smoking status: Never   Smokeless tobacco: Never  Vaping Use   Vaping Use: Never used  Substance and Sexual Activity   Alcohol use: No   Drug use: No   Sexual activity: Not on file    No Known Allergies   Outpatient Medications Prior to Visit  Medication Sig Dispense Refill   albuterol (VENTOLIN HFA) 108 (90  Base) MCG/ACT inhaler Inhale 2 puffs into the lungs every 6 (six) hours as needed for wheezing or shortness of breath. 18 g 1   amLODipine (NORVASC) 5 MG tablet TAKE ONE TABLET BY MOUTH DAILY 90 tablet 1   pantoprazole (PROTONIX) 40 MG tablet TAKE 1 TABLET BY MOUTH DAILY 90 tablet 3   potassium chloride SA (KLOR-CON M) 20 MEQ tablet Take 1 tablet (20 mEq total) by mouth daily. 90 tablet 3   rosuvastatin (CRESTOR) 10 MG tablet TAKE ONE TABLET BY MOUTH DAILY AT 6PM 90 tablet 3   triamterene-hydrochlorothiazide (MAXZIDE-25) 37.5-25 MG tablet TAKE ONE TABLET BY MOUTH DAILY 90 tablet 1   azaTHIOprine (IMURAN) 50 MG tablet TAKE 1 TABLET BY MOUTH DAILY 30 tablet 2   fluticasone-salmeterol (ADVAIR  HFA) 230-21 MCG/ACT inhaler Inhale 2 puffs into the lungs 2 (two) times daily. 1 each 5   Zoledronic Acid (RECLAST IV) Inject into the vein. (Patient not taking: Reported on 04/12/2022)     No facility-administered medications prior to visit.    Review of Systems  Constitutional:  Negative for chills, diaphoresis, fever, malaise/fatigue and weight loss.  HENT:  Negative for congestion.   Respiratory:  Negative for cough, hemoptysis, sputum production, shortness of breath and wheezing.   Cardiovascular:  Negative for chest pain, palpitations and leg swelling.     Objective:   Vitals:   04/12/22 0823  BP: 118/78  Pulse: 80  SpO2: 99%  Weight: 126 lb (57.2 kg)  Height: _0  (1.6 m)   SpO2: 99 % O2 Device: None (Room air)  Physical Exam: General: Well-appearing, no acute distress HENT: [place], AT Eyes: EOMI, no scleral icterus Respiratory: Clear to auscultation bilaterally.  No crackles, wheezing or rales Cardiovascular: RRR, -M/R/G, no JVD Extremities:-Edema,-tenderness Neuro: AAO x4, CNII-XII grossly intact Psych: Normal mood, normal affect  Data Reviewed:  Imaging:  CT Chest 07/02/18 - Bilateral nodularity with perilymphatic distribution including 1cm RLL nodule. Right hilar enlargement.  CT Chest 10/18/18 - Progressive bilateral nodularity including RLL nodule ~352m  CT Chest 03/10/19 - Improved parenchymal changes with mild disease in RML, lingular and superior LLL  CT Chest HR 06/30/19 - Improved parenchymal changes in setting of immunosuppression. RLL 5 mm subpleural nodule.  CT Chest HR 07/15/20 - No significant parenchymal abnormalities or consolidations seen. Stable calcified mediastinal and bilateral hilar nodes. New 261msolid RUL nodule likely benign. Stable peripheral RUL GGO stable and RLL 52m46module stable since 06/2019.  PFT: 01/23/18 FVC 2.53 (77%) FEV1 2.19 (86%) Ratio 87% Interpretation: No obstructive lung disease. Reduced FVC and FEV1  04/04/18 FVC 2.59  (78%) FEV1 1.93 (76%) Ratio 75% Interpretation: No obstructive lung disease. Reduced FVC and FEV1.  05/09/18 FVC 2.43 (73%) FEV1 1.6 (63%) Ratio 66% Interpretation: Moderate obstructive lung disease  11/06/18 FVC 2.92 (89%) FEV1 2.04 (74%) Ratio 74  (Pred 78) TLC 93% DLCO 100%. F-V loops suggestive of small airway disease Interpretation: Mild obstructive defect. No significant BD  03/17/20 FVC 2.53 (79%) FEV1 1.96 (80%) Ratio 65  TLC 87% DLCO 101%. Significant BD response in FEV1 Interpretation: Mild obstructive defect with significant bronchodilator response     Latest Ref Rng & Units 04/10/2022    9:13 AM 02/10/2022    9:21 AM 01/13/2022    9:06 AM  CBC  WBC 4.0 - 10.5 K/uL 4.0  2.8  2.3   Hemoglobin 12.0 - 15.0 g/dL 13.4  13.3  13.1   Hematocrit 36.0 - 46.0 % 39.3  39.4  37.5  Platelets 150.0 - 400.0 K/uL 311.0  199.0  212   Improved leukopenia on reduced azathioprine      Latest Ref Rng & Units 04/10/2022    9:13 AM 01/13/2022    9:06 AM 11/16/2021    9:59 AM  BMP  Glucose 70 - 99 mg/dL 96  82  96   BUN 6 - 23 mg/dL _0 Creatinine 0.40 - 1.20 mg/dL 0.88  0.84  0.94   BUN/Creat Ratio 6 - 22 (calc)  SEE NOTE:    Sodium 135 - 145 mEq/L 141  142  141   Potassium 3.5 - 5.1 mEq/L 3.7  3.4  3.3   Chloride 96 - 112 mEq/L 104  104  101   CO2 19 - 32 mEq/L _1 Calcium 8.4 - 10.5 mg/dL 9.8  9.4  9.6   Normal lytes      Latest Ref Rng & Units 04/10/2022    9:13 AM 01/13/2022    9:06 AM 11/16/2021    9:59 AM  Hepatic Function  Total Protein 6.0 - 8.3 g/dL 6.5  6.0  6.7   Albumin 3.5 - 5.2 g/dL 4.3   4.7   AST 0 - 37 U/L 21  26  34   ALT 0 - 35 U/L 14  14  34   Alk Phosphatase 39 - 117 U/L 42   56   Total Bilirubin 0.2 - 1.2 mg/dL 0.6  0.7  0.6   Normal LFTs  Assessment & Plan:   Discussion: 66 year old female with sarcoidosis, asthma, osteoporosis, GERD, HLD, HTN presents for follow-up for sarcoid flare.  Has had difficulty tolerating immunosuppressants  due to leukopenia on methotrexate and high-dose azathioprine.  Currently tolerating half dose azathioprine but now experiencing some cough. Unclear if related to postviral illness versus breakthrough sarcoid. Discussed alternative options including Acthar, Infliximab and alternating doses of azthioprine (25, 50) for persistent breakthrough symptoms in the future.  Pulmonary sarcoidosis --Dx in 11/20/18 via bronchoscopy  History of immunosuppression High risk medication management --QuantiFERON-TB 03/12/19: neg  --Prednisone 11/2018>01/2020 --Methotrexate 03/2019>06/2021. D/C'd due to leukopenia --Prednisone 06/2021>08/2021 --Azathioprine 09/09/21. Titrated down to 25 mg daily due to leukopenia --Continue azathioprine 25 mg daily --Consider Acthar of Infliximab in the future if symptoms return Sarcoidosis  Acthar Gel (repository corticotropin injection)  --Routine labs q3 months  Sarcoid Monitoring --Recent chest imaging reviewed --Annual PFTs.  Last PFTs 03/17/20 --Annual ophthalmology exam.  Last visit on 06/06/21. No active disease --MR Cardiac 11/28/19. No active disease --Monitor routine labs as needed: CBC with diff, CMET, 1, 25 and 25 hydroxy vitamin D, urinary calcium  Hypokalemia - resolved --Patient reports usually related to prednisone use.  --Discussed potassium rich diet  Asthma  --CONTINUE Advair 230-21 TWO puffs TWICE a day. REFILLED CONTINUE Albuterol TWO puffs AS NEEDED for shortness of breath, chest tightness, cough or wheezing  Health Maintenance Immunization History  Administered Date(s) Administered   Fluad Quad(high Dose 65+) 12/30/2021   Influenza Inj Mdck Quad Pf 02/13/2014, 01/21/2021   Influenza Inj Mdck Quad With Preservative 02/13/2014   Influenza Split 02/13/2013   Influenza, Seasonal, Injecte, Preservative Fre 02/13/2013   Influenza,inj,Quad PF,6+ Mos 12/21/2016, 12/25/2017, 01/02/2019, 01/08/2020, 01/17/2022   Influenza,trivalent, recombinat, inj, PF  02/09/2011, 02/15/2012   PFIZER(Purple Top)SARS-COV-2 Vaccination 07/03/2019, 08/03/2019, 02/26/2020, 01/21/2021   PNEUMOCOCCAL CONJUGATE-20 01/10/2021   Respiratory Syncytial Virus Vaccine,Recomb Aduvanted(Arexvy) 01/26/2022   Tdap 03/16/2009, 01/15/2020   Zoster Recombinat (Shingrix) 11/22/2021,  02/08/2022    Orders Placed This Encounter  Procedures   CBC w/Diff    Standing Status:   Future    Standing Expiration Date:   04/12/2023   Comp Met (CMET)    Standing Status:   Future    Standing Expiration Date:   04/12/2023   Meds ordered this encounter  Medications   fluticasone-salmeterol (ADVAIR HFA) 230-21 MCG/ACT inhaler    Sig: Inhale 2 puffs into the lungs 2 (two) times daily.    Dispense:  1 each    Refill:  5   azaTHIOprine (IMURAN) 50 MG tablet    Sig: Take 0.5 tablets (25 mg total) by mouth daily.    Dispense:  30 tablet    Refill:  2    Return in about 2 months (around 06/13/2022).   I have spent a total time of 32-minutes on the day of the appointment including chart review, data review, collecting history, coordinating care and discussing medical diagnosis and plan with the patient/family. Past medical history, allergies, medications were reviewed. Pertinent imaging, labs and tests included in this note have been reviewed and interpreted independently by me.  Fort Jennings, MD [facility] 04/12/2022 8:52 AM  Office Number [phone]

## 2022-04-12 NOTE — Patient Instructions (Signed)
Sarcoidosis --Continue azathioprine 25 mg daily --Discussed alternative options including Acthar, Infliximab and alternating doses of azthioprine (25, 50) for breakthrough symptoms in the future --REFILL Advair  Follow-up with me in Feb with labs prior to visit

## 2022-05-09 ENCOUNTER — Encounter: Payer: Self-pay | Admitting: Endocrinology

## 2022-05-09 DIAGNOSIS — K080 Exfoliation of teeth due to systemic causes: Secondary | ICD-10-CM | POA: Diagnosis not present

## 2022-05-16 ENCOUNTER — Encounter: Payer: Self-pay | Admitting: Internal Medicine

## 2022-05-16 ENCOUNTER — Ambulatory Visit: Payer: Medicare Other | Admitting: Internal Medicine

## 2022-05-16 VITALS — BP 116/70 | HR 84 | Ht 63.0 in | Wt 124.4 lb

## 2022-05-16 DIAGNOSIS — M810 Age-related osteoporosis without current pathological fracture: Secondary | ICD-10-CM

## 2022-05-16 NOTE — Patient Instructions (Signed)
Calcium 1000-1200 mg daily

## 2022-05-16 NOTE — Progress Notes (Signed)
Name: Tamara Myers  MRN/ DOB: [id number], Jan 16, 1956    Age/ Sex: 67 y.o., female     PCP: Elby Showers, MD   Reason for Endocrinology Evaluation: Osteoporosis     Initial Endocrinology Clinic Visit: 05/03/2020    PATIENT IDENTIFIER: [name] is a 67 y.o., female with a past medical history of HTN, GERD, osteoporosis, and asthma. She has followed with [facility] since 05/03/2020 for consultative assistance with management of her osteoporosis.   HISTORICAL SUMMARY: The patient was first diagnosed with osteoporosis in 2021 , with a T-score of -2.7 at the AP spine  Secondary cause: prednisone 2020-2021, for sarcoidosis, and Vit-D def.  Fractures:  T-spine in 2021 (nontraumatic), and several fingers as a child right thumb (2015-with injuries).   Osteoporotic rx: Reclast since 2022, 2nd dose 06/2021 Last DEXA result (2023): -2.3 ([facility])  Patient was followed by Dr. Loanne Drilling from 05/2020 until 07/2021 SUBJECTIVE:    Today (05/16/2022):  Tamara Myers is here for a follow-up on osteoporosis.  Patient with chronic back pain Denies recent falls , or recent fractures  Denies constipation or diarrhea  Denies heartburn   She continues to follow-up with pulmonary for sarcoidosis  She is on vitamin D3 4000 IUs daily      HISTORY:  Past Medical History:  Past Medical History:  Diagnosis Date   Cancer ([place])    basal cell carcinoma   Cataract    bilateral   Complication of anesthesia    slow to wake up   Dyspnea    on exertion   GERD (gastroesophageal reflux disease)    on meds   Hyperlipidemia    on meds   Hypertension    on meds   Melanoma ([place]) 2018   left upper arm   Moderate persistent asthma without complication 49/67/5916   on meds   Osteoporosis    confirmed by bone scan   PONV (postoperative nausea and vomiting)    Post menopausal syndrome    Pulmonary sarcoidosis ([place])    tx'd   Past Surgical History:  Past Surgical History:   Procedure Laterality Date   BREAST CYST ASPIRATION Right    CATARACT EXTRACTION, BILATERAL Bilateral 10/2020   CESAREAN SECTION     OPEN REDUCTION INTERNAL FIXATION (ORIF) METACARPAL Right 11/24/2014   Procedure: OPEN REDUCTION INTERNAL FIXATION (ORIF) RIGHT THUMB;  Surgeon: Iran Planas, MD;  Location: [facility];  Service: Orthopedics;  Laterality: Right;   skin shave biopsy  02/08/2021   left mid lateral posterior arm   thumb surgery Right 2014   VIDEO BRONCHOSCOPY N/A 11/20/2018   Procedure: Video Bronchoscopy With Fluoro;  Surgeon: Garner Nash, DO;  Location: [facility];  Service: Thoracic;  Laterality: N/A;   VIDEO BRONCHOSCOPY WITH ENDOBRONCHIAL ULTRASOUND N/A 11/20/2018   Procedure: VIDEO BRONCHOSCOPY WITH ENDOBRONCHIAL ULTRASOUND;  Surgeon: Garner Nash, DO;  Location: [facility];  Service: Thoracic;  Laterality: N/A;   Social History:  reports that she has never smoked. She has never used smokeless tobacco. She reports that she does not drink alcohol and does not use drugs. Family History:  Family History  Problem Relation Age of Onset   Osteoporosis Mother    Diabetes Father    Heart disease Father    Hyperlipidemia Father    Hypertension Father    Pulmonary fibrosis Father    Asthma Daughter    Breast cancer Neg Hx    Allergic rhinitis Neg Hx    Eczema  Neg Hx    Urticaria Neg Hx    Angioedema Neg Hx    Colon polyps Neg Hx    Colon cancer Neg Hx    Esophageal cancer Neg Hx    Rectal cancer Neg Hx    Stomach cancer Neg Hx      HOME MEDICATIONS: Allergies as of 05/16/2022   No Known Allergies      Medication List        Accurate as of May 16, 2022  1:29 PM. If you have any questions, ask your nurse or doctor.          albuterol 108 (90 Base) MCG/ACT inhaler Commonly known as: VENTOLIN HFA Inhale 2 puffs into the lungs every 6 (six) hours as needed for wheezing or shortness of breath.   amLODipine 5 MG tablet Commonly known as: NORVASC TAKE ONE  TABLET BY MOUTH DAILY   azaTHIOprine 50 MG tablet Commonly known as: IMURAN Take 0.5 tablets (25 mg total) by mouth daily.   fluticasone-salmeterol 230-21 MCG/ACT inhaler Commonly known as: Advair HFA Inhale 2 puffs into the lungs 2 (two) times daily.   nystatin 100000 UNIT/ML suspension Commonly known as: MYCOSTATIN Take 5 mLs by mouth 4 (four) times daily.   pantoprazole 40 MG tablet Commonly known as: PROTONIX TAKE 1 TABLET BY MOUTH DAILY   potassium chloride SA 20 MEQ tablet Commonly known as: KLOR-CON M Take 1 tablet (20 mEq total) by mouth daily.   RECLAST IV Inject into the vein.   rosuvastatin 10 MG tablet Commonly known as: CRESTOR TAKE ONE TABLET BY MOUTH DAILY AT 6PM   triamterene-hydrochlorothiazide 37.5-25 MG tablet Commonly known as: MAXZIDE-25 TAKE ONE TABLET BY MOUTH DAILY          OBJECTIVE:   PHYSICAL EXAM: VS: BP 116/70 (BP Location: Left Arm, Patient Position: Sitting, Cuff Size: Small)   Pulse 84   Ht '5\' 3"'$  (1.6 m)   Wt 124 lb 6.4 oz (56.4 kg)   SpO2 99%   BMI 22.04 kg/m    EXAM: General: Pt appears well and is in NAD  Eyes: External eye exam normal without stare, lid lag or exophthalmos.  EOM intact.    Neck: General: Supple without adenopathy. Thyroid: Thyroid size normal.  No goiter or nodules appreciated. No thyroid bruit.  Lungs: Clear with good BS bilat with no rales, rhonchi, or wheezes  Heart: Auscultation: RRR.  Abdomen: Normoactive bowel sounds, soft, nontender, without masses or organomegaly palpable  Extremities:  BL LE: No pretibial edema normal ROM and strength.  Mental Status: Judgment, insight: Intact Orientation: Oriented to time, place, and person Mood and affect: No depression, anxiety, or agitation     DATA REVIEWED:    Latest Reference Range & Units 04/10/22 09:13  Sodium 135 - 145 mEq/L 141  Potassium 3.5 - 5.1 mEq/L 3.7  Chloride 96 - 112 mEq/L 104  CO2 19 - 32 mEq/L 28  Glucose 70 - 99 mg/dL 96   BUN 6 - 23 mg/dL 14  Creatinine 0.40 - 1.20 mg/dL 0.88  Calcium 8.4 - 10.5 mg/dL 9.8  Alkaline Phosphatase 39 - 117 U/L 42  Albumin 3.5 - 5.2 g/dL 4.3  AST 0 - 37 U/L 21  ALT 0 - 35 U/L 14  Total Protein 6.0 - 8.3 g/dL 6.5  Total Bilirubin 0.2 - 1.2 mg/dL 0.6  GFR >60.00 mL/min 68.69   DXA 05/03/2021  ASSESSMENT: The BMD measured at Femur Total Left is 0.723 g/cm2 with a T-score  of -2.3. This patient is considered osteopenic/low bone mass according to [facility]) criteria.   The quality of the exam is good. L2, L3 was excluded due to being excluded on previous exam.   Patient does not meet criteria for FRAX due to recent use of Reclast.   Site Region Measured Date Measured Age YA BMD Significant CHANGE T-score AP Spine L1-L4 (L2,L3) 05/03/2021 65.0 -1.9 0.932 g/cm2 AP Spine L1-L4 (L2,L3) 01/27/2020 63.7 -2.1 0.927 g/cm2   DualFemur Total Left 05/03/2021 65.0 -2.3 0.723 g/cm2 DualFemur Total Left 01/27/2020 63.7 -2.3 0.722 g/cm2   DualFemur Total Mean 05/03/2021 65.0 -2.2 0.733 g/cm2 DualFemur Total Mean 01/27/2020 63.7 -2.2 0.731 g/cm2    ASSESSMENT / PLAN / RECOMMENDATIONS:   Osteoporosis  - Emphasized importance of optimizing calcium and vitamin D intake as well as weight bearing exercise - She is currently not on calcium, we discussed OTC calcium tablets, vs diet or a hybrid model -Patient has been on zoledronic acid since 2022 -Repeat DXA in 2023 showed improvement of BMD at the spine with a T-score -2.7 to -1.9, with stability at the hips.  Medications  Calcium 1000-1200 mg daily  Vitamin D 4000 iu daily  Zoledronic acid    F/U in 1 yr    Signed electronically by: Mack Guise, MD  [facility]  [facility] [address] [place], [place] [address] Phone: [phone] FAX: [phone]      CC: Elby Showers, MD [address] [place] [address] Phone: [phone]   Fax: [phone]   Return to Endocrinology clinic as below: Future Appointments  Date Time Provider [place]  06/19/2022  9:30 AM Margaretha Seeds, MD [facility]  01/15/2023  9:20 AM [facility]  01/19/2023 10:45 AM Baxley, Cresenciano Lick, MD [facility]

## 2022-05-18 ENCOUNTER — Encounter: Payer: Self-pay | Admitting: Endocrinology

## 2022-05-18 ENCOUNTER — Other Ambulatory Visit: Payer: Self-pay | Admitting: Pharmacy Technician

## 2022-05-18 NOTE — Addendum Note (Signed)
Addended by: Dorita Sciara on: 05/18/2022 10:56 AM   Modules accepted: Orders

## 2022-05-29 ENCOUNTER — Encounter (HOSPITAL_BASED_OUTPATIENT_CLINIC_OR_DEPARTMENT_OTHER): Payer: Self-pay | Admitting: Pulmonary Disease

## 2022-06-01 ENCOUNTER — Ambulatory Visit (INDEPENDENT_AMBULATORY_CARE_PROVIDER_SITE_OTHER): Payer: Medicare Other | Admitting: Pulmonary Disease

## 2022-06-01 ENCOUNTER — Encounter (HOSPITAL_BASED_OUTPATIENT_CLINIC_OR_DEPARTMENT_OTHER): Payer: Self-pay | Admitting: Pulmonary Disease

## 2022-06-01 VITALS — BP 116/82 | HR 80 | Temp 97.8°F | Ht 63.0 in | Wt 124.0 lb

## 2022-06-01 DIAGNOSIS — D869 Sarcoidosis, unspecified: Secondary | ICD-10-CM | POA: Diagnosis not present

## 2022-06-01 MED ORDER — AZITHROMYCIN 250 MG PO TABS: ORAL_TABLET | ORAL | 0 refills | Status: DC

## 2022-06-01 NOTE — Patient Instructions (Addendum)
Acute cough vs sarcoid flare --Azithromycin ordered --Mucinex twice a day x 7 days  Sarcoid --ORDER CT chest without contrast when next available --Continue azathioprine 25 mg daily --Consider Acthar or Infliximab in the future if symptoms return Sarcoidosis  Acthar Gel (repository corticotropin injection)   Keep follow-up on 2/19 to discuss CT results

## 2022-06-01 NOTE — Progress Notes (Signed)
Subjective:   PATIENT ID: Tamara Myers GENDER: female DOB: 1955/12/14, MRN: [id number]   HPI  Chief Complaint  Patient presents with   Follow-up    Took covid test 2 wks ago negative   Reason for Visit: Follow-up sarcoid  Tamara Myers is a 67 year old female with sarcoidosis, asthma, osteoporosis, GERD, HLD, HTN who presents for follow-up  Synopsis: She initially was seen by Dr. Valeta Harms in 2020 with symptoms of progressive cough and CT demonstrating perilymphatic micro-nodularity and bilateral hilar adenopathy including 1.4 cm hilar lymph node and 1cm RLL nodule consistent with sarcoid. Bronchoscopy on 11/20/18 with granulomatous inflammation. Started on prednisone in August 2020 with clinical improvement and improvement of parenchymal changes and nodularity. Started on methotrexate 10 mg and steroids November 2020. Continue steroids until 01/2020 and maintained only methotrexate with stable respiratory symptoms. Attempted methotrexate taper in fall 2022 however symptoms recurred. Has been controlled on methotrexate on low dosage 5-7.5. Had to discontinue in March 2023 due to leukopenia. Had to be restarted on prednisone. Started on azathioprine May 2023 and currently on reduced dosing due to leukopenia.  04/12/22 Since our last visit she reports improved symptoms however did develop a cough in the last three weeks. This may have been preceded by cold but not sure if it is related. Using albuterol once a week and advair as directed. Cough is sometimes productive. No wheezing. Compliant with azathioprine. Denies side effects (nausea, rash, hair loss, discoloration of urine or tears).  06/01/22 She has continued to have productive cough that has now progressed to green sputum to whitish sputum. Increased frequency. Denies fevers/chills. Compliant with azathioprine. 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 from pulmonary fibrosis  Past Medical History:  Diagnosis Date   Cancer ([place])    basal cell carcinoma   Cataract    bilateral   Complication of anesthesia    slow to wake up   Dyspnea    on exertion   GERD (gastroesophageal reflux disease)    on meds   Hyperlipidemia    on meds   Hypertension    on meds   Melanoma ([place]) 2018   left upper arm   Moderate persistent asthma without complication 25/36/6440   on meds   Osteoporosis    confirmed by bone scan   PONV (postoperative nausea and vomiting)    Post menopausal syndrome    Pulmonary sarcoidosis ([place])    tx'd     Family History  Problem Relation Age of Onset   Osteoporosis Mother    Diabetes Father    Heart disease Father    Hyperlipidemia Father    Hypertension Father    Pulmonary fibrosis Father    Asthma Daughter    Breast cancer Neg Hx    Allergic rhinitis Neg Hx    Eczema Neg Hx    Urticaria Neg Hx    Angioedema Neg Hx    Colon polyps Neg Hx    Colon cancer Neg Hx    Esophageal cancer Neg Hx    Rectal cancer Neg Hx    Stomach cancer Neg Hx      Social History   Occupational History   Not on file  Tobacco Use   Smoking status: Never   Smokeless tobacco: Never  Vaping Use   Vaping Use: Never used  Substance and Sexual Activity   Alcohol use: No  Drug use: No   Sexual activity: Not on file    No Known Allergies   Outpatient Medications Prior to Visit  Medication Sig Dispense Refill   albuterol (VENTOLIN HFA) 108 (90 Base) MCG/ACT inhaler Inhale 2 puffs into the lungs every 6 (six) hours as needed for wheezing or shortness of breath. 18 g 1   amLODipine (NORVASC) 5 MG tablet TAKE ONE TABLET BY MOUTH DAILY 90 tablet 1   azaTHIOprine (IMURAN) 50 MG tablet Take 0.5 tablets (25 mg total) by mouth daily. 30 tablet 2   fluticasone-salmeterol (ADVAIR HFA) 230-21 MCG/ACT inhaler Inhale 2 puffs into the lungs 2 (two) times daily. 1 each 5   pantoprazole (PROTONIX)  40 MG tablet TAKE 1 TABLET BY MOUTH DAILY 90 tablet 3   potassium chloride SA (KLOR-CON M) 20 MEQ tablet Take 1 tablet (20 mEq total) by mouth daily. 90 tablet 3   rosuvastatin (CRESTOR) 10 MG tablet TAKE ONE TABLET BY MOUTH DAILY AT 6PM 90 tablet 3   triamterene-hydrochlorothiazide (MAXZIDE-25) 37.5-25 MG tablet TAKE ONE TABLET BY MOUTH DAILY 90 tablet 1   nystatin (MYCOSTATIN) 100000 UNIT/ML suspension Take 5 mLs by mouth 4 (four) times daily. (Patient not taking: Reported on 06/01/2022)     No facility-administered medications prior to visit.    Review of Systems  Constitutional:  Negative for chills, diaphoresis, fever, malaise/fatigue and weight loss.  HENT:  Negative for congestion.   Respiratory:  Positive for cough and sputum production. Negative for hemoptysis, shortness of breath and wheezing.   Cardiovascular:  Negative for chest pain, palpitations and leg swelling.     Objective:   Vitals:   06/01/22 0953  BP: 116/82  Pulse: 80  Temp: 97.8 F (36.6 C)  TempSrc: Oral  SpO2: 97%  Weight: 124 lb 0.1 oz (56.3 kg)  Height: '5\' 3"'$  (1.6 m)   SpO2: 97 % O2 Device: None (Room air)  Physical Exam: General: Well-appearing, no acute distress HENT: [place], AT Eyes: EOMI, no scleral icterus Respiratory: Clear to auscultation bilaterally.  No crackles, wheezing or rales Cardiovascular: RRR, -M/R/G, no JVD Extremities:-Edema,-tenderness Neuro: AAO x4, CNII-XII grossly intact Psych: Normal mood, normal affect Skin: No rash  Data Reviewed:  Imaging:  CT Chest 07/02/18 - Bilateral nodularity with perilymphatic distribution including 1cm RLL nodule. Right hilar enlargement.  CT Chest 10/18/18 - Progressive bilateral nodularity including RLL nodule ~741m  CT Chest 03/10/19 - Improved parenchymal changes with mild disease in RML, lingular and superior LLL  CT Chest HR 06/30/19 - Improved parenchymal changes in setting of immunosuppression. RLL 5 mm subpleural nodule.  CT Chest HR  07/15/20 - No significant parenchymal abnormalities or consolidations seen. Stable calcified mediastinal and bilateral hilar nodes. New 290msolid RUL nodule likely benign. Stable peripheral RUL GGO stable and RLL 41m80module stable since 06/2019.  PFT: 01/23/18 FVC 2.53 (77%) FEV1 2.19 (86%) Ratio 87% Interpretation: No obstructive lung disease. Reduced FVC and FEV1  04/04/18 FVC 2.59 (78%) FEV1 1.93 (76%) Ratio 75% Interpretation: No obstructive lung disease. Reduced FVC and FEV1.  05/09/18 FVC 2.43 (73%) FEV1 1.6 (63%) Ratio 66% Interpretation: Moderate obstructive lung disease  11/06/18 FVC 2.92 (89%) FEV1 2.04 (74%) Ratio 74  (Pred 78) TLC 93% DLCO 100%. F-V loops suggestive of small airway disease Interpretation: Mild obstructive defect. No significant BD  03/17/20 FVC 2.53 (79%) FEV1 1.96 (80%) Ratio 65  TLC 87% DLCO 101%. Significant BD response in FEV1 Interpretation: Mild obstructive defect with significant bronchodilator response  Latest Ref Rng & Units 04/10/2022    9:13 AM 02/10/2022    9:21 AM 01/13/2022    9:06 AM  CBC  WBC 4.0 - 10.5 K/uL 4.0  2.8  2.3   Hemoglobin 12.0 - 15.0 g/dL 13.4  13.3  13.1   Hematocrit 36.0 - 46.0 % 39.3  39.4  37.5   Platelets 150.0 - 400.0 K/uL 311.0  199.0  212   Resolved leukopenia      Latest Ref Rng & Units 04/10/2022    9:13 AM 01/13/2022    9:06 AM 11/16/2021    9:59 AM  BMP  Glucose 70 - 99 mg/dL 96  82  96   BUN 6 - 23 mg/dL '14  14  16   '$ Creatinine 0.40 - 1.20 mg/dL 0.88  0.84  0.94   BUN/Creat Ratio 6 - 22 (calc)  SEE NOTE:    Sodium 135 - 145 mEq/L 141  142  141   Potassium 3.5 - 5.1 mEq/L 3.7  3.4  3.3   Chloride 96 - 112 mEq/L 104  104  101   CO2 19 - 32 mEq/L '28  30  31   '$ Calcium 8.4 - 10.5 mg/dL 9.8  9.4  9.6   Normal lytes     Latest Ref Rng & Units 04/10/2022    9:13 AM 01/13/2022    9:06 AM 11/16/2021    9:59 AM  Hepatic Function  Total Protein 6.0 - 8.3 g/dL 6.5  6.0  6.7   Albumin 3.5 - 5.2 g/dL 4.3   4.7    AST 0 - 37 U/L 21  26  34   ALT 0 - 35 U/L 14  14  34   Alk Phosphatase 39 - 117 U/L 42   56   Total Bilirubin 0.2 - 1.2 mg/dL 0.6  0.7  0.6   Normal LFTs   Assessment & Plan:   Discussion: 67 year old female with sarcoidosis, asthma, osteoporosis, GERD, HLD, HTN who presents for follow-up for sarcoid flare.  Has had difficulty tolerating immunosuppressants due to leukopenia on methotrexate and high-dose azathioprine.  Currently tolerating half dose azathioprine but now experiencing persistent cough. May be infectious, post-viral vs sarcoid flare. Discussed immunosuppressant plan but will need chest imaging before proceeding. Will consider alternating doses of azathioprine (25, 50) but would need to monitor labs  Pulmonary sarcoidosis --Dx in 11/20/18 via bronchoscopy  History of immunosuppression High risk medication management --QuantiFERON-TB 03/12/19: neg  --Prednisone 11/2018>01/2020 --Methotrexate 03/2019>06/2021. D/C'd due to leukopenia --Prednisone 06/2021>08/2021 --Azathioprine 09/09/21. Titrated down to 25 mg daily due to leukopenia --Continue azathioprine 25 mg daily --Consider alternating azathioprine dosing, Acthar or Infliximab in the future if CT demonstrates sarcoid Sarcoidosis  Acthar Gel (repository corticotropin injection)  --Routine labs q3 months  Sarcoid Monitoring --Recent chest imaging reviewed. ORDER CT Chest without contrast --Annual PFTs.  Last PFTs 03/17/20 --Annual ophthalmology exam.  Last visit on 06/06/21. No active disease. Scheduled for 06/2022 --MR Cardiac 11/28/19. No active disease --Monitor routine labs as needed: CBC with diff, CMET, 1, 25 and 25 hydroxy vitamin D, urinary calcium  Asthma  --CONTINUE Advair 230-21 TWO puffs TWICE a day --CONTINUE Albuterol TWO puffs AS NEEDED for shortness of breath, chest tightness, cough or wheezing  Health Maintenance Immunization History  Administered Date(s) Administered   Fluad Quad(high Dose 65+)  12/30/2021   Influenza Inj Mdck Quad Pf 02/13/2014, 01/21/2021   Influenza Inj Mdck Quad With Preservative 02/13/2014   Influenza Split 02/13/2013  Influenza, Seasonal, Injecte, Preservative Fre 02/13/2013   Influenza,inj,Quad PF,6+ Mos 12/21/2016, 12/25/2017, 01/02/2019, 01/08/2020, 01/17/2022   Influenza,trivalent, recombinat, inj, PF 02/09/2011, 02/15/2012   PFIZER(Purple Top)SARS-COV-2 Vaccination 07/03/2019, 08/03/2019, 02/26/2020, 01/21/2021   PNEUMOCOCCAL CONJUGATE-20 01/10/2021   Respiratory Syncytial Virus Vaccine,Recomb Aduvanted(Arexvy) 01/26/2022   Tdap 03/16/2009, 01/15/2020   Zoster Recombinat (Shingrix) 11/22/2021, 02/08/2022    Orders Placed This Encounter  Procedures   CT Chest Wo Contrast    Standing Status:   Future    Standing Expiration Date:   06/02/2023    Order Specific Question:   Preferred imaging location?    Answer:   [facility]   Meds ordered this encounter  Medications   azithromycin (ZITHROMAX) 250 MG tablet    Sig: Take two tablets on day 1, then one tablet daily on day 2-5.    Dispense:  6 tablet    Refill:  0    No follow-ups on file. 06/19/22  I have spent a total time of 25-minutes on the day of the appointment including chart review, data review, collecting history, coordinating care and discussing medical diagnosis and plan with the patient/family. Past medical history, allergies, medications were reviewed. Pertinent imaging, labs and tests included in this note have been reviewed and interpreted independently by me.  [place], MD [facility] 06/01/2022 10:19 AM  Office Number [phone]

## 2022-06-08 ENCOUNTER — Encounter: Payer: Self-pay | Admitting: Internal Medicine

## 2022-06-08 DIAGNOSIS — H524 Presbyopia: Secondary | ICD-10-CM | POA: Diagnosis not present

## 2022-06-08 DIAGNOSIS — D869 Sarcoidosis, unspecified: Secondary | ICD-10-CM | POA: Diagnosis not present

## 2022-06-08 DIAGNOSIS — Z961 Presence of intraocular lens: Secondary | ICD-10-CM | POA: Diagnosis not present

## 2022-06-08 DIAGNOSIS — H35362 Drusen (degenerative) of macula, left eye: Secondary | ICD-10-CM | POA: Diagnosis not present

## 2022-06-08 DIAGNOSIS — H04123 Dry eye syndrome of bilateral lacrimal glands: Secondary | ICD-10-CM | POA: Diagnosis not present

## 2022-06-08 LAB — HM DIABETES EYE EXAM

## 2022-06-10 ENCOUNTER — Ambulatory Visit (HOSPITAL_BASED_OUTPATIENT_CLINIC_OR_DEPARTMENT_OTHER)
Admission: RE | Admit: 2022-06-10 | Discharge: 2022-06-10 | Disposition: A | Discharge status: Home / Self Care | Payer: Medicare Other | Source: Ambulatory Visit | Attending: Pulmonary Disease | Admitting: Pulmonary Disease

## 2022-06-10 DIAGNOSIS — R918 Other nonspecific abnormal finding of lung field: Secondary | ICD-10-CM | POA: Diagnosis not present

## 2022-06-10 DIAGNOSIS — J849 Interstitial pulmonary disease, unspecified: Secondary | ICD-10-CM | POA: Diagnosis not present

## 2022-06-10 DIAGNOSIS — D869 Sarcoidosis, unspecified: Secondary | ICD-10-CM | POA: Insufficient documentation

## 2022-06-16 ENCOUNTER — Other Ambulatory Visit: Payer: Self-pay | Admitting: Pulmonary Disease

## 2022-06-16 DIAGNOSIS — D869 Sarcoidosis, unspecified: Secondary | ICD-10-CM | POA: Diagnosis not present

## 2022-06-19 ENCOUNTER — Telehealth: Payer: Self-pay | Admitting: Pulmonary Disease

## 2022-06-19 ENCOUNTER — Telehealth: Payer: Self-pay | Admitting: Pharmacist

## 2022-06-19 ENCOUNTER — Ambulatory Visit (HOSPITAL_BASED_OUTPATIENT_CLINIC_OR_DEPARTMENT_OTHER): Payer: Medicare Other | Admitting: Pulmonary Disease

## 2022-06-19 ENCOUNTER — Telehealth: Payer: Self-pay | Admitting: Gastroenterology

## 2022-06-19 ENCOUNTER — Encounter (HOSPITAL_BASED_OUTPATIENT_CLINIC_OR_DEPARTMENT_OTHER): Payer: Self-pay | Admitting: Pulmonary Disease

## 2022-06-19 VITALS — BP 118/70 | HR 75 | Ht 63.0 in | Wt 124.4 lb

## 2022-06-19 DIAGNOSIS — D84821 Immunodeficiency due to drugs: Secondary | ICD-10-CM

## 2022-06-19 DIAGNOSIS — D869 Sarcoidosis, unspecified: Secondary | ICD-10-CM | POA: Diagnosis not present

## 2022-06-19 DIAGNOSIS — Z79899 Other long term (current) drug therapy: Secondary | ICD-10-CM

## 2022-06-19 LAB — CBC WITH DIFFERENTIAL/PLATELET
Basophils Absolute: 0 10*3/uL (ref 0.0–0.2)
Basos: 0 %
EOS (ABSOLUTE): 0.1 10*3/uL (ref 0.0–0.4)
Eos: 2 %
Hematocrit: 39.1 % (ref 34.0–46.6)
Hemoglobin: 13.2 g/dL (ref 11.1–15.9)
Immature Grans (Abs): 0 10*3/uL (ref 0.0–0.1)
Immature Granulocytes: 0 %
Lymphocytes Absolute: 0.7 10*3/uL (ref 0.7–3.1)
Lymphs: 31 %
MCH: 29.8 pg (ref 26.6–33.0)
MCHC: 33.8 g/dL (ref 31.5–35.7)
MCV: 88 fL (ref 79–97)
Monocytes Absolute: 0.4 10*3/uL (ref 0.1–0.9)
Monocytes: 17 %
Neutrophils Absolute: 1.2 10*3/uL — ABNORMAL LOW (ref 1.4–7.0)
Neutrophils: 50 %
Platelets: 260 10*3/uL (ref 150–450)
RBC: 4.43 x10E6/uL (ref 3.77–5.28)
RDW: 12.8 % (ref 11.7–15.4)
WBC: 2.4 10*3/uL — CL (ref 3.4–10.8)

## 2022-06-19 LAB — COMPREHENSIVE METABOLIC PANEL
ALT: 22 IU/L (ref 0–32)
AST: 28 IU/L (ref 0–40)
Albumin/Globulin Ratio: 2.8 — ABNORMAL HIGH (ref 1.2–2.2)
Albumin: 4.5 g/dL (ref 3.9–4.9)
Alkaline Phosphatase: 54 IU/L (ref 44–121)
BUN/Creatinine Ratio: 12 (ref 12–28)
BUN: 10 mg/dL (ref 8–27)
Bilirubin Total: 0.6 mg/dL (ref 0.0–1.2)
CO2: 24 mmol/L (ref 20–29)
Calcium: 9.2 mg/dL (ref 8.7–10.3)
Chloride: 103 mmol/L (ref 96–106)
Creatinine, Ser: 0.83 mg/dL (ref 0.57–1.00)
Globulin, Total: 1.6 g/dL (ref 1.5–4.5)
Glucose: 96 mg/dL (ref 70–99)
Potassium: 3.3 mmol/L — ABNORMAL LOW (ref 3.5–5.2)
Sodium: 143 mmol/L (ref 134–144)
Total Protein: 6.1 g/dL (ref 6.0–8.5)
eGFR: 78 mL/min/{1.73_m2} (ref >59)

## 2022-06-19 NOTE — Telephone Encounter (Signed)
Discussed with patient in clinic today. Closing encounter.

## 2022-06-19 NOTE — Telephone Encounter (Signed)
[facility] advised that our office did not order the labs they are calling in regards too.  They thanked me for calling and will let the referring office know.

## 2022-06-19 NOTE — Telephone Encounter (Addendum)
Referral to [facility] is pending baseline labs drawn today  ----- Message from [place], MD sent at 06/19/2022 10:12 AM EST ----- Please start Infliximab for refractory sarcoid for patient. Failed methotrexate and Azathioprine due to leukopenia. Progressive pulmonary sarcoid on CT

## 2022-06-19 NOTE — Telephone Encounter (Signed)
Received call report from [place]  CBC- WBC 2.4- date of collection 06/16/22  Routing to Dr Loanne Drilling

## 2022-06-19 NOTE — Progress Notes (Signed)
Will plicae referral to [facility] for Remicade once baseline labs have resulted from today  Knox Saliva, PharmD, MPH, BCPS, CPP Clinical Pharmacist (Rheumatology and Pulmonology)

## 2022-06-19 NOTE — Progress Notes (Unsigned)
Subjective:   PATIENT ID: Tamara Myers GENDER: female DOB: 11-10-1955, MRN: [id number]   HPI  Chief Complaint  Patient presents with   Follow-up    Antibiotic seemed to help feeling better   Reason for Visit: Follow-up sarcoid  Tamara Myers is a 67 year old female with sarcoidosis, asthma, osteoporosis, GERD, HLD, HTN who presents for follow-up  Synopsis: She initially was seen by Dr. Valeta Harms in 2020 with symptoms of progressive cough and CT demonstrating perilymphatic micro-nodularity and bilateral hilar adenopathy including 1.4 cm hilar lymph node and 1cm RLL nodule consistent with sarcoid. Bronchoscopy on 11/20/18 with granulomatous inflammation. Started on prednisone in August 2020 with clinical improvement and improvement of parenchymal changes and nodularity. Started on methotrexate 10 mg and steroids November 2020. Continue steroids until 01/2020 and maintained only methotrexate with stable respiratory symptoms. Attempted methotrexate taper in fall 2022 however symptoms recurred. Has been controlled on methotrexate on low dosage 5-7.5. Had to discontinue in March 2023 due to leukopenia. Had to be restarted on prednisone. Started on azathioprine May 2023 and currently on reduced dosing due to leukopenia.  04/12/22 Since our last visit she reports improved symptoms however did develop a cough in the last three weeks. This may have been preceded by cold but not sure if it is related. Using albuterol once a week and advair as directed. Cough is sometimes productive. No wheezing. Compliant with azathioprine. Denies side effects (nausea, rash, hair loss, discoloration of urine or tears).  06/01/22 She has continued to have productive cough that has now progressed to green sputum to whitish sputum. Increased frequency. Denies fevers/chills. Compliant with azathioprine. Denies side effects (nausea, rash, hair loss, discoloration of urine or tears)  06/19/22 Since our last visit she  completed her antibiotic. Cough has improved. Denies shortness of breath or wheezing. She has been compliant with low-dose azathioprine but noticed her labs demonstrated leukopenia again.  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  Past Medical History:  Diagnosis Date   Cancer ([place])    basal cell carcinoma   Cataract    bilateral   Complication of anesthesia    slow to wake up   Dyspnea    on exertion   GERD (gastroesophageal reflux disease)    on meds   Hyperlipidemia    on meds   Hypertension    on meds   Melanoma ([place]) 2018   left upper arm   Moderate persistent asthma without complication [id number]   on meds   Osteoporosis    confirmed by bone scan   PONV (postoperative nausea and vomiting)    Post menopausal syndrome    Pulmonary sarcoidosis ([facility])    tx'd     Family History  Problem Relation Age of Onset   Osteoporosis Mother    Diabetes Father    Heart disease Father    Hyperlipidemia Father    Hypertension Father    Pulmonary fibrosis Father    Asthma Daughter    Breast cancer Neg Hx    Allergic rhinitis Neg Hx    Eczema Neg Hx    Urticaria Neg Hx    Angioedema Neg Hx    Colon polyps Neg Hx    Colon cancer Neg Hx    Esophageal cancer Neg Hx    Rectal cancer Neg Hx    Stomach cancer Neg Hx      Social History   Occupational History   Not on file  Tobacco Use   Smoking status: Never   Smokeless tobacco: Never  Vaping Use   Vaping Use: Never used  Substance and Sexual Activity   Alcohol use: No   Drug use: No   Sexual activity: Not on file    No Known Allergies   Outpatient Medications Prior to Visit  Medication Sig Dispense Refill   albuterol (VENTOLIN HFA) 108 (90 Base) MCG/ACT inhaler Inhale 2 puffs into the lungs every 6 (six) hours as needed for wheezing or shortness of breath. 18 g 1   amLODipine (NORVASC) 5 MG tablet TAKE ONE TABLET BY MOUTH DAILY 90 tablet 1   azaTHIOprine  (IMURAN) 50 MG tablet Take 0.5 tablets (25 mg total) by mouth daily. 30 tablet 2   azithromycin (ZITHROMAX) 250 MG tablet Take two tablets on day 1, then one tablet daily on day 2-5. 6 tablet 0   fluticasone-salmeterol (ADVAIR HFA) 230-21 MCG/ACT inhaler Inhale 2 puffs into the lungs 2 (two) times daily. 1 each 5   pantoprazole (PROTONIX) 40 MG tablet TAKE 1 TABLET BY MOUTH DAILY 90 tablet 3   potassium chloride SA (KLOR-CON M) 20 MEQ tablet Take 1 tablet (20 mEq total) by mouth daily. 90 tablet 3   rosuvastatin (CRESTOR) 10 MG tablet TAKE ONE TABLET BY MOUTH DAILY AT 6PM 90 tablet 3   triamterene-hydrochlorothiazide (MAXZIDE-25) 37.5-25 MG tablet TAKE ONE TABLET BY MOUTH DAILY 90 tablet 1   nystatin (MYCOSTATIN) 100000 UNIT/ML suspension Take 5 mLs by mouth 4 (four) times daily. (Patient not taking: Reported on 06/01/2022)     No facility-administered medications prior to visit.    Review of Systems  Constitutional:  Negative for chills, diaphoresis, fever, malaise/fatigue and weight loss.  HENT:  Negative for congestion.   Respiratory:  Positive for cough. Negative for hemoptysis, sputum production, shortness of breath and wheezing.   Cardiovascular:  Negative for chest pain, palpitations and leg swelling.     Objective:   Vitals:   06/19/22 0938  BP: 118/70  Pulse: 75  SpO2: 98%  Weight: 124 lb 6.4 oz (56.4 kg)  Height: 5' 3"$  (1.6 m)    SpO2: 98 % O2 Device: None (Room air)  Body mass index is 22.04 kg/m.  Physical Exam: General: Well-appearing, no acute distress HENT: [place], AT Eyes: EOMI, no scleral icterus Respiratory: Clear to auscultation bilaterally.  No crackles, wheezing or rales Cardiovascular: RRR, -M/R/G, no JVD Extremities:-Edema,-tenderness Neuro: AAO x4, CNII-XII grossly intact Psych: Normal mood, normal affect  Data Reviewed:  Imaging:  CT Chest 07/02/18 - Bilateral nodularity with perilymphatic distribution including 1cm RLL nodule. Right hilar  enlargement.  CT Chest 10/18/18 - Progressive bilateral nodularity including RLL nodule ~61m  CT Chest 03/10/19 - Improved parenchymal changes with mild disease in RML, lingular and superior LLL  CT Chest HR 06/30/19 - Improved parenchymal changes in setting of immunosuppression. RLL 5 mm subpleural nodule.  CT Chest HR 07/15/20 - No significant parenchymal abnormalities or consolidations seen. Stable calcified mediastinal and bilateral hilar nodes. New 263msolid RUL nodule likely benign. Stable peripheral RUL GGO stable and RLL 62m21module stable since 06/2019.  CT Chest 06/10/22 - Progressive pulmonary sarcoid with several new scattered irregular nodules including 10 mm LLL nodule and new GGO in RLL and RML, new subpleural consolidation in superior LLL  PFT: 01/23/18 FVC 2.53 (77%) FEV1 2.19 (86%) Ratio 87% Interpretation: No obstructive lung disease. Reduced FVC and FEV1  04/04/18 FVC 2.59 (78%) FEV1 1.93 (76%) Ratio 75% Interpretation: No obstructive  lung disease. Reduced FVC and FEV1.  05/09/18 FVC 2.43 (73%) FEV1 1.6 (63%) Ratio 66% Interpretation: Moderate obstructive lung disease  11/06/18 FVC 2.92 (89%) FEV1 2.04 (74%) Ratio 74  (Pred 78) TLC 93% DLCO 100%. F-V loops suggestive of small airway disease Interpretation: Mild obstructive defect. No significant BD  03/17/20 FVC 2.53 (79%) FEV1 1.96 (80%) Ratio 65  TLC 87% DLCO 101%. Significant BD response in FEV1 Interpretation: Mild obstructive defect with significant bronchodilator response     Latest Ref Rng & Units 06/16/2022    9:19 AM 04/10/2022    9:13 AM 02/10/2022    9:21 AM  CBC  WBC 3.4 - 10.8 x10E3/uL 2.4  4.0  2.8   Hemoglobin 11.1 - 15.9 g/dL 13.2  13.4  13.3   Hematocrit 34.0 - 46.6 % 39.1  39.3  39.4   Platelets 150 - 450 x10E3/uL 260  311.0  199.0   Recurrent leukopenia on low dose azathioprine      Latest Ref Rng & Units 06/16/2022    9:19 AM 04/10/2022    9:13 AM 01/13/2022    9:06 AM  BMP  Glucose 70 - 99  mg/dL 96  96  82   BUN 8 - 27 mg/dL 10  14  14   $ Creatinine 0.57 - 1.00 mg/dL 0.83  0.88  0.84   BUN/Creat Ratio 12 - 28 12   SEE NOTE:   Sodium 134 - 144 mmol/L 143  141  142   Potassium 3.5 - 5.2 mmol/L 3.3  3.7  3.4   Chloride 96 - 106 mmol/L 103  104  104   CO2 20 - 29 mmol/L 24  28  30   $ Calcium 8.7 - 10.3 mg/dL 9.2  9.8  9.4   Mild hypokalemia     Latest Ref Rng & Units 06/16/2022    9:19 AM 04/10/2022    9:13 AM 01/13/2022    9:06 AM  Hepatic Function  Total Protein 6.0 - 8.5 g/dL 6.1  6.5  6.0   Albumin 3.9 - 4.9 g/dL 4.5  4.3    AST 0 - 40 IU/L 28  21  26   $ ALT 0 - 32 IU/L 22  14  14   $ Alk Phosphatase 44 - 121 IU/L 54  42    Total Bilirubin 0.0 - 1.2 mg/dL 0.6  0.6  0.7   Normal LFTs   Assessment & Plan:   Discussion: 67 year old female with sarcoidosis, asthma, osteoporosis, GERD, HLD, HTN who presents for follow-up for sarcoid flare. Has had difficulty tolerating immunosuppressants due to leukopenia on methotrexate and azathioprine. CT imaging with progressive pulmonary sarcoid and labs demonstrating recurrent leukopenia. Discussed options for immunosuppressants and after shared decision making will pursue infliximab. Discussed adverse effects and infusion schedule. Coordinated initiation of medication with pharmacy team.  Pulmonary sarcoidosis, refractory to current immunosuppressants --Dx in 11/20/18 via bronchoscopy --06/19/22 Progressive parenchymal changes. Will need repeat imaging between May-July 2024  History of immunosuppression High risk medication management --QuantiFERON-TB 03/12/19: neg  --Prednisone 11/2018>01/2020 --Methotrexate 03/2019>06/2021. D/C'd due to leukopenia --Prednisone 06/2021>08/2021 --Azathioprine 09/09/21>06/19/22. D/C'd due to leukopenia --STOP azathioprine  --RESTART prednisone 20 mg x 3 weeks, then reduce to 10 mg daily --Plan to enroll in Infliximab --Labs as ordered: Quantiferon TB, Hep B core ab  Sarcoid Monitoring --Recent chest  imaging reviewed. --Annual PFTs.  Last PFTs 03/17/20 --Annual ophthalmology exam.  Last visit on 06/06/21. No active disease. Scheduled for 06/2022 --MR Cardiac 11/28/19. No active  disease --Monitor routine labs as needed: CBC with diff, CMET, 1, 25 and 25 hydroxy vitamin D, urinary calcium  Asthma  --CONTINUE Advair 230-21 TWO puffs TWICE a day --CONTINUE Albuterol TWO puffs AS NEEDED for shortness of breath, chest tightness, cough or wheezing  Health Maintenance Immunization History  Administered Date(s) Administered   Fluad Quad(high Dose 65+) 12/30/2021   Influenza Inj Mdck Quad Pf 02/13/2014, 01/21/2021   Influenza Inj Mdck Quad With Preservative 02/13/2014   Influenza Split 02/13/2013   Influenza, Seasonal, Injecte, Preservative Fre 02/13/2013   Influenza,inj,Quad PF,6+ Mos 12/21/2016, 12/25/2017, 01/02/2019, 01/08/2020, 01/17/2022   Influenza,trivalent, recombinat, inj, PF 02/09/2011, 02/15/2012   PFIZER(Purple Top)SARS-COV-2 Vaccination 07/03/2019, 08/03/2019, 02/26/2020, 01/21/2021   PNEUMOCOCCAL CONJUGATE-20 01/10/2021   Respiratory Syncytial Virus Vaccine,Recomb Aduvanted(Arexvy) 01/26/2022   Tdap 03/16/2009, 01/15/2020   Zoster Recombinat (Shingrix) 11/22/2021, 02/08/2022    Orders Placed This Encounter  Procedures   Hepatitis B Core Antibody, IgM   QuantiFERON-TB Gold Plus   No orders of the defined types were placed in this encounter.   Return in about 3 months (around 09/17/2022).   I have spent a total time of 45-minutes on the day of the appointment including chart review, data review, collecting history, coordinating care and discussing medical diagnosis and plan with the patient/family. Past medical history, allergies, medications were reviewed. Pertinent imaging, labs and tests included in this note have been reviewed and interpreted independently by me.  [place], MD [facility] 06/19/2022 9:54 AM  Office Number [phone]

## 2022-06-19 NOTE — Patient Instructions (Addendum)
Pulmonary sarcoidosis --STOP azathioprine  --RESTART prednisone 20 mg x 3 weeks, then reduce to 10 mg daily --Plan to enroll in Infliximab --Labs as ordered: Quantiferon TB, Hep B core ab  Follow-up with me in 3 months. Please call for questions if appt needed sooner

## 2022-06-19 NOTE — Telephone Encounter (Signed)
[facility] called with a patient report. 1 [phone] opt 1. Reference [id number]

## 2022-06-21 ENCOUNTER — Encounter (HOSPITAL_BASED_OUTPATIENT_CLINIC_OR_DEPARTMENT_OTHER): Payer: Self-pay | Admitting: Pulmonary Disease

## 2022-06-22 ENCOUNTER — Encounter (HOSPITAL_BASED_OUTPATIENT_CLINIC_OR_DEPARTMENT_OTHER): Payer: Self-pay | Admitting: Pulmonary Disease

## 2022-06-22 LAB — QUANTIFERON-TB GOLD PLUS
QuantiFERON Mitogen Value: 6.39 IU/mL
QuantiFERON Nil Value: 0.05 IU/mL
QuantiFERON TB1 Ag Value: 0.04 IU/mL
QuantiFERON TB2 Ag Value: 0.04 IU/mL
QuantiFERON-TB Gold Plus: NEGATIVE

## 2022-06-22 LAB — HEPATITIS B CORE ANTIBODY, IGM: Hep B C IgM: NEGATIVE

## 2022-06-22 NOTE — Telephone Encounter (Signed)
Will forward to Dr. Loanne Drilling as Juluis Rainier. Thanks.

## 2022-06-27 ENCOUNTER — Encounter: Payer: Self-pay | Admitting: Pulmonary Disease

## 2022-06-27 NOTE — Telephone Encounter (Signed)
INFLIXIMAB TREATMENT PLAN for SARCOIDOSIS (managed by Dr. Loanne Drilling):  Referral placed to [facility] for Avsola (infliximab biosimilar preferred by [facility])  Dosing: Initial: IV: 5 mg/kg at weeks 0, 2, and 6 then maintenance of 5 mg/kg every 8 weeks thereafter (agreed upon by Dr. Loanne Drilling via Epic secure chat). If needed can increase frequency to every 4 weeks. Additionally if dose reduction needed, can decrease to '3mg'$ /kg as well  Premedications: acetaminophen '650mg'$  PO and diphenhydramine '25mg'$  PO 30-45 minutes before infusion  Labs: CBC w diff with Week 6 infusion, then every 8 weeks; CMP every 8 weeks; TB gold every 12 months  CBC    Component Value Date/Time   WBC 2.4 (LL) 06/16/2022 0919   WBC 4.0 04/10/2022 0913   RBC 4.43 06/16/2022 0919   RBC 4.48 04/10/2022 0913   HGB 13.2 06/16/2022 0919   HCT 39.1 06/16/2022 0919   PLT 260 06/16/2022 0919   MCV 88 06/16/2022 0919   MCH 29.8 06/16/2022 0919   MCH 31.6 01/13/2022 0906   MCHC 33.8 06/16/2022 0919   MCHC 34.1 04/10/2022 0913   RDW 12.8 06/16/2022 0919   LYMPHSABS 0.7 06/16/2022 0919   MONOABS 0.5 04/10/2022 0913   EOSABS 0.1 06/16/2022 0919   BASOSABS 0.0 06/16/2022 0919     CMP     Component Value Date/Time   NA 143 06/16/2022 0919   K 3.3 (L) 06/16/2022 0919   CL 103 06/16/2022 0919   CO2 24 06/16/2022 0919   GLUCOSE 96 06/16/2022 0919   GLUCOSE 96 04/10/2022 0913   BUN 10 06/16/2022 0919   CREATININE 0.83 06/16/2022 0919   CREATININE 0.84 01/13/2022 0906   CALCIUM 9.2 06/16/2022 0919   PROT 6.1 06/16/2022 0919   ALBUMIN 4.5 06/16/2022 0919   AST 28 06/16/2022 0919   ALT 22 06/16/2022 0919   ALKPHOS 54 06/16/2022 0919   BILITOT 0.6 06/16/2022 0919   GFRNONAA 46 (L) 01/06/2020 0908   GFRAA 53 (L) 01/06/2020 0908      Baseline Immunosuppressant Therapy Labs TB GOLD    Latest Ref Rng & Units 06/19/2022   10:22 AM  Quantiferon TB Gold  Quantiferon TB Gold Plus Negative Negative     Hepatitis Panel    Latest Ref Rng & Units 06/19/2022   10:22 AM  Hepatitis  Hep B IgM Negative Negative        Latest Ref Rng & Units 03/12/2019    9:53 AM  Hepatitis  Hep B Surface Ag NON-REACTI NON-REACTIVE    Hep C Ab NON-REACTI NON-REACTIVE       SPEP    Latest Ref Rng & Units 06/16/2022    9:19 AM  Serum Protein Electrophoresis  Total Protein 6.0 - 8.5 g/dL 6.1    Patient apepars to have been cleared by dermatology to start infliximab infusions  Knox Saliva, PharmD, MPH, BCPS, CPP Clinical Pharmacist (Rheumatology and Pulmonology)

## 2022-06-29 ENCOUNTER — Telehealth: Payer: Self-pay | Admitting: Pharmacy Technician

## 2022-06-29 NOTE — Telephone Encounter (Signed)
Devki Fyi note:  Auth Submission: APPROVED Payer: [employer] medicare Medication & CPT/J Code(s) submitted: Avsola (infliximab-axxq) [id number] Route of submission (phone, fax, portal):  Phone [phone] Fax [phone] Auth type: Buy/Bill Units/visits requested: 7 Reference number: [id number] Approval from: 06/28/22 to 06/29/23

## 2022-07-03 NOTE — Telephone Encounter (Addendum)
Avsola infusion scheduled for 07/12/22  Knox Saliva, PharmD, MPH, BCPS, CPP Clinical Pharmacist (Rheumatology and Pulmonology)

## 2022-07-04 NOTE — Telephone Encounter (Signed)
Recent message sent by pt:  Tamara Myers  P [facility] Clinical Pool (supporting Chi Rodman Pickle, MD)1 hour ago (7:41 AM)    I am scheduled for the 1st influximab infusion for next Wed (3/13). Should I taper off the prednisone once I have had the infusion? I will be at '10mg'$  per day by then.   Thank you!    Dr. Loanne Drilling, please advise.

## 2022-07-12 ENCOUNTER — Ambulatory Visit (INDEPENDENT_AMBULATORY_CARE_PROVIDER_SITE_OTHER): Payer: Medicare Other

## 2022-07-12 VITALS — BP 130/80 | HR 68 | Temp 98.4°F | Resp 18 | Ht 64.0 in | Wt 125.2 lb

## 2022-07-12 DIAGNOSIS — D869 Sarcoidosis, unspecified: Secondary | ICD-10-CM | POA: Diagnosis not present

## 2022-07-12 DIAGNOSIS — M810 Age-related osteoporosis without current pathological fracture: Secondary | ICD-10-CM

## 2022-07-12 DIAGNOSIS — Z79899 Other long term (current) drug therapy: Secondary | ICD-10-CM

## 2022-07-12 MED ORDER — DIPHENHYDRAMINE HCL 25 MG PO CAPS
25.0000 mg | ORAL_CAPSULE | Freq: Once | ORAL | Status: AC
  Administered 2022-07-12: 25 mg via ORAL
  Filled 2022-07-12: qty 1

## 2022-07-12 MED ORDER — SODIUM CHLORIDE 0.9 % IV SOLN
5.0000 mg/kg | Freq: Once | INTRAVENOUS | Status: AC
  Administered 2022-07-12: 300 mg via INTRAVENOUS
  Filled 2022-07-12: qty 30

## 2022-07-12 MED ORDER — ACETAMINOPHEN 325 MG PO TABS: 650.0000 mg | ORAL_TABLET | Freq: Once | ORAL | Status: DC

## 2022-07-12 MED ORDER — ZOLEDRONIC ACID 5 MG/100ML IV SOLN: 5.0000 mg | Freq: Once | INTRAVENOUS | Status: DC

## 2022-07-12 MED ORDER — DIPHENHYDRAMINE HCL 25 MG PO CAPS: 25.0000 mg | ORAL_CAPSULE | Freq: Once | ORAL | Status: DC

## 2022-07-12 MED ORDER — SODIUM CHLORIDE 0.9 % IV SOLN: INTRAVENOUS | Status: DC

## 2022-07-12 MED ORDER — ACETAMINOPHEN 325 MG PO TABS
650.0000 mg | ORAL_TABLET | Freq: Once | ORAL | Status: AC
  Administered 2022-07-12: 650 mg via ORAL
  Filled 2022-07-12: qty 2

## 2022-07-12 NOTE — Patient Instructions (Signed)
Infliximab Injection What is this medication? INFLIXIMAB (in FLIX i mab) treats autoimmune conditions, such as psoriasis, arthritis, Crohn's disease, and ulcerative colitis. It works by slowing down an overactive immune system. It belongs to a group of medications called TNF inhibitors. It is a monoclonal antibody. This medicine may be used for other purposes; ask your health care provider or pharmacist if you have questions. COMMON BRAND NAME(S): AVSOLA, INFLECTRA, IXIFI, Remicade, RENFLEXIS What should I tell my care team before I take this medication? They need to know if you have any of these conditions: Cancer Current or past resident of [place] or [place] Diabetes Exposure to tuberculosis Guillain-Barre syndrome Heart failure Liver disease Immune system problems Infection Lung or breathing disease, such as COPD Multiple sclerosis Receiving phototherapy for the skin Seizure disorder An unusual or allergic reaction to infliximab, mouse proteins, other medications, foods, dyes, or preservatives Pregnant or trying to get pregnant Breast-feeding How should I use this medication? This medication is injected into a vein. It is usually given by a care team in a hospital or clinic setting. A special MedGuide will be given to you by the pharmacist with each prescription and refill. Be sure to read this information carefully each time. Talk to your care team about the use of this medication in children. While it may be prescribed for children as young as 6 years of age for selected conditions, precautions do apply. Overdosage: If you think you have taken too much of this medicine contact a poison control center or emergency room at once. NOTE: This medicine is only for you. Do not share this medicine with others. What if I miss a dose? Keep appointments for follow-up doses. It is important not to miss your dose. Call your care team if you are unable to keep an  appointment. What may interact with this medication? Do not take this medication with any of the following: Biologic medications, such as abatacept, adalimumab, anakinra, certolizumab, etanercept, golimumab, rituximab, secukinumab, tocilizumab, tofactinib, ustekinumab Live vaccines This list may not describe all possible interactions. Give your health care provider a list of all the medicines, herbs, non-prescription drugs, or dietary supplements you use. Also tell them if you smoke, drink alcohol, or use illegal drugs. Some items may interact with your medicine. What should I watch for while using this medication? Visit your care team for regular checks on your progress. Your condition will be monitored carefully while you are receiving this medication. You may need blood work done while you are taking this medication. You will be tested for tuberculosis (TB) before you start this medication. If your care team prescribes any medication for TB, you should start taking the TB medication before starting this medication. Make sure to finish the full course of TB medication. This medication may increase your risk of getting an infection. Call your care team for advice if you get a fever, chills, sore throat, or other symptoms of a cold or flu. Do not treat yourself. Try to avoid being around people who are sick. This medication may make the symptoms of heart failure worse in some patients. Contact your care team right away if you develop signs or symptoms of heart failure. Before having surgery or dental work, talk to your care team to make sure it is ok. This medication can increase the risk of poor healing of your surgical site or wound. If you take this medication for plaque psoriasis, stay out of the sun. If you cannot avoid being   in the sun, wear protective clothing and sunscreen. Do not use sun lamps or tanning beds/booths. Talk to your care team about your risk of cancer. You may be more at risk for  certain types of cancers if you take this medication. What side effects may I notice from receiving this medication? Side effects that you should report to your care team as soon as possible: Allergic reactions--skin rash, itching, hives, swelling of the face, lips, tongue, or throat Body pain, tingling, or numbness Heart attack--pain or tightness in the chest, shoulders, arms, or jaw, nausea, shortness of breath, cold or clammy skin, feeling faint or lightheaded Heart failure--shortness of breath, swelling of the ankles, feet, or hands, sudden weight gain, unusual weakness or fatigue Heart rhythm changes--fast or irregular heartbeat, dizziness, feeling faint or lightheaded, chest pain, trouble breathing Increase in blood pressure Infection--fever, chills, cough, sore throat, wounds that don't heal, pain or trouble when passing urine, general feeling of discomfort or being unwell Liver injury--right upper belly pain, loss of appetite, nausea, light-colored stool, dark yellow or brown urine, yellowing skin or eyes, unusual weakness or fatigue Low blood pressure--dizziness, feeling faint or lightheaded, blurry vision Lupus-like syndrome--joint pain, swelling, or stiffness, butterfly-shaped rash on the face, rashes that get worse in the sun, fever, unusual weakness or fatigue Seizures Stroke--sudden numbness or weakness of the face, arm, or leg, trouble speaking, confusion, trouble walking, loss of balance or coordination, dizziness, severe headache, change in vision Sudden vision loss in one or both eyes Unusual bruising or bleeding Side effects that usually do not require medical attention (report to your care team if they continue or are bothersome): Cough Diarrhea Fatigue Headache Nausea Runny or stuffy nose Sore throat Stomach pain This list may not describe all possible side effects. Call your doctor for medical advice about side effects. You may report side effects to FDA at  1-800-FDA-1088. Where should I keep my medication? This medication is given in a hospital or clinic. It will not be stored at home. NOTE: This sheet is a summary. It may not cover all possible information. If you have questions about this medicine, talk to your doctor, pharmacist, or health care provider.  2023 Elsevier/Gold Standard (2021-06-30 00:00:00)  

## 2022-07-12 NOTE — Progress Notes (Signed)
Diagnosis: Sarcoidosis  Provider:  Marshell Garfinkel MD  Procedure: Infusion  IV Type: Peripheral, IV Location: L Forearm  Avsola (infliximab-axxq), Dose: 300 mg  Infusion Start Time: [id number]  Infusion Stop Time: 1227  Post Infusion IV Care: Observation period completed and Peripheral IV Discontinued  Discharge: Condition: Good, Destination: Home . AVS Provided  Performed by:  Arnoldo Morale, RN

## 2022-07-21 ENCOUNTER — Other Ambulatory Visit: Payer: Self-pay | Admitting: Internal Medicine

## 2022-07-26 ENCOUNTER — Ambulatory Visit (INDEPENDENT_AMBULATORY_CARE_PROVIDER_SITE_OTHER): Payer: Medicare Other

## 2022-07-26 VITALS — BP 109/70 | HR 76 | Temp 98.1°F | Resp 16 | Ht 64.0 in | Wt 126.4 lb

## 2022-07-26 DIAGNOSIS — D869 Sarcoidosis, unspecified: Secondary | ICD-10-CM | POA: Diagnosis not present

## 2022-07-26 DIAGNOSIS — Z79899 Other long term (current) drug therapy: Secondary | ICD-10-CM

## 2022-07-26 MED ORDER — ACETAMINOPHEN 325 MG PO TABS
650.0000 mg | ORAL_TABLET | Freq: Once | ORAL | Status: AC
  Administered 2022-07-26: 650 mg via ORAL
  Filled 2022-07-26: qty 2

## 2022-07-26 MED ORDER — SODIUM CHLORIDE 0.9 % IV SOLN
5.0000 mg/kg | Freq: Once | INTRAVENOUS | Status: AC
  Administered 2022-07-26: 300 mg via INTRAVENOUS
  Filled 2022-07-26: qty 30

## 2022-07-26 MED ORDER — DIPHENHYDRAMINE HCL 25 MG PO CAPS
25.0000 mg | ORAL_CAPSULE | Freq: Once | ORAL | Status: AC
  Administered 2022-07-26: 25 mg via ORAL
  Filled 2022-07-26: qty 1

## 2022-07-26 NOTE — Progress Notes (Signed)
Diagnosis: Crohn's Disease  Provider:  Marshell Garfinkel MD  Procedure: Infusion  IV Type: Peripheral, IV Location: R Forearm  Avsola (infliximab-axxq), Dose: 300 mg  Infusion Start Time: 1109  Infusion Stop Time: [id number]  Post Infusion IV Care: Peripheral IV Discontinued  Discharge: Condition: Good, Destination: Home . AVS Declined  Performed by:  Adelina Mings, LPN

## 2022-07-28 ENCOUNTER — Ambulatory Visit (INDEPENDENT_AMBULATORY_CARE_PROVIDER_SITE_OTHER): Payer: Medicare Other | Admitting: *Deleted

## 2022-07-28 VITALS — BP 125/72 | HR 63 | Temp 98.0°F | Resp 18 | Ht 64.0 in | Wt 127.8 lb

## 2022-07-28 DIAGNOSIS — M810 Age-related osteoporosis without current pathological fracture: Secondary | ICD-10-CM

## 2022-07-28 MED ORDER — ACETAMINOPHEN 325 MG PO TABS
650.0000 mg | ORAL_TABLET | Freq: Once | ORAL | Status: AC
  Administered 2022-07-28: 650 mg via ORAL
  Filled 2022-07-28: qty 2

## 2022-07-28 MED ORDER — ZOLEDRONIC ACID 5 MG/100ML IV SOLN
5.0000 mg | Freq: Once | INTRAVENOUS | Status: AC
  Administered 2022-07-28: 5 mg via INTRAVENOUS
  Filled 2022-07-28: qty 100

## 2022-07-28 MED ORDER — DIPHENHYDRAMINE HCL 25 MG PO CAPS
25.0000 mg | ORAL_CAPSULE | Freq: Once | ORAL | Status: AC
  Administered 2022-07-28: 25 mg via ORAL
  Filled 2022-07-28: qty 1

## 2022-07-28 NOTE — Progress Notes (Signed)
Diagnosis: Osteoporosis  Provider:  Marshell Garfinkel MD  Procedure: Infusion  IV Type: Peripheral, IV Location: L Antecubital  Reclast (Zolendronic Acid), Dose: 5 mg  Infusion Start Time: 0933 am  Infusion Stop Time: 1003 am  Post Infusion IV Care: Observation period completed and Peripheral IV Discontinued  Discharge: Condition: Good, Destination: Home . AVS Provided and AVS Declined  Performed by:  Oren Beckmann, RN

## 2022-07-31 ENCOUNTER — Telehealth: Payer: Self-pay | Admitting: Pharmacy Technician

## 2022-07-31 NOTE — Telephone Encounter (Signed)
Dr. Kelton Pillar, Juluis Rainier note:  Auth Submission: NO AUTH NEEDED Site of care: Site of care: [facility] Payer: [employer] MEDICARE Medication & CPT/J Code(s) submitted: Reclast (Zolendronic acid) [id number] Route of submission (phone, fax, portal):  Phone # Fax # Auth type: Buy/Bill Units/visits requested: 1 Reference number:  Approval from: 07/31/22 to 05/01/23    Patient will be scheduled as soon as possible

## 2022-08-16 DIAGNOSIS — K080 Exfoliation of teeth due to systemic causes: Secondary | ICD-10-CM | POA: Diagnosis not present

## 2022-08-23 ENCOUNTER — Other Ambulatory Visit (INDEPENDENT_AMBULATORY_CARE_PROVIDER_SITE_OTHER): Payer: Medicare Other

## 2022-08-23 ENCOUNTER — Ambulatory Visit (INDEPENDENT_AMBULATORY_CARE_PROVIDER_SITE_OTHER): Payer: Medicare Other

## 2022-08-23 ENCOUNTER — Other Ambulatory Visit: Payer: Self-pay

## 2022-08-23 VITALS — BP 122/79 | HR 74 | Temp 98.3°F | Resp 16 | Ht 64.0 in | Wt 127.0 lb

## 2022-08-23 DIAGNOSIS — D869 Sarcoidosis, unspecified: Secondary | ICD-10-CM

## 2022-08-23 DIAGNOSIS — Z79899 Other long term (current) drug therapy: Secondary | ICD-10-CM

## 2022-08-23 LAB — CBC WITH DIFFERENTIAL/PLATELET
Basophils Absolute: 0 10*3/uL (ref 0.0–0.1)
Basophils Relative: 0.6 % (ref 0.0–3.0)
Eosinophils Absolute: 0 10*3/uL (ref 0.0–0.7)
Eosinophils Relative: 0.6 % (ref 0.0–5.0)
HCT: 40.5 % (ref 36.0–46.0)
Hemoglobin: 14 g/dL (ref 12.0–15.0)
Lymphocytes Relative: 20.5 % (ref 12.0–46.0)
Lymphs Abs: 0.9 10*3/uL (ref 0.7–4.0)
MCHC: 34.5 g/dL (ref 30.0–36.0)
MCV: 89 fl (ref 78.0–100.0)
Monocytes Absolute: 0.6 10*3/uL (ref 0.1–1.0)
Monocytes Relative: 13.6 % — ABNORMAL HIGH (ref 3.0–12.0)
Neutro Abs: 2.7 10*3/uL (ref 1.4–7.7)
Neutrophils Relative %: 64.7 % (ref 43.0–77.0)
Platelets: 234 10*3/uL (ref 150.0–400.0)
RBC: 4.55 Mil/uL (ref 3.87–5.11)
RDW: 13.9 % (ref 11.5–15.5)
WBC: 4.2 10*3/uL (ref 4.0–10.5)

## 2022-08-23 LAB — COMPREHENSIVE METABOLIC PANEL
ALT: 21 U/L (ref 0–35)
AST: 29 U/L (ref 0–37)
Albumin: 4.4 g/dL (ref 3.5–5.2)
Alkaline Phosphatase: 37 U/L — ABNORMAL LOW (ref 39–117)
BUN: 12 mg/dL (ref 6–23)
CO2: 29 mEq/L (ref 19–32)
Calcium: 9.6 mg/dL (ref 8.4–10.5)
Chloride: 104 mEq/L (ref 96–112)
Creatinine, Ser: 0.91 mg/dL (ref 0.40–1.20)
GFR: 65.81 mL/min (ref >60.00)
Glucose, Bld: 91 mg/dL (ref 70–99)
Potassium: 3.5 mEq/L (ref 3.5–5.1)
Sodium: 142 mEq/L (ref 135–145)
Total Bilirubin: 0.9 mg/dL (ref 0.2–1.2)
Total Protein: 6.5 g/dL (ref 6.0–8.3)

## 2022-08-23 MED ORDER — ACETAMINOPHEN 325 MG PO TABS
650.0000 mg | ORAL_TABLET | Freq: Once | ORAL | Status: AC
  Administered 2022-08-23: 650 mg via ORAL
  Filled 2022-08-23: qty 2

## 2022-08-23 MED ORDER — SODIUM CHLORIDE 0.9 % IV SOLN
5.0000 mg/kg | Freq: Once | INTRAVENOUS | Status: AC
  Administered 2022-08-23: 300 mg via INTRAVENOUS
  Filled 2022-08-23: qty 30

## 2022-08-23 MED ORDER — DIPHENHYDRAMINE HCL 25 MG PO CAPS
25.0000 mg | ORAL_CAPSULE | Freq: Once | ORAL | Status: AC
  Administered 2022-08-23: 25 mg via ORAL
  Filled 2022-08-23: qty 1

## 2022-08-23 NOTE — Progress Notes (Signed)
Diagnosis: Crohn's Disease  Provider:  Chilton Greathouse MD  Procedure: IV Infusion  IV Type: Peripheral, IV Location: R Forearm  Avsola (infliximab-axxq), Dose: 300 mg  Infusion Start Time: 0924  Infusion Stop Time: 1139  Post Infusion IV Care: Peripheral IV Discontinued  Discharge: Condition: Good, Destination: Home . AVS Declined  Performed by:  Loney Hering, LPN

## 2022-09-20 ENCOUNTER — Encounter (HOSPITAL_BASED_OUTPATIENT_CLINIC_OR_DEPARTMENT_OTHER): Payer: Self-pay | Admitting: Pulmonary Disease

## 2022-09-20 ENCOUNTER — Ambulatory Visit (HOSPITAL_BASED_OUTPATIENT_CLINIC_OR_DEPARTMENT_OTHER): Payer: Medicare Other | Admitting: Pulmonary Disease

## 2022-09-20 VITALS — BP 124/76 | HR 86 | Temp 97.8°F | Ht 64.0 in | Wt 126.4 lb

## 2022-09-20 DIAGNOSIS — D869 Sarcoidosis, unspecified: Secondary | ICD-10-CM

## 2022-09-20 MED ORDER — FLUTICASONE-SALMETEROL 230-21 MCG/ACT IN AERO: 2.0000 | INHALATION_SPRAY | Freq: Two times a day (BID) | RESPIRATORY_TRACT | 5 refills | Status: DC

## 2022-09-20 NOTE — Patient Instructions (Addendum)
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  --Started infliximab 07/12/22 --ORDER CT Chest without contrast for 03/2023  Asthma  --CONTINUE Advair 230-21 TWO puffs TWICE a day. Ok to trial decreasing dose --CONTINUE Albuterol TWO puffs AS NEEDED for shortness of breath, chest tightness, cough or wheezing

## 2022-09-20 NOTE — Progress Notes (Signed)
Subjective:   PATIENT ID: Tamara Myers GENDER: female DOB: November 30, 1955, MRN: [id number]   HPI  Chief Complaint  Patient presents with   Follow-up    Follow up. Patient states her cough is getting better.    Reason for Visit: Follow-up sarcoid  [name] is a 67 year old female with sarcoidosis, asthma, osteoporosis, GERD, HLD, HTN who presents for follow-up  Synopsis: She initially was seen by Dr. Tonia Brooms in 2020 with symptoms of progressive cough and CT demonstrating perilymphatic micro-nodularity and bilateral hilar adenopathy including 1.4 cm hilar lymph node and 1cm RLL nodule consistent with sarcoid. Bronchoscopy on 11/20/18 with granulomatous inflammation. Started on prednisone in August 2020 with clinical improvement and improvement of parenchymal changes and nodularity. Started on methotrexate 10 mg and steroids November 2020. Continue steroids until 01/2020 and maintained only methotrexate with stable respiratory symptoms. Attempted methotrexate taper in fall 2022 however symptoms recurred. Has been controlled on methotrexate on low dosage 5-7.5. Had to discontinue in March 2023 due to leukopenia. Had to be restarted on prednisone. Started on azathioprine May 2023 and currently on reduced dosing due to leukopenia.  04/12/22 Since our last visit she reports improved symptoms however did develop a cough in the last three weeks. This may have been preceded by cold but not sure if it is related. Using albuterol once a week and advair as directed. Cough is sometimes productive. No wheezing. Compliant with azathioprine. Denies side effects (nausea, rash, hair loss, discoloration of urine or tears).  06/01/22 She has continued to have productive cough that has now progressed to green sputum to whitish sputum. Increased frequency. Denies fevers/chills. Compliant with azathioprine. Denies side effects (nausea, rash, hair loss, discoloration of urine or tears)  06/19/22 Since our last  visit she completed her antibiotic. Cough has improved. Denies shortness of breath or wheezing. She has been compliant with low-dose azathioprine but noticed her labs demonstrated leukopenia again.  09/20/22 Since our last visit she was started on Infliximab 07/12/22. Tolerating well. Cough has improved and no longer nocturnal cough. Compliant with Advair. Denies shortness of breath or wheezing.  Social History: Audiological scientist for [employer] Father passed from pulmonary fibrosis  Past Medical History:  Diagnosis Date   Cancer (HCC)    basal cell carcinoma   Cataract    bilateral   Complication of anesthesia    slow to wake up   Dyspnea    on exertion   GERD (gastroesophageal reflux disease)    on meds   Hyperlipidemia    on meds   Hypertension    on meds   Melanoma (HCC) 2018   left upper arm   Moderate persistent asthma without complication 04/04/2018   on meds   Osteoporosis    confirmed by bone scan   PONV (postoperative nausea and vomiting)    Post menopausal syndrome    Pulmonary sarcoidosis (HCC)    tx'd     Family History  Problem Relation Age of Onset   Osteoporosis Mother    Diabetes Father    Heart disease Father    Hyperlipidemia Father    Hypertension Father    Pulmonary fibrosis Father    Asthma Daughter    Breast cancer Neg Hx    Allergic rhinitis Neg Hx    Eczema Neg Hx    Urticaria Neg Hx    Angioedema Neg Hx    Colon polyps Neg Hx    Colon cancer Neg Hx  Esophageal cancer Neg Hx    Rectal cancer Neg Hx    Stomach cancer Neg Hx      Social History   Occupational History   Not on file  Tobacco Use   Smoking status: Never   Smokeless tobacco: Never  Vaping Use   Vaping Use: Never used  Substance and Sexual Activity   Alcohol use: No   Drug use: No   Sexual activity: Not on file    No Known Allergies   Outpatient Medications Prior to Visit  Medication Sig Dispense Refill   albuterol (VENTOLIN HFA) 108 (90 Base)  MCG/ACT inhaler Inhale 2 puffs into the lungs every 6 (six) hours as needed for wheezing or shortness of breath. 18 g 1   amLODipine (NORVASC) 5 MG tablet TAKE 1 TABLET BY MOUTH DAILY 90 tablet 1   azaTHIOprine (IMURAN) 50 MG tablet Take 0.5 tablets (25 mg total) by mouth daily. 30 tablet 2   inFLIXimab-axxq (AVSOLA IV) Inject into the vein. Infuse 5 mg/kg at weeks 0, 2, and 6 then maintenance of 5 mg/kg every 8 weeks thereafter (receives at [facility] infusion center)     pantoprazole (PROTONIX) 40 MG tablet TAKE 1 TABLET BY MOUTH DAILY 90 tablet 3   potassium chloride SA (KLOR-CON M) 20 MEQ tablet Take 1 tablet (20 mEq total) by mouth daily. 90 tablet 3   rosuvastatin (CRESTOR) 10 MG tablet TAKE ONE TABLET BY MOUTH DAILY AT 6PM 90 tablet 3   triamterene-hydrochlorothiazide (MAXZIDE-25) 37.5-25 MG tablet TAKE 1 TABLET BY MOUTH DAILY 90 tablet 1   azithromycin (ZITHROMAX) 250 MG tablet Take two tablets on day 1, then one tablet daily on day 2-5. 6 tablet 0   fluticasone-salmeterol (ADVAIR HFA) 230-21 MCG/ACT inhaler Inhale 2 puffs into the lungs 2 (two) times daily. 1 each 5   nystatin (MYCOSTATIN) 100000 UNIT/ML suspension Take 5 mLs by mouth 4 (four) times daily. (Patient not taking: Reported on 06/01/2022)     No facility-administered medications prior to visit.    Review of Systems  Constitutional:  Negative for chills, diaphoresis, fever, malaise/fatigue and weight loss.  HENT:  Negative for congestion.   Respiratory:  Positive for cough. Negative for hemoptysis, sputum production, shortness of breath and wheezing.   Cardiovascular:  Negative for chest pain, palpitations and leg swelling.     Objective:   Vitals:   09/20/22 0916  BP: 124/76  Pulse: 86  Temp: 97.8 F (36.6 C)  TempSrc: Oral  SpO2: 98%  Weight: 57.3 kg  Height: 5\' 4"  (1.626 m)    SpO2: 98 % O2 Device: None (Room air)  Body mass index is 21.7 kg/m.  Physical Exam: General: Well-appearing, no acute  distress HENT: [place], AT Eyes: EOMI, no scleral icterus Respiratory: Clear to auscultation bilaterally.  No crackles, wheezing or rales Cardiovascular: RRR, -M/R/G, no JVD Extremities:-Edema,-tenderness Neuro: AAO x4, CNII-XII grossly intact Psych: Normal mood, normal affect  Data Reviewed:  Imaging:  CT Chest 07/02/18 - Bilateral nodularity with perilymphatic distribution including 1cm RLL nodule. Right hilar enlargement.  CT Chest 10/18/18 - Progressive bilateral nodularity including RLL nodule ~30mm  CT Chest 03/10/19 - Improved parenchymal changes with mild disease in RML, lingular and superior LLL  CT Chest HR 06/30/19 - Improved parenchymal changes in setting of immunosuppression. RLL 5 mm subpleural nodule.  CT Chest HR 07/15/20 - No significant parenchymal abnormalities or consolidations seen. Stable calcified mediastinal and bilateral hilar nodes. New 2mm solid RUL nodule likely benign. Stable  peripheral RUL GGO stable and RLL 5mm nodule stable since 06/2019.  CT Chest 06/10/22 - Progressive pulmonary sarcoid with several new scattered irregular nodules including 10 mm LLL nodule and new GGO in RLL and RML, new subpleural consolidation in superior LLL  PFT: 01/23/18 FVC 2.53 (77%) FEV1 2.19 (86%) Ratio 87% Interpretation: No obstructive lung disease. Reduced FVC and FEV1  04/04/18 FVC 2.59 (78%) FEV1 1.93 (76%) Ratio 75% Interpretation: No obstructive lung disease. Reduced FVC and FEV1.  05/09/18 FVC 2.43 (73%) FEV1 1.6 (63%) Ratio 66% Interpretation: Moderate obstructive lung disease  11/06/18 FVC 2.92 (89%) FEV1 2.04 (74%) Ratio 74  (Pred 78) TLC 93% DLCO 100%. F-V loops suggestive of small airway disease Interpretation: Mild obstructive defect. No significant BD  03/17/20 FVC 2.53 (79%) FEV1 1.96 (80%) Ratio 65  TLC 87% DLCO 101%. Significant BD response in FEV1 Interpretation: Mild obstructive defect with significant bronchodilator response     Latest Ref Rng & Units  08/23/2022    8:48 AM 06/16/2022    9:19 AM 04/10/2022    9:13 AM  CBC  WBC 4.0 - 10.5 K/uL 4.2  2.4  4.0   Hemoglobin 12.0 - 15.0 g/dL 16.1  09.6  04.5   Hematocrit 36.0 - 46.0 % 40.5  39.1  39.3   Platelets 150.0 - 400.0 K/uL 234.0  260  311.0   Resolved leukopenia on infliximab      Latest Ref Rng & Units 08/23/2022    8:48 AM 06/16/2022    9:19 AM 04/10/2022    9:13 AM  BMP  Glucose 70 - 99 mg/dL 91  96  96   BUN 6 - 23 mg/dL 12  10  14    Creatinine 0.40 - 1.20 mg/dL 4.09  8.11  9.14   BUN/Creat Ratio 12 - 28  12    Sodium 135 - 145 mEq/L 142  143  141   Potassium 3.5 - 5.1 mEq/L 3.5  3.3  3.7   Chloride 96 - 112 mEq/L 104  103  104   CO2 19 - 32 mEq/L 29  24  28    Calcium 8.4 - 10.5 mg/dL 9.6  9.2  9.8   Resolved hypokalemia off prednisone     Latest Ref Rng & Units 08/23/2022    8:48 AM 06/16/2022    9:19 AM 04/10/2022    9:13 AM  Hepatic Function  Total Protein 6.0 - 8.3 g/dL 6.5  6.1  6.5   Albumin 3.5 - 5.2 g/dL 4.4  4.5  4.3   AST 0 - 37 U/L 29  28  21    ALT 0 - 35 U/L 21  22  14    Alk Phosphatase 39 - 117 U/L 37  54  42   Total Bilirubin 0.2 - 1.2 mg/dL 0.9  0.6  0.6   Normal LFTs   Assessment & Plan:   Discussion: 67 year old female with sarcoidosis, asthma, osteoporosis, GERD, HLD, HTN who presents for follow-up for sarcoid flare.  Has had difficulty tolerating immunosuppressants due to leukopenia on methotrexate and azathioprine.  CT imaging from February 2024 with progressive pulmonary sarcoid so she was transitioned to infliximab.  Currently tolerating well without issue and near resolution of cough.  If she were to fail this regimen would consider adding low-dose methotrexate and monitoring for signs of leukopenia.  Pulmonary sarcoidosis, refractory to current immunosuppressants -improved symptom control --Dx in 11/20/18 via bronchoscopy --06/19/22 Progressive parenchymal changes.  --ORDER CT Chest without contrast for 03/2023 for  immunosuppressantresponse  History of immunosuppression High risk medication management --QuantiFERON-TB 03/12/19: neg  --Prednisone 11/2018>01/2020 --Methotrexate 03/2019>06/2021. D/C'd due to leukopenia --Prednisone 06/2021>08/2021 --Azathioprine 09/09/21>06/19/22. D/C'd due to leukopenia --Started infliximab 07/12/22 --Labs reviewed. WBC and potassium normalized.  Sarcoid Monitoring --Recent chest imaging reviewed. --Annual PFTs.  Last PFTs 03/17/20 --Annual ophthalmology exam.  Last visit on 06/06/21. No active disease. Scheduled for 06/2022 --MR Cardiac 11/28/19. No active disease --Monitor routine labs as needed: CBC with diff, CMET, 1, 25 and 25 hydroxy vitamin D, urinary calcium  Asthma  --CONTINUE Advair 230-21 TWO puffs TWICE a day. Ok to trial decreasing dose --CONTINUE Albuterol TWO puffs AS NEEDED for shortness of breath, chest tightness, cough or wheezing  Health Maintenance Immunization History  Administered Date(s) Administered   Fluad Quad(high Dose 65+) 12/30/2021   Influenza Inj Mdck Quad Pf 02/13/2014, 01/21/2021   Influenza Inj Mdck Quad With Preservative 02/13/2014   Influenza Split 02/13/2013   Influenza, Seasonal, Injecte, Preservative Fre 02/13/2013   Influenza,inj,Quad PF,6+ Mos 12/21/2016, 12/25/2017, 01/02/2019, 01/08/2020, 01/17/2022   Influenza,trivalent, recombinat, inj, PF 02/09/2011, 02/15/2012   Influenza-Unspecified 02/13/2013   PFIZER(Purple Top)SARS-COV-2 Vaccination 07/03/2019, 08/03/2019, 02/26/2020, 01/21/2021   PNEUMOCOCCAL CONJUGATE-20 01/10/2021   Respiratory Syncytial Virus Vaccine,Recomb Aduvanted(Arexvy) 01/26/2022   Tdap 03/16/2009, 01/15/2020   Zoster Recombinat (Shingrix) 11/22/2021, 02/08/2022    Orders Placed This Encounter  Procedures   CT Chest Wo Contrast    Standing Status:   Future    Standing Expiration Date:   09/20/2023    Scheduling Instructions:     Schedule in 03/2023. Need appointment after CT scan with Dr. Everardo All     Order Specific Question:   Preferred imaging location?    Answer:   [facility]   Meds ordered this encounter  Medications   fluticasone-salmeterol (ADVAIR HFA) 230-21 MCG/ACT inhaler    Sig: Inhale 2 puffs into the lungs 2 (two) times daily.    Dispense:  1 each    Refill:  5    Return in about 6 months (around 03/23/2023) for after CT scan.   I have spent a total time of 35-minutes on the day of the appointment including chart review, data review, collecting history, coordinating care and discussing medical diagnosis and plan with the patient/family. Past medical history, allergies, medications were reviewed. Pertinent imaging, labs and tests included in this note have been reviewed and interpreted independently by me.  Eddis Pingleton Mechele Collin, MD [facility] 09/20/2022 12:39 PM  Office Number [phone]

## 2022-09-20 NOTE — Progress Notes (Signed)
I believe labs were placed under Dr. Shirlee More name by infusion center (not sure why). I have sent email to Teofilo Pod with infusion center for assistance.  Chesley Mires, PharmD, MPH, BCPS, CPP Clinical Pharmacist (Rheumatology and Pulmonology)

## 2022-10-10 DIAGNOSIS — C44619 Basal cell carcinoma of skin of left upper limb, including shoulder: Secondary | ICD-10-CM | POA: Diagnosis not present

## 2022-10-10 DIAGNOSIS — L821 Other seborrheic keratosis: Secondary | ICD-10-CM | POA: Diagnosis not present

## 2022-10-10 DIAGNOSIS — C44712 Basal cell carcinoma of skin of right lower limb, including hip: Secondary | ICD-10-CM | POA: Diagnosis not present

## 2022-10-10 DIAGNOSIS — Z85828 Personal history of other malignant neoplasm of skin: Secondary | ICD-10-CM | POA: Diagnosis not present

## 2022-10-10 DIAGNOSIS — D485 Neoplasm of uncertain behavior of skin: Secondary | ICD-10-CM | POA: Diagnosis not present

## 2022-10-10 DIAGNOSIS — L570 Actinic keratosis: Secondary | ICD-10-CM | POA: Diagnosis not present

## 2022-10-10 DIAGNOSIS — C44719 Basal cell carcinoma of skin of left lower limb, including hip: Secondary | ICD-10-CM | POA: Diagnosis not present

## 2022-10-10 DIAGNOSIS — D2262 Melanocytic nevi of left upper limb, including shoulder: Secondary | ICD-10-CM | POA: Diagnosis not present

## 2022-10-25 ENCOUNTER — Other Ambulatory Visit (INDEPENDENT_AMBULATORY_CARE_PROVIDER_SITE_OTHER): Payer: Medicare Other

## 2022-10-25 ENCOUNTER — Encounter (HOSPITAL_BASED_OUTPATIENT_CLINIC_OR_DEPARTMENT_OTHER): Payer: Self-pay | Admitting: Pulmonary Disease

## 2022-10-25 ENCOUNTER — Ambulatory Visit (INDEPENDENT_AMBULATORY_CARE_PROVIDER_SITE_OTHER): Payer: Medicare Other

## 2022-10-25 VITALS — BP 127/77 | HR 69 | Temp 98.4°F | Resp 16 | Ht 64.0 in | Wt 124.4 lb

## 2022-10-25 DIAGNOSIS — D869 Sarcoidosis, unspecified: Secondary | ICD-10-CM

## 2022-10-25 DIAGNOSIS — Z79899 Other long term (current) drug therapy: Secondary | ICD-10-CM

## 2022-10-25 LAB — CBC WITH DIFFERENTIAL/PLATELET
Basophils Absolute: 0 10*3/uL (ref 0.0–0.1)
Basophils Relative: 0.4 % (ref 0.0–3.0)
Eosinophils Absolute: 0.1 10*3/uL (ref 0.0–0.7)
Eosinophils Relative: 2.1 % (ref 0.0–5.0)
HCT: 40.7 % (ref 36.0–46.0)
Hemoglobin: 13.4 g/dL (ref 12.0–15.0)
Lymphocytes Relative: 24.3 % (ref 12.0–46.0)
Lymphs Abs: 0.9 10*3/uL (ref 0.7–4.0)
MCHC: 32.9 g/dL (ref 30.0–36.0)
MCV: 91.8 fl (ref 78.0–100.0)
Monocytes Absolute: 0.5 10*3/uL (ref 0.1–1.0)
Monocytes Relative: 12.9 % — ABNORMAL HIGH (ref 3.0–12.0)
Neutro Abs: 2.2 10*3/uL (ref 1.4–7.7)
Neutrophils Relative %: 60.3 % (ref 43.0–77.0)
Platelets: 231 10*3/uL (ref 150.0–400.0)
RBC: 4.44 Mil/uL (ref 3.87–5.11)
RDW: 12.6 % (ref 11.5–15.5)
WBC: 3.6 10*3/uL — ABNORMAL LOW (ref 4.0–10.5)

## 2022-10-25 LAB — COMPREHENSIVE METABOLIC PANEL
ALT: 20 U/L (ref 0–35)
AST: 25 U/L (ref 0–37)
Albumin: 4.1 g/dL (ref 3.5–5.2)
Alkaline Phosphatase: 41 U/L (ref 39–117)
BUN: 12 mg/dL (ref 6–23)
CO2: 28 mEq/L (ref 19–32)
Calcium: 10.2 mg/dL (ref 8.4–10.5)
Chloride: 103 mEq/L (ref 96–112)
Creatinine, Ser: 0.91 mg/dL (ref 0.40–1.20)
GFR: 65.73 mL/min (ref >60.00)
Glucose, Bld: 87 mg/dL (ref 70–99)
Potassium: 3.5 mEq/L (ref 3.5–5.1)
Sodium: 140 mEq/L (ref 135–145)
Total Bilirubin: 0.8 mg/dL (ref 0.2–1.2)
Total Protein: 6.6 g/dL (ref 6.0–8.3)

## 2022-10-25 MED ORDER — DIPHENHYDRAMINE HCL 25 MG PO CAPS
25.0000 mg | ORAL_CAPSULE | Freq: Once | ORAL | Status: AC
  Administered 2022-10-25: 25 mg via ORAL
  Filled 2022-10-25: qty 1

## 2022-10-25 MED ORDER — SODIUM CHLORIDE 0.9 % IV SOLN
5.0000 mg/kg | Freq: Once | INTRAVENOUS | Status: AC
  Administered 2022-10-25: 300 mg via INTRAVENOUS
  Filled 2022-10-25: qty 30

## 2022-10-25 MED ORDER — ACETAMINOPHEN 325 MG PO TABS
650.0000 mg | ORAL_TABLET | Freq: Once | ORAL | Status: AC
  Administered 2022-10-25: 650 mg via ORAL
  Filled 2022-10-25: qty 2

## 2022-10-25 NOTE — Progress Notes (Signed)
Diagnosis: Crohn's Disease  Provider:  Chilton Greathouse MD  Procedure: IV Infusion  IV Type: Peripheral, IV Location: L Hand  Avsola (infliximab-axxq), Dose: 300 mg  Infusion Start Time: 0943  Infusion Stop Time: 1155  Post Infusion IV Care: Peripheral IV Discontinued  Discharge: Condition: Good, Destination: Home . AVS Declined  Performed by:  Loney Hering, LPN

## 2022-10-31 DIAGNOSIS — C44712 Basal cell carcinoma of skin of right lower limb, including hip: Secondary | ICD-10-CM | POA: Diagnosis not present

## 2022-10-31 DIAGNOSIS — C44719 Basal cell carcinoma of skin of left lower limb, including hip: Secondary | ICD-10-CM | POA: Diagnosis not present

## 2022-10-31 DIAGNOSIS — C44619 Basal cell carcinoma of skin of left upper limb, including shoulder: Secondary | ICD-10-CM | POA: Diagnosis not present

## 2022-11-13 ENCOUNTER — Other Ambulatory Visit: Payer: Self-pay

## 2022-11-13 MED ORDER — ROSUVASTATIN CALCIUM 10 MG PO TABS: 10.0000 mg | ORAL_TABLET | Freq: Every day | ORAL | 3 refills | Status: DC

## 2022-11-13 NOTE — Telephone Encounter (Signed)
LR 11/14/21 #90, 3 rf LOV  01/17/22 FOV  01/19/23  Rx sent to pharmacy.  Dm/cma

## 2022-11-14 DIAGNOSIS — K080 Exfoliation of teeth due to systemic causes: Secondary | ICD-10-CM | POA: Diagnosis not present

## 2022-11-23 DIAGNOSIS — K080 Exfoliation of teeth due to systemic causes: Secondary | ICD-10-CM | POA: Diagnosis not present

## 2022-12-20 ENCOUNTER — Other Ambulatory Visit (INDEPENDENT_AMBULATORY_CARE_PROVIDER_SITE_OTHER): Payer: Medicare Other

## 2022-12-20 ENCOUNTER — Ambulatory Visit (INDEPENDENT_AMBULATORY_CARE_PROVIDER_SITE_OTHER): Payer: Medicare Other

## 2022-12-20 VITALS — BP 132/85 | HR 69 | Temp 98.1°F | Resp 16 | Ht 64.0 in | Wt 125.0 lb

## 2022-12-20 DIAGNOSIS — D869 Sarcoidosis, unspecified: Secondary | ICD-10-CM | POA: Diagnosis not present

## 2022-12-20 DIAGNOSIS — Z79899 Other long term (current) drug therapy: Secondary | ICD-10-CM

## 2022-12-20 LAB — COMPREHENSIVE METABOLIC PANEL
ALT: 22 U/L (ref 0–35)
AST: 26 U/L (ref 0–37)
Albumin: 4.3 g/dL (ref 3.5–5.2)
Alkaline Phosphatase: 42 U/L (ref 39–117)
BUN: 15 mg/dL (ref 6–23)
CO2: 32 mEq/L (ref 19–32)
Calcium: 9.8 mg/dL (ref 8.4–10.5)
Chloride: 103 mEq/L (ref 96–112)
Creatinine, Ser: 0.98 mg/dL (ref 0.40–1.20)
GFR: 60.07 mL/min (ref >60.00)
Glucose, Bld: 92 mg/dL (ref 70–99)
Potassium: 3.5 mEq/L (ref 3.5–5.1)
Sodium: 141 mEq/L (ref 135–145)
Total Bilirubin: 0.9 mg/dL (ref 0.2–1.2)
Total Protein: 6.7 g/dL (ref 6.0–8.3)

## 2022-12-20 LAB — CBC WITH DIFFERENTIAL/PLATELET
Basophils Absolute: 0 10*3/uL (ref 0.0–0.1)
Basophils Relative: 1.1 % (ref 0.0–3.0)
Eosinophils Absolute: 0.1 10*3/uL (ref 0.0–0.7)
Eosinophils Relative: 2 % (ref 0.0–5.0)
HCT: 42.2 % (ref 36.0–46.0)
Hemoglobin: 13.9 g/dL (ref 12.0–15.0)
Lymphocytes Relative: 35.7 % (ref 12.0–46.0)
Lymphs Abs: 1.1 10*3/uL (ref 0.7–4.0)
MCHC: 33.1 g/dL (ref 30.0–36.0)
MCV: 91.5 fl (ref 78.0–100.0)
Monocytes Absolute: 0.4 10*3/uL (ref 0.1–1.0)
Monocytes Relative: 13.6 % — ABNORMAL HIGH (ref 3.0–12.0)
Neutro Abs: 1.4 10*3/uL (ref 1.4–7.7)
Neutrophils Relative %: 47.6 % (ref 43.0–77.0)
Platelets: 217 10*3/uL (ref 150.0–400.0)
RBC: 4.61 Mil/uL (ref 3.87–5.11)
RDW: 12.8 % (ref 11.5–15.5)
WBC: 3 10*3/uL — ABNORMAL LOW (ref 4.0–10.5)

## 2022-12-20 MED ORDER — SODIUM CHLORIDE 0.9 % IV SOLN
5.0000 mg/kg | Freq: Once | INTRAVENOUS | Status: AC
  Administered 2022-12-20: 300 mg via INTRAVENOUS
  Filled 2022-12-20: qty 30

## 2022-12-20 MED ORDER — ACETAMINOPHEN 325 MG PO TABS
650.0000 mg | ORAL_TABLET | Freq: Once | ORAL | Status: AC
  Administered 2022-12-20: 650 mg via ORAL
  Filled 2022-12-20: qty 2

## 2022-12-20 MED ORDER — DIPHENHYDRAMINE HCL 25 MG PO CAPS
25.0000 mg | ORAL_CAPSULE | Freq: Once | ORAL | Status: AC
  Administered 2022-12-20: 25 mg via ORAL
  Filled 2022-12-20: qty 1

## 2022-12-20 NOTE — Progress Notes (Signed)
Diagnosis: Crohn's Disease  Provider:  Chilton Greathouse MD  Procedure: IV Infusion  IV Type: Peripheral, IV Location: R Forearm  Avsola (infliximab-axxq), Dose: 300 mg  Infusion Start Time: 1610  Infusion Stop Time: 1143  Post Infusion IV Care: Peripheral IV Discontinued  Discharge: Condition: Good, Destination: Home . AVS Declined  Performed by:  Adriana Mccallum, RN

## 2023-01-08 ENCOUNTER — Other Ambulatory Visit: Payer: Self-pay

## 2023-01-08 MED ORDER — POTASSIUM CHLORIDE CRYS ER 20 MEQ PO TBCR: 20.0000 meq | EXTENDED_RELEASE_TABLET | Freq: Every day | ORAL | 3 refills | Status: DC

## 2023-01-12 NOTE — Progress Notes (Signed)
Annual Wellness Visit    Patient Care Team: Margaree Mackintosh, MD as PCP - General (Internal Medicine)  Visit Date: 01/19/23   Chief Complaint  Patient presents with   Medicare Wellness    Eye xam [facility] Dr. Scot Jun     Subjective:   Patient: Tamara Myers, Female    DOB: August 16, 1955, 67 y.o.   MRN: [id number]  Tamara Myers is a 67 y.o. Female who presents today for her Annual Wellness Visit. History of basal cell carcinoma, bilateral cataracts, GERD, hyperlipidemia, hypertension, melanoma left upper arm, osteoporosis, pulmonary sarcoidosis.  History of hypertension treated with amlodipine 5 mg daily, triamterene-hydrochlorothiazide 37.5-25 mg daily.  History of hyperlipidemia treated with rosuvastatin 10 mg daily. Lipid panel normal.   History of GERD treated with pantoprazole 40 mg daily.  History of asthma treated with albuterol, Advair inhalers.  Sarcoid was diagnosed on bronchoscopy July 2020 with granulomatous inflammation being noted.  Started on prednisone in August 2020.  Had improvement.  Was started on methotrexate and steroids November 2020.  She continues steroids until October 2021 and maintained only on methotrexate.  Methotrexate taper was attempted in the fall 2022 but symptoms recurred.  Had to continue methotrexate March 2023 due to leukopenia.  Had to be restarted on prednisone.  Started azathioprine in May 2023 and started on prednisone taper as well.  Prednisone was tapered by Dr. Everardo All after visit July 19.  History of mild hypokalemia treated with potassium chloride 20 mEq daily.   History of dysplastic nevus of right leg seen by Dr. Irene Limbo, Mohs surgeon.  Atypical nevus removed from arm as a basal cell carcinoma removed in the past as well.  Sarcoidosis was discovered by allergist when she was referred there from this office for protracted cough.  She had a CT scan during allergy evaluation which was abnormal.  At that time she had been  coughing for some 12 months and had no improvement with Protonix and Zantac.  ENT has seen her as well.  She had bronchoscopy in July 2020.  She had mediastinal adenopathy on chest CT.  She had transbronchial needle aspiration biopsies.  Cultures were obtained.  No malignant cells identified.  Biopsy showed granulomatous inflammation consistent with sarcoidosis.  Cultures were negative for AFB and fungus.  Followed by pulmonology.   Osteoporosis was diagnosed by Dr. Romero Belling in 2021 and treated with Reclast.  Takes vitamin D supplement.  She is postmenopausal and is a thin white female but prednisone therapy likely exacerbated her osteoporosis.  Labs are reviewed. Glucose normal. Kidney, liver functions normal. Electrolytes normal. Blood proteins normal. WBC low at 2,800, absolute lymphocytes low at 734. TSH at 1.64.   Mammogram last completed 03/21/22. No mammographic evidence of malignancy.   Had colonoscopy April 2023 and 2 hyperplastic polyps were removed.   She had bone density study in January 2023 and had osteopenia with T score -2.3.  Social history: Married with 2 adult children.  She does not smoke or consume alcohol.  She works for the town of [place].   Family history: Father with history of CABG, diabetes, hyperlipidemia, hypertension died from respiratory failure.  He had pulmonary fibrosis.  Mother with history of Mnire's disease, macular degeneration and remote history of melanoma.  Past Medical History:  Diagnosis Date   Cancer (HCC)    basal cell carcinoma   Cataract    bilateral   Complication of anesthesia    slow to wake up  Dyspnea    on exertion   GERD (gastroesophageal reflux disease)    on meds   Hyperlipidemia    on meds   Hypertension    on meds   Melanoma (HCC) 2018   left upper arm   Moderate persistent asthma without complication 04/04/2018   on meds   Osteoporosis    confirmed by bone scan   PONV (postoperative nausea and vomiting)     Post menopausal syndrome    Pulmonary sarcoidosis (HCC)    tx'd     Family History  Problem Relation Age of Onset   Osteoporosis Mother    Diabetes Father    Heart disease Father    Hyperlipidemia Father    Hypertension Father    Pulmonary fibrosis Father    Asthma Daughter    Breast cancer Neg Hx    Allergic rhinitis Neg Hx    Eczema Neg Hx    Urticaria Neg Hx    Angioedema Neg Hx    Colon polyps Neg Hx    Colon cancer Neg Hx    Esophageal cancer Neg Hx    Rectal cancer Neg Hx    Stomach cancer Neg Hx          Review of Systems  Constitutional:  Negative for chills, fever, malaise/fatigue and weight loss.  HENT:  Negative for hearing loss, sinus pain and sore throat.   Respiratory:  Negative for cough, hemoptysis and shortness of breath.   Cardiovascular:  Negative for chest pain, palpitations, leg swelling and PND.  Gastrointestinal:  Negative for abdominal pain, constipation, diarrhea, heartburn, nausea and vomiting.  Genitourinary:  Negative for dysuria, frequency and urgency.  Musculoskeletal:  Negative for back pain, myalgias and neck pain.  Skin:  Negative for itching and rash.  Neurological:  Negative for dizziness, tingling, seizures and headaches.  Endo/Heme/Allergies:  Negative for polydipsia.  Psychiatric/Behavioral:  Negative for depression. The patient is not nervous/anxious.       Objective:   Vitals: BP 110/80   Pulse 77   Ht 5\' 3"  (1.6 m)   Wt 123 lb (55.8 kg)   SpO2 97%   BMI 21.79 kg/m   Physical Exam Vitals and nursing note reviewed.  Constitutional:      General: She is not in acute distress.    Appearance: Normal appearance. She is not ill-appearing or toxic-appearing.  HENT:     Head: Normocephalic and atraumatic.     Right Ear: Hearing, tympanic membrane, ear canal and external ear normal.     Left Ear: Hearing, tympanic membrane, ear canal and external ear normal.     Mouth/Throat:     Pharynx: Oropharynx is clear.  Eyes:      Extraocular Movements: Extraocular movements intact.     Pupils: Pupils are equal, round, and reactive to light.  Neck:     Thyroid: No thyroid mass, thyromegaly or thyroid tenderness.     Vascular: No carotid bruit.  Cardiovascular:     Rate and Rhythm: Normal rate and regular rhythm. No extrasystoles are present.    Pulses:          Dorsalis pedis pulses are 1+ on the right side and 1+ on the left side.     Heart sounds: Normal heart sounds. No murmur heard.    No friction rub. No gallop.  Pulmonary:     Effort: Pulmonary effort is normal.     Breath sounds: Normal breath sounds. No decreased breath sounds, wheezing, rhonchi or rales.  Chest:     Chest wall: No mass.  Abdominal:     Palpations: Abdomen is soft. There is no hepatomegaly, splenomegaly or mass.     Tenderness: There is no abdominal tenderness.     Hernia: No hernia is present.  Genitourinary:    Comments: NFEG. Bimanual exam normal.  Stool guaiac negative. Musculoskeletal:     Cervical back: Normal range of motion.     Right lower leg: No edema.     Left lower leg: No edema.  Lymphadenopathy:     Cervical: No cervical adenopathy.     Upper Body:     Right upper body: No supraclavicular adenopathy.     Left upper body: No supraclavicular adenopathy.  Skin:    General: Skin is warm and dry.  Neurological:     General: No focal deficit present.     Mental Status: She is alert and oriented to person, place, and time. Mental status is at baseline.     Sensory: Sensation is intact.     Motor: Motor function is intact. No weakness.     Deep Tendon Reflexes: Reflexes are normal and symmetric.  Psychiatric:        Attention and Perception: Attention normal.        Mood and Affect: Mood normal.        Speech: Speech normal.        Behavior: Behavior normal.        Thought Content: Thought content normal.        Cognition and Memory: Cognition normal.        Judgment: Judgment normal.     Most recent  functional status assessment:    01/19/2023   10:47 AM  In your present state of health, do you have any difficulty performing the following activities:  Hearing? 0  Difficulty concentrating or making decisions? 0  Walking or climbing stairs? 0  Dressing or bathing? 0  Doing errands, shopping? 0  Preparing Food and eating ? N  Using the Toilet? N  In the past six months, have you accidently leaked urine? N  Do you have problems with loss of bowel control? N  Managing your Medications? N  Managing your Finances? N  Housekeeping or managing your Housekeeping? N   Most recent fall risk assessment:    01/19/2023   10:59 AM  Fall Risk   Falls in the past year? 0  Number falls in past yr: 0  Injury with Fall? 0  Risk for fall due to : No Fall Risks  Follow up Falls evaluation completed    Most recent depression screenings:    01/19/2023   10:59 AM 01/17/2022   10:02 AM  PHQ 2/9 Scores  PHQ - 2 Score 0 0   Most recent cognitive screening:    01/19/2023   11:00 AM  6CIT Screen  What Year? 0 points  What month? 0 points  What time? 0 points  Count back from 20 0 points  Months in reverse 0 points  Repeat phrase 0 points  Total Score 0 points     Results:   Studies obtained and personally reviewed by me:  Mammogram last completed 03/21/22. No mammographic evidence of malignancy.   Had colonoscopy April 2023 and 2 hyperplastic polyps were removed.   She had bone density study in January 2023 and had osteopenia with T score -2.3.  Labs:       Component Value Date/Time   NA 141 01/15/2023 0929  NA 143 06/16/2022 0919   K 3.9 01/15/2023 0929   CL 101 01/15/2023 0929   CO2 31 01/15/2023 0929   GLUCOSE 83 01/15/2023 0929   BUN 15 01/15/2023 0929   BUN 10 06/16/2022 0919   CREATININE 0.81 01/15/2023 0929   CALCIUM 10.1 01/15/2023 0929   PROT 6.9 01/15/2023 0929   PROT 6.1 06/16/2022 0919   ALBUMIN 4.3 12/20/2022 0910   ALBUMIN 4.5 06/16/2022 0919   AST 28  01/15/2023 0929   ALT 22 01/15/2023 0929   ALKPHOS 42 12/20/2022 0910   BILITOT 1.0 01/15/2023 0929   BILITOT 0.6 06/16/2022 0919   GFRNONAA 46 (L) 01/06/2020 0908   GFRAA 53 (L) 01/06/2020 0908     Lab Results  Component Value Date   WBC 2.8 (L) 01/15/2023   HGB 14.1 01/15/2023   HCT 42.9 01/15/2023   MCV 90.7 01/15/2023   PLT 226 01/15/2023    Lab Results  Component Value Date   CHOL 194 01/15/2023   HDL 103 01/15/2023   LDLCALC 75 01/15/2023   TRIG 75 01/15/2023   CHOLHDL 1.9 01/15/2023    No results found for: "HGBA1C"   Lab Results  Component Value Date   TSH 1.64 01/15/2023    Assessment & Plan:   Hypertension: treated with amlodipine 5 mg daily, triamterene-hydrochlorothiazide 37.5-25 mg daily. Blood pressure normal today at 110/80.  Hyperlipidemia: treated with rosuvastatin 10 mg daily. Lipid panel normal.   GERD: stable with pantoprazole 40 mg daily.  Asthma: treated with albuterol, Advair inhalers.  Mild hypokalemia: treated with potassium chloride 20 mEq daily.   Sarcoidosis- treated by Dr Luciano Cutter with Infleximab-axxq  Leukopenia- persistent ? Etiology unclear.  Has been present since Fall 2023. Did improve Spring 2024. Will advise Dr. Everardo All of results  Urinalysis normal today.  Osteoporosis was diagnosed by Dr. Romero Belling in 2021 and treated with Reclast.    Mammogram last completed 03/21/22. No mammographic evidence of malignancy.   Had colonoscopy April 2023 and 2 hyperplastic polyps were removed.   She had bone density study in January 2023 and had osteopenia with T score -2.3.  Vaccine counseling: UTD on tetanus, shingles, pneumococcal 20 vaccines.   Return in 1 year or as needed.     Annual wellness visit done today including the all of the following: Reviewed patient's Family Medical History Reviewed and updated list of patient's medical providers Assessment of cognitive impairment was done Assessed patient's  functional ability Established a written schedule for health screening services Health Risk Assessent Completed and Reviewed  Discussed health benefits of physical activity, and encouraged her to engage in regular exercise appropriate for her age and condition.        I,Alexander Ruley,acting as a Neurosurgeon for Margaree Mackintosh, MD.,have documented all relevant documentation on the behalf of Margaree Mackintosh, MD,as directed by  Margaree Mackintosh, MD while in the presence of Margaree Mackintosh, MD.   I, Margaree Mackintosh, MD, have reviewed all documentation for this visit. The documentation on 01/23/23 for the exam, diagnosis, procedures, and orders are all accurate and complete.

## 2023-01-15 ENCOUNTER — Other Ambulatory Visit: Payer: Medicare Other

## 2023-01-15 ENCOUNTER — Other Ambulatory Visit: Payer: Self-pay

## 2023-01-15 DIAGNOSIS — Z0000 Encounter for general adult medical examination without abnormal findings: Secondary | ICD-10-CM | POA: Diagnosis not present

## 2023-01-15 DIAGNOSIS — E7800 Pure hypercholesterolemia, unspecified: Secondary | ICD-10-CM | POA: Diagnosis not present

## 2023-01-15 DIAGNOSIS — M8580 Other specified disorders of bone density and structure, unspecified site: Secondary | ICD-10-CM

## 2023-01-15 DIAGNOSIS — I10 Essential (primary) hypertension: Secondary | ICD-10-CM

## 2023-01-15 MED ORDER — AMLODIPINE BESYLATE 5 MG PO TABS: 5.0000 mg | ORAL_TABLET | Freq: Every day | ORAL | 1 refills | Status: DC

## 2023-01-15 MED ORDER — TRIAMTERENE-HCTZ 37.5-25 MG PO TABS: 1.0000 | ORAL_TABLET | Freq: Every day | ORAL | 1 refills | Status: DC

## 2023-01-16 LAB — CBC WITH DIFFERENTIAL/PLATELET
Absolute Monocytes: 350 {cells}/uL (ref 200–950)
Basophils Absolute: 11 {cells}/uL (ref 0–200)
Basophils Relative: 0.4 %
Eosinophils Absolute: 39 cells/uL (ref 15–500)
Eosinophils Relative: 1.4 %
HCT: 42.9 % (ref 35.0–45.0)
Hemoglobin: 14.1 g/dL (ref 11.7–15.5)
Lymphs Abs: 734 cells/uL — ABNORMAL LOW (ref 850–3900)
MCH: 29.8 pg (ref 27.0–33.0)
MCHC: 32.9 g/dL (ref 32.0–36.0)
MCV: 90.7 fL (ref 80.0–100.0)
MPV: 11.7 fL (ref 7.5–12.5)
Monocytes Relative: 12.5 %
Neutro Abs: 1666 {cells}/uL (ref 1500–7800)
Neutrophils Relative %: 59.5 %
Platelets: 226 10*3/uL (ref 140–400)
RBC: 4.73 10*6/uL (ref 3.80–5.10)
RDW: 11.9 % (ref 11.0–15.0)
Total Lymphocyte: 26.2 %
WBC: 2.8 10*3/uL — ABNORMAL LOW (ref 3.8–10.8)

## 2023-01-16 LAB — COMPLETE METABOLIC PANEL WITH GFR
AG Ratio: 2 (calc) (ref 1.0–2.5)
ALT: 22 U/L (ref 6–29)
AST: 28 U/L (ref 10–35)
Albumin: 4.6 g/dL (ref 3.6–5.1)
Alkaline phosphatase (APISO): 41 U/L (ref 37–153)
BUN: 15 mg/dL (ref 7–25)
CO2: 31 mmol/L (ref 20–32)
Calcium: 10.1 mg/dL (ref 8.6–10.4)
Chloride: 101 mmol/L (ref 98–110)
Creat: 0.81 mg/dL (ref 0.50–1.05)
Globulin: 2.3 g/dL (ref 1.9–3.7)
Glucose, Bld: 83 mg/dL (ref 65–99)
Potassium: 3.9 mmol/L (ref 3.5–5.3)
Sodium: 141 mmol/L (ref 135–146)
Total Bilirubin: 1 mg/dL (ref 0.2–1.2)
Total Protein: 6.9 g/dL (ref 6.1–8.1)
eGFR: 80 mL/min/{1.73_m2} (ref >=60)

## 2023-01-16 LAB — LIPID PANEL
Cholesterol: 194 mg/dL (ref <200)
HDL: 103 mg/dL (ref >=50)
LDL Cholesterol (Calc): 75 mg/dL
Non-HDL Cholesterol (Calc): 91 mg/dL (ref <130)
Total CHOL/HDL Ratio: 1.9 (calc) (ref <5.0)
Triglycerides: 75 mg/dL (ref <150)

## 2023-01-16 LAB — TSH: TSH: 1.64 m[IU]/L (ref 0.40–4.50)

## 2023-01-19 ENCOUNTER — Ambulatory Visit (INDEPENDENT_AMBULATORY_CARE_PROVIDER_SITE_OTHER): Payer: Medicare Other | Admitting: Internal Medicine

## 2023-01-19 ENCOUNTER — Encounter: Payer: Self-pay | Admitting: Internal Medicine

## 2023-01-19 VITALS — BP 110/80 | HR 77 | Ht 63.0 in | Wt 123.0 lb

## 2023-01-19 DIAGNOSIS — Z0000 Encounter for general adult medical examination without abnormal findings: Secondary | ICD-10-CM

## 2023-01-19 DIAGNOSIS — Z8719 Personal history of other diseases of the digestive system: Secondary | ICD-10-CM

## 2023-01-19 DIAGNOSIS — D72819 Decreased white blood cell count, unspecified: Secondary | ICD-10-CM

## 2023-01-19 DIAGNOSIS — E7800 Pure hypercholesterolemia, unspecified: Secondary | ICD-10-CM

## 2023-01-19 DIAGNOSIS — Z1211 Encounter for screening for malignant neoplasm of colon: Secondary | ICD-10-CM | POA: Diagnosis not present

## 2023-01-19 DIAGNOSIS — D860 Sarcoidosis of lung: Secondary | ICD-10-CM | POA: Diagnosis not present

## 2023-01-19 DIAGNOSIS — Z92241 Personal history of systemic steroid therapy: Secondary | ICD-10-CM

## 2023-01-19 DIAGNOSIS — M8580 Other specified disorders of bone density and structure, unspecified site: Secondary | ICD-10-CM

## 2023-01-19 DIAGNOSIS — I10 Essential (primary) hypertension: Secondary | ICD-10-CM | POA: Diagnosis not present

## 2023-01-19 LAB — POCT URINALYSIS DIP (CLINITEK)
Bilirubin, UA: NEGATIVE
Blood, UA: NEGATIVE
Glucose, UA: NEGATIVE mg/dL
Ketones, POC UA: NEGATIVE mg/dL
Leukocytes, UA: NEGATIVE
Nitrite, UA: NEGATIVE
POC PROTEIN,UA: NEGATIVE
Spec Grav, UA: 1.01 (ref 1.010–1.025)
Urobilinogen, UA: 0.2 E.U./dL
pH, UA: 7 (ref 5.0–8.0)

## 2023-01-19 LAB — IFOBT (OCCULT BLOOD): IFOBT: NEGATIVE

## 2023-01-23 NOTE — Patient Instructions (Addendum)
It was a pleasure to see you today. Bone density due 2025. Vaccines reviewed. Recommendations made.Continue close follow up with Pulmonary. Continue to monitor white blood cell count

## 2023-02-14 ENCOUNTER — Ambulatory Visit: Payer: Medicare Other

## 2023-02-14 VITALS — BP 123/79 | HR 72 | Temp 98.0°F | Resp 16 | Ht 63.0 in | Wt 125.6 lb

## 2023-02-14 DIAGNOSIS — D869 Sarcoidosis, unspecified: Secondary | ICD-10-CM

## 2023-02-14 DIAGNOSIS — Z79899 Other long term (current) drug therapy: Secondary | ICD-10-CM

## 2023-02-14 MED ORDER — SODIUM CHLORIDE 0.9 % IV SOLN
5.0000 mg/kg | Freq: Once | INTRAVENOUS | Status: AC
  Administered 2023-02-14: 300 mg via INTRAVENOUS
  Filled 2023-02-14: qty 30

## 2023-02-14 MED ORDER — ACETAMINOPHEN 325 MG PO TABS
650.0000 mg | ORAL_TABLET | Freq: Once | ORAL | Status: AC
  Administered 2023-02-14: 650 mg via ORAL
  Filled 2023-02-14: qty 2

## 2023-02-14 MED ORDER — DIPHENHYDRAMINE HCL 25 MG PO CAPS
25.0000 mg | ORAL_CAPSULE | Freq: Once | ORAL | Status: AC
  Administered 2023-02-14: 25 mg via ORAL
  Filled 2023-02-14: qty 1

## 2023-02-14 NOTE — Progress Notes (Signed)
Diagnosis: Sarcoidosis   Provider:  Chilton Greathouse MD  Procedure: IV Infusion  IV Type: Peripheral, IV Location: L Forearm  Avsola (infliximab-axxq), Dose: 300 mg  Infusion Start Time: 0931  Infusion Stop Time: 1157  Post Infusion IV Care: Peripheral IV Discontinued  Discharge: Condition: Good, Destination: Home . AVS Declined  Performed by:  Wyvonne Lenz, RN

## 2023-03-05 ENCOUNTER — Other Ambulatory Visit: Payer: Self-pay | Admitting: Pulmonary Disease

## 2023-03-14 NOTE — Telephone Encounter (Signed)
CareTeam was also updated.

## 2023-03-14 NOTE — Telephone Encounter (Signed)
Notes found in media and abstracted into the chart.

## 2023-03-15 ENCOUNTER — Ambulatory Visit (HOSPITAL_BASED_OUTPATIENT_CLINIC_OR_DEPARTMENT_OTHER)
Admission: RE | Admit: 2023-03-15 | Discharge: 2023-03-15 | Disposition: A | Discharge status: Home / Self Care | Payer: Medicare Other | Source: Ambulatory Visit | Attending: Pulmonary Disease | Admitting: Pulmonary Disease

## 2023-03-15 DIAGNOSIS — J984 Other disorders of lung: Secondary | ICD-10-CM | POA: Diagnosis not present

## 2023-03-15 DIAGNOSIS — D869 Sarcoidosis, unspecified: Secondary | ICD-10-CM | POA: Insufficient documentation

## 2023-03-15 DIAGNOSIS — R918 Other nonspecific abnormal finding of lung field: Secondary | ICD-10-CM | POA: Diagnosis not present

## 2023-03-15 DIAGNOSIS — I3139 Other pericardial effusion (noninflammatory): Secondary | ICD-10-CM | POA: Diagnosis not present

## 2023-03-16 ENCOUNTER — Encounter (HOSPITAL_BASED_OUTPATIENT_CLINIC_OR_DEPARTMENT_OTHER): Payer: Self-pay | Admitting: Pulmonary Disease

## 2023-03-16 DIAGNOSIS — D869 Sarcoidosis, unspecified: Secondary | ICD-10-CM

## 2023-03-22 ENCOUNTER — Other Ambulatory Visit: Payer: Self-pay | Admitting: Pulmonary Disease

## 2023-03-22 DIAGNOSIS — D869 Sarcoidosis, unspecified: Secondary | ICD-10-CM | POA: Diagnosis not present

## 2023-03-23 LAB — CBC WITH DIFFERENTIAL/PLATELET
Basophils Absolute: 0 10*3/uL (ref 0.0–0.2)
Basos: 0 %
EOS (ABSOLUTE): 0.1 10*3/uL (ref 0.0–0.4)
Eos: 1 %
Hematocrit: 41.7 % (ref 34.0–46.6)
Hemoglobin: 13.7 g/dL (ref 11.1–15.9)
Immature Grans (Abs): 0 10*3/uL (ref 0.0–0.1)
Immature Granulocytes: 0 %
Lymphocytes Absolute: 1 10*3/uL (ref 0.7–3.1)
Lymphs: 28 %
MCH: 30.4 pg (ref 26.6–33.0)
MCHC: 32.9 g/dL (ref 31.5–35.7)
MCV: 93 fL (ref 79–97)
Monocytes Absolute: 0.5 10*3/uL (ref 0.1–0.9)
Monocytes: 14 %
Neutrophils Absolute: 2.1 10*3/uL (ref 1.4–7.0)
Neutrophils: 57 %
Platelets: 247 10*3/uL (ref 150–450)
RBC: 4.5 x10E6/uL (ref 3.77–5.28)
RDW: 11.6 % — ABNORMAL LOW (ref 11.7–15.4)
WBC: 3.7 10*3/uL (ref 3.4–10.8)

## 2023-03-23 LAB — COMPREHENSIVE METABOLIC PANEL
ALT: 25 [IU]/L (ref 0–32)
AST: 30 [IU]/L (ref 0–40)
Albumin: 4.5 g/dL (ref 3.9–4.9)
Alkaline Phosphatase: 52 [IU]/L (ref 44–121)
BUN/Creatinine Ratio: 18 (ref 12–28)
BUN: 18 mg/dL (ref 8–27)
Bilirubin Total: 0.7 mg/dL (ref 0.0–1.2)
CO2: 25 mmol/L (ref 20–29)
Calcium: 10.2 mg/dL (ref 8.7–10.3)
Chloride: 102 mmol/L (ref 96–106)
Creatinine, Ser: 0.99 mg/dL (ref 0.57–1.00)
Globulin, Total: 2.1 g/dL (ref 1.5–4.5)
Glucose: 87 mg/dL (ref 70–99)
Potassium: 3.6 mmol/L (ref 3.5–5.2)
Sodium: 142 mmol/L (ref 134–144)
Total Protein: 6.6 g/dL (ref 6.0–8.5)
eGFR: 63 mL/min/{1.73_m2} (ref >59)

## 2023-04-02 ENCOUNTER — Ambulatory Visit (HOSPITAL_BASED_OUTPATIENT_CLINIC_OR_DEPARTMENT_OTHER): Payer: Medicare Other | Admitting: Pulmonary Disease

## 2023-04-02 ENCOUNTER — Encounter (HOSPITAL_BASED_OUTPATIENT_CLINIC_OR_DEPARTMENT_OTHER): Payer: Self-pay | Admitting: Pulmonary Disease

## 2023-04-02 VITALS — BP 114/74 | HR 77 | Temp 98.2°F | Ht 63.0 in | Wt 123.6 lb

## 2023-04-02 DIAGNOSIS — D869 Sarcoidosis, unspecified: Secondary | ICD-10-CM

## 2023-04-02 DIAGNOSIS — Z79899 Other long term (current) drug therapy: Secondary | ICD-10-CM | POA: Diagnosis not present

## 2023-04-02 MED ORDER — PREDNISONE 10 MG PO TABS: ORAL_TABLET | ORAL | 0 refills | Status: AC

## 2023-04-02 MED ORDER — AZITHROMYCIN 250 MG PO TABS: ORAL_TABLET | ORAL | 0 refills | Status: DC

## 2023-04-02 MED ORDER — FLUTICASONE-SALMETEROL 230-21 MCG/ACT IN AERO: 2.0000 | INHALATION_SPRAY | Freq: Two times a day (BID) | RESPIRATORY_TRACT | 11 refills | Status: DC

## 2023-04-02 NOTE — Patient Instructions (Addendum)
Asthma  --CONTINUE Advair 230-21 TWO puffs TWICE a day. Ok to trial decreasing dose to one puff in morning and evening. 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 Resolved nodular changes in LLL but new GGO/nodules in RUL but very minimal --Continue infliximab

## 2023-04-02 NOTE — Progress Notes (Signed)
Subjective:   PATIENT ID: Tamara Myers: female DOB: [birth year], MRN: [id number]   HPI  Chief Complaint  Patient presents with   Follow-up    Coughing with green mucus, some nasal drainage, no headache or fever    Reason for Visit: Follow-up sarcoid  Tamara Myers is a 67 year old female with sarcoidosis, asthma, osteoporosis, GERD, HLD, HTN who presents for follow-up  Synopsis: She initially was seen by Dr. Tonia Brooms in 2020 with symptoms of progressive cough and CT demonstrating perilymphatic micro-nodularity and bilateral hilar adenopathy including 1.4 cm hilar lymph node and 1cm RLL nodule consistent with sarcoid. Bronchoscopy on 11/20/18 with granulomatous inflammation. Started on prednisone in August 2020 with clinical improvement and improvement of parenchymal changes and nodularity. Started on methotrexate 10 mg and steroids November 2020. Continue steroids until 01/2020 and maintained only methotrexate with stable respiratory symptoms. Attempted methotrexate taper in fall 2022 however symptoms recurred. Has been controlled on methotrexate on low dosage 5-7.5. Had to discontinue in March 2023 due to leukopenia. Had to be restarted on prednisone. Started on azathioprine May 2023 and currently on reduced dosing due to leukopenia.  04/12/22 Since our last visit she reports improved symptoms however did develop a cough in the last three weeks. This may have been preceded by cold but not sure if it is related. Using albuterol once a week and advair as directed. Cough is sometimes productive. No wheezing. Compliant with azathioprine. Denies side effects (nausea, rash, hair loss, discoloration of urine or tears).  06/01/22 She has continued to have productive cough that has now progressed to green sputum to whitish sputum. Increased frequency. Denies fevers/chills. Compliant with azathioprine. Denies side effects (nausea, rash, hair loss, discoloration of urine or  tears)  06/19/22 Since our last visit she completed her antibiotic. Cough has improved. Denies shortness of breath or wheezing. She has been compliant with low-dose azathioprine but noticed her labs demonstrated leukopenia again.  09/20/22 Since our last visit she was started on Infliximab 07/12/22. Tolerating well. Cough has improved and no longer nocturnal cough. Compliant with Advair. Denies shortness of breath or wheezing.  04/02/23 Since our last visit she has been overall well. She did pick up a cold last week. No fevers, chills. But symptoms persist with cough.  Social History: Audiological scientist for [employer] Father passed from pulmonary fibrosis  Past Medical History:  Diagnosis Date   Cancer (HCC)    basal cell carcinoma   Cataract    bilateral   Complication of anesthesia    slow to wake up   Dyspnea    on exertion   GERD (gastroesophageal reflux disease)    on meds   Hyperlipidemia    on meds   Hypertension    on meds   Melanoma (HCC) 2018   left upper arm   Moderate persistent asthma without complication 04/04/2018   on meds   Osteoporosis    confirmed by bone scan   PONV (postoperative nausea and vomiting)    Post menopausal syndrome    Pulmonary sarcoidosis (HCC)    tx'd     Family History  Problem Relation Age of Onset   Osteoporosis Mother    Diabetes Father    Heart disease Father    Hyperlipidemia Father    Hypertension Father    Pulmonary fibrosis Father    Asthma Daughter    Breast cancer Neg Hx    Allergic rhinitis Neg Hx    Eczema Neg Hx  Urticaria Neg Hx    Angioedema Neg Hx    Colon polyps Neg Hx    Colon cancer Neg Hx    Esophageal cancer Neg Hx    Rectal cancer Neg Hx    Stomach cancer Neg Hx      Social History   Occupational History   Not on file  Tobacco Use   Smoking status: Never   Smokeless tobacco: Never  Vaping Use   Vaping status: Never Used  Substance and Sexual Activity   Alcohol use: No   Drug  use: No   Sexual activity: Not on file    No Known Allergies   Outpatient Medications Prior to Visit  Medication Sig Dispense Refill   albuterol (VENTOLIN HFA) 108 (90 Base) MCG/ACT inhaler Inhale 2 puffs into the lungs every 6 (six) hours as needed for wheezing or shortness of breath. 18 g 1   amLODipine (NORVASC) 5 MG tablet Take 1 tablet (5 mg total) by mouth daily. 90 tablet 1   calcium carbonate (OSCAL) 1500 (600 Ca) MG TABS tablet Take by mouth 2 (two) times daily with a meal.     Dextromethorphan-guaiFENesin (MUCINEX DM PO) Take by mouth.     inFLIXimab-axxq (AVSOLA IV) Inject into the vein. Infuse 5 mg/kg at weeks 0, 2, and 6 then maintenance of 5 mg/kg every 8 weeks thereafter (receives at [facility])     pantoprazole (PROTONIX) 40 MG tablet TAKE 1 TABLET BY MOUTH DAILY 90 tablet 3   potassium chloride SA (KLOR-CON M) 20 MEQ tablet Take 1 tablet (20 mEq total) by mouth daily. 90 tablet 3   rosuvastatin (CRESTOR) 10 MG tablet Take 1 tablet (10 mg total) by mouth daily. 90 tablet 3   triamterene-hydrochlorothiazide (MAXZIDE-25) 37.5-25 MG tablet Take 1 tablet by mouth daily. 90 tablet 1   fluticasone-salmeterol (ADVAIR HFA) 230-21 MCG/ACT inhaler Inhale 2 puffs into the lungs 2 (two) times daily. 1 each 5   No facility-administered medications prior to visit.    Review of Systems  Constitutional:  Negative for chills, diaphoresis, fever, malaise/fatigue and weight loss.  HENT:  Negative for congestion.   Respiratory:  Positive for cough. Negative for hemoptysis, sputum production, shortness of breath and wheezing.   Cardiovascular:  Negative for chest pain, palpitations and leg swelling.     Objective:   Vitals:   04/02/23 1548  BP: 114/74  Pulse: 77  Temp: 98.2 F (36.8 C)  SpO2: 97%  Weight: 123 lb 9.6 oz (56.1 kg)  Height: 5\' 3"  (1.6 m)    SpO2: 97 %  Body mass index is 21.89 kg/m.  Physical Exam: General: Well-appearing, no acute  distress HENT: [place], AT Eyes: EOMI, no scleral icterus Respiratory: Coarse bases to auscultation bilaterally.  No crackles, wheezing or rales Cardiovascular: RRR, -M/R/G, no JVD Extremities:-Edema,-tenderness Neuro: AAO x4, CNII-XII grossly intact Psych: Normal mood, normal affect   Data Reviewed:  Imaging:  CT Chest 07/02/18 - Bilateral nodularity with perilymphatic distribution including 1cm RLL nodule. Right hilar enlargement.  CT Chest 10/18/18 - Progressive bilateral nodularity including RLL nodule ~61mm  CT Chest 03/10/19 - Improved parenchymal changes with mild disease in RML, lingular and superior LLL  CT Chest HR 06/30/19 - Improved parenchymal changes in setting of immunosuppression. RLL 5 mm subpleural nodule.  CT Chest HR 07/15/20 - No significant parenchymal abnormalities or consolidations seen. Stable calcified mediastinal and bilateral hilar nodes. New 2mm solid RUL nodule likely benign. Stable peripheral RUL GGO stable  and RLL 5mm nodule stable since 06/2019.  CT Chest 06/10/22 - Progressive pulmonary sarcoid with several new scattered irregular nodules including 10 mm LLL nodule and new GGO in RLL and RML, new subpleural consolidation in superior LLL  CT Chest 03/15/23 - New peribronchovascular GGO in RUL with new 7mm noncalcified nodule and new ground glass 5 mm nodule. Resolved left lower lobe nodules. Stable small subpleural consolidation in apical LLL. Calcified mediastinal and bilateral hilar lymph nodes  PFT: 01/23/18 FVC 2.53 (77%) FEV1 2.19 (86%) Ratio 87% Interpretation: No obstructive lung disease. Reduced FVC and FEV1  04/04/18 FVC 2.59 (78%) FEV1 1.93 (76%) Ratio 75% Interpretation: No obstructive lung disease. Reduced FVC and FEV1.  05/09/18 FVC 2.43 (73%) FEV1 1.6 (63%) Ratio 66% Interpretation: Moderate obstructive lung disease  11/06/18 FVC 2.92 (89%) FEV1 2.04 (74%) Ratio 74  (Pred 78) TLC 93% DLCO 100%. F-V loops suggestive of small airway  disease Interpretation: Mild obstructive defect. No significant BD  03/17/20 FVC 2.53 (79%) FEV1 1.96 (80%) Ratio 65  TLC 87% DLCO 101%. Significant BD response in FEV1 Interpretation: Mild obstructive defect with significant bronchodilator response     Latest Ref Rng & Units 03/22/2023   11:13 AM 01/15/2023    9:29 AM 12/20/2022    9:10 AM  CBC  WBC 3.4 - 10.8 x10E3/uL 3.7  2.8  3.0   Hemoglobin 11.1 - 15.9 g/dL 47.4  25.9  56.3   Hematocrit 34.0 - 46.6 % 41.7  42.9  42.2   Platelets 150 - 450 x10E3/uL 247  226  217.0   Resolved luekopenia on infliximab      Latest Ref Rng & Units 03/22/2023   11:13 AM 01/15/2023    9:29 AM 12/20/2022    9:10 AM  BMP  Glucose 70 - 99 mg/dL 87  83  92   BUN 8 - 27 mg/dL 18  15  15    Creatinine 0.57 - 1.00 mg/dL 8.75  6.43  3.29   BUN/Creat Ratio 12 - 28 18  SEE NOTE:    Sodium 134 - 144 mmol/L 142  141  141   Potassium 3.5 - 5.2 mmol/L 3.6  3.9  3.5   Chloride 96 - 106 mmol/L 102  101  103   CO2 20 - 29 mmol/L 25  31  32   Calcium 8.7 - 10.3 mg/dL 51.8  84.1  9.8   Stable BMET     Latest Ref Rng & Units 03/22/2023   11:13 AM 01/15/2023    9:29 AM 12/20/2022    9:10 AM  Hepatic Function  Total Protein 6.0 - 8.5 g/dL 6.6  6.9  6.7   Albumin 3.9 - 4.9 g/dL 4.5   4.3   AST 0 - 40 IU/L 30  28  26    ALT 0 - 32 IU/L 25  22  22    Alk Phosphatase 44 - 121 IU/L 52   42   Total Bilirubin 0.0 - 1.2 mg/dL 0.7  1.0  0.9   Normal LFTs   Assessment & Plan:   Discussion: 67 year old female with sarcoidosis, asthma, osteoporosis, GERD, HLD, HTN who presents for follow-up for sarcoid flare. Has had difficulty tolerating immunosuppressants due to leukopenia on methotrexate and azathioprine.  CT imaging from February 2024 with progressive pulmonary sarcoid so she was transitioned to infliximab. Currently tolerating without issue and overall asymptomatic except for this cold.  Acute URI Prednisone taper Azithromycin  Pulmonary sarcoidosis, refractory  to current immunosuppressants -improved  symptom control --Dx in 11/20/18 via bronchoscopy --06/19/22 Progressive parenchymal changes.  --Reviewed CT 03/2023. Resolved nodular changes in LLL but new GGO/nodules in RUL but very minimal  History of immunosuppression High risk medication management --QuantiFERON-TB 03/12/19: neg  --Prednisone 11/2018>01/2020 --Methotrexate 03/2019>06/2021. D/C'd due to leukopenia --Prednisone 06/2021>08/2021 --Azathioprine 09/09/21>06/19/22. D/C'd due to leukopenia --Started infliximab 07/12/22.  --Consider tapering to every 3 months in 06/2024 --Labs reviewed  Sarcoid Monitoring --Recent chest imaging reviewed. --Annual PFTs.  Last PFTs 03/17/20 --Annual ophthalmology exam.  Last visit on 06/06/21. No active disease. Scheduled for 06/2022 --MR Cardiac 11/28/19. No active disease --Monitor routine labs as needed: CBC with diff, CMET, 1, 25 and 25 hydroxy vitamin D, urinary calcium  Asthma  --CONTINUE Advair 230-21 TWO puffs TWICE a day. Ok to trial decreasing dose to one puff in morning and evening. --CONTINUE Albuterol TWO puffs AS NEEDED for shortness of breath, chest tightness, cough or wheezing  Health Maintenance Immunization History  Administered Date(s) Administered   Fluad Quad(high Dose 65+) 12/30/2021   Influenza Inj Mdck Quad Pf 02/13/2014, 01/21/2021   Influenza Inj Mdck Quad With Preservative 02/13/2014   Influenza Split 02/13/2013   Influenza, Seasonal, Injecte, Preservative Fre 02/13/2013   Influenza,inj,Quad PF,6+ Mos 12/21/2016, 12/25/2017, 01/02/2019, 01/08/2020, 01/17/2022   Influenza,trivalent, recombinat, inj, PF 02/09/2011, 02/15/2012   Influenza-Unspecified 02/13/2013, 01/22/2023   PFIZER(Purple Top)SARS-COV-2 Vaccination 07/03/2019, 08/03/2019, 02/26/2020, 01/21/2021, 12/29/2022   PNEUMOCOCCAL CONJUGATE-20 01/10/2021   Respiratory Syncytial Virus Vaccine,Recomb Aduvanted(Arexvy) 01/26/2022   Tdap 03/16/2009, 01/15/2020   Zoster  Recombinant(Shingrix) 11/22/2021, 02/08/2022    Orders Placed This Encounter  Procedures   Pulmonary function test    Standing Status:   Future    Standing Expiration Date:   04/01/2024    Order Specific Question:   Where should this test be performed?    Answer:   [facility]    Order Specific Question:   Full PFT: includes the following: basic spirometry, spirometry pre & post bronchodilator, diffusion capacity (DLCO), lung volumes    Answer:   Full PFT   Meds ordered this encounter  Medications   azithromycin (ZITHROMAX) 250 MG tablet    Sig: Take two tablets on day 1, then one tablet daily on day 2-5.    Dispense:  6 tablet    Refill:  0   predniSONE (DELTASONE) 10 MG tablet    Sig: Take 4 tablets (40 mg total) by mouth daily with breakfast for 2 days, THEN 3 tablets (30 mg total) daily with breakfast for 2 days, THEN 2 tablets (20 mg total) daily with breakfast for 2 days, THEN 1 tablet (10 mg total) daily with breakfast for 2 days.    Dispense:  20 tablet    Refill:  0   fluticasone-salmeterol (ADVAIR HFA) 230-21 MCG/ACT inhaler    Sig: Inhale 2 puffs into the lungs 2 (two) times daily.    Dispense:  1 each    Refill:  11    Return in about 4 months (around 08/01/2023).   I have spent a total time of 36-minutes on the day of the appointment including chart review, data review, collecting history, coordinating care and discussing medical diagnosis and plan with the patient/family. Past medical history, allergies, medications were reviewed. Pertinent imaging, labs and tests included in this note have been reviewed and interpreted independently by me.  Kawehi Hostetter Mechele Collin, MD [facility] 04/02/2023 4:18 PM  Office Number [phone]

## 2023-04-05 DIAGNOSIS — L720 Epidermal cyst: Secondary | ICD-10-CM | POA: Diagnosis not present

## 2023-04-05 DIAGNOSIS — C44519 Basal cell carcinoma of skin of other part of trunk: Secondary | ICD-10-CM | POA: Diagnosis not present

## 2023-04-05 DIAGNOSIS — L570 Actinic keratosis: Secondary | ICD-10-CM | POA: Diagnosis not present

## 2023-04-05 DIAGNOSIS — Z85828 Personal history of other malignant neoplasm of skin: Secondary | ICD-10-CM | POA: Diagnosis not present

## 2023-04-05 DIAGNOSIS — C4441 Basal cell carcinoma of skin of scalp and neck: Secondary | ICD-10-CM | POA: Diagnosis not present

## 2023-04-05 DIAGNOSIS — L578 Other skin changes due to chronic exposure to nonionizing radiation: Secondary | ICD-10-CM | POA: Diagnosis not present

## 2023-04-05 DIAGNOSIS — D0462 Carcinoma in situ of skin of left upper limb, including shoulder: Secondary | ICD-10-CM | POA: Diagnosis not present

## 2023-04-05 DIAGNOSIS — D2262 Melanocytic nevi of left upper limb, including shoulder: Secondary | ICD-10-CM | POA: Diagnosis not present

## 2023-04-11 ENCOUNTER — Ambulatory Visit: Payer: Medicare Other

## 2023-04-11 VITALS — BP 121/81 | HR 70 | Temp 98.2°F | Resp 16 | Ht 63.0 in | Wt 124.6 lb

## 2023-04-11 DIAGNOSIS — D869 Sarcoidosis, unspecified: Secondary | ICD-10-CM

## 2023-04-11 DIAGNOSIS — Z79899 Other long term (current) drug therapy: Secondary | ICD-10-CM | POA: Diagnosis not present

## 2023-04-11 MED ORDER — SODIUM CHLORIDE 0.9 % IV SOLN
5.0000 mg/kg | Freq: Once | INTRAVENOUS | Status: AC
  Administered 2023-04-11: 300 mg via INTRAVENOUS
  Filled 2023-04-11: qty 30

## 2023-04-11 MED ORDER — DIPHENHYDRAMINE HCL 25 MG PO CAPS
25.0000 mg | ORAL_CAPSULE | Freq: Once | ORAL | Status: AC
  Administered 2023-04-11: 25 mg via ORAL
  Filled 2023-04-11: qty 1

## 2023-04-11 MED ORDER — ACETAMINOPHEN 325 MG PO TABS
650.0000 mg | ORAL_TABLET | Freq: Once | ORAL | Status: AC
  Administered 2023-04-11: 650 mg via ORAL
  Filled 2023-04-11: qty 2

## 2023-04-11 NOTE — Progress Notes (Signed)
Diagnosis: Sarcoidosis  Provider:  Chilton Greathouse MD  Procedure: IV Infusion  IV Type: Peripheral, IV Location: R Forearm  Avsola (infliximab-axxq), Dose: 300 mg  Infusion Start Time: 0929  Infusion Stop Time: 1144  Post Infusion IV Care: Peripheral IV Discontinued  Discharge: Condition: Good, Destination: Home . AVS Declined  Performed by:  Wyvonne Lenz, RN

## 2023-04-12 DIAGNOSIS — C4441 Basal cell carcinoma of skin of scalp and neck: Secondary | ICD-10-CM | POA: Diagnosis not present

## 2023-04-12 DIAGNOSIS — D0462 Carcinoma in situ of skin of left upper limb, including shoulder: Secondary | ICD-10-CM | POA: Diagnosis not present

## 2023-04-12 DIAGNOSIS — C44519 Basal cell carcinoma of skin of other part of trunk: Secondary | ICD-10-CM | POA: Diagnosis not present

## 2023-05-15 ENCOUNTER — Ambulatory Visit: Payer: Medicare Other | Admitting: Internal Medicine

## 2023-05-15 ENCOUNTER — Encounter: Payer: Self-pay | Admitting: Internal Medicine

## 2023-05-15 VITALS — BP 120/74 | HR 77 | Ht 63.0 in | Wt 120.0 lb

## 2023-05-15 DIAGNOSIS — M810 Age-related osteoporosis without current pathological fracture: Secondary | ICD-10-CM

## 2023-05-15 NOTE — Patient Instructions (Signed)
Calcium 1000-1200 mg daily  

## 2023-05-15 NOTE — Progress Notes (Signed)
 Name: Tamara Myers  MRN/ DOB: [id number], 07-13-1955    Age/ Sex: 68 y.o., female     PCP: Perri Ronal PARAS, MD   Reason for Endocrinology Evaluation: Osteoporosis     Initial Endocrinology Clinic Visit: 05/03/2020    PATIENT IDENTIFIER: Tamara Myers is a 68 y.o., female with a past medical history of HTN, GERD, osteoporosis, and asthma. She has followed with [facility] since 05/03/2020 for consultative assistance with management of her osteoporosis.   HISTORICAL SUMMARY: The patient was first diagnosed with osteoporosis in 2021 , with a T-score of -2.7 at the AP spine  Secondary cause: prednisone  2020-2021, for sarcoidosis, and Vit-D def.  Fractures:  T-spine in 2021 (nontraumatic), and several fingers as a child right thumb (2015-with injuries).   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 Kassie from 05/2020 until 07/2021 SUBJECTIVE:    Today (05/15/2023):  Tamara Myers is here for a follow-up on osteoporosis.   Patient continues to follow-up with pulmonary for pulmonary sarcoidosis, refractory to current immunosuppressants, she is also on infliximab  infusions. 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   She is on vitamin D3  daily Calcium  600 mg daily       HISTORY:  Past Medical History:  Past Medical History:  Diagnosis Date   Cancer (HCC)    basal cell carcinoma   Cataract    bilateral   Complication of anesthesia    slow to wake up   Dyspnea    on exertion   GERD (gastroesophageal reflux disease)    on meds   Hyperlipidemia    on meds   Hypertension    on meds   Melanoma (HCC) 2018   left upper arm   Moderate persistent asthma without  complication 04/04/2018   on meds   Osteoporosis    confirmed by bone scan   PONV (postoperative nausea and vomiting)    Post menopausal syndrome    Pulmonary sarcoidosis (HCC)    tx'd   Past Surgical History:  Past Surgical History:  Procedure Laterality Date   BREAST CYST ASPIRATION Right    CATARACT EXTRACTION, BILATERAL Bilateral 10/2020   CESAREAN SECTION     OPEN REDUCTION INTERNAL FIXATION (ORIF) METACARPAL Right 11/24/2014   Procedure: OPEN REDUCTION INTERNAL FIXATION (ORIF) RIGHT THUMB;  Surgeon: Prentice Pagan, MD;  Location: [facility] OR;  Service: Orthopedics;  Laterality: Right;   skin shave biopsy  02/08/2021   left mid lateral posterior arm   thumb surgery Right 2014   VIDEO BRONCHOSCOPY N/A 11/20/2018   Procedure: Video Bronchoscopy With Fluoro;  Surgeon: Brenna Adine CROME, DO;  Location: [facility] OR;  Service: Thoracic;  Laterality: N/A;   VIDEO BRONCHOSCOPY WITH ENDOBRONCHIAL ULTRASOUND N/A 11/20/2018   Procedure: VIDEO BRONCHOSCOPY WITH ENDOBRONCHIAL ULTRASOUND;  Surgeon: Brenna Adine CROME, DO;  Location: [facility] OR;  Service: Thoracic;  Laterality: N/A;   Social History:  reports that she has never smoked. She has never used smokeless tobacco. She reports that she does not drink alcohol and does not use drugs. Family History:  Family History  Problem Relation Age of Onset   Osteoporosis Mother    Diabetes Father  Heart disease Father    Hyperlipidemia Father    Hypertension Father    Pulmonary fibrosis Father    Asthma Daughter    Breast cancer Neg Hx    Allergic rhinitis Neg Hx    Eczema Neg Hx    Urticaria Neg Hx    Angioedema Neg Hx    Colon polyps Neg Hx    Colon cancer Neg Hx    Esophageal cancer Neg Hx    Rectal cancer Neg Hx    Stomach cancer Neg Hx      HOME MEDICATIONS: Allergies as of 05/15/2023   No Known Allergies      Medication List        Accurate as of May 15, 2023 12:52 PM. If you have any questions, ask your nurse or doctor.           albuterol  108 (90 Base) MCG/ACT inhaler Commonly known as: VENTOLIN  HFA Inhale 2 puffs into the lungs every 6 (six) hours as needed for wheezing or shortness of breath.   amLODipine  5 MG tablet Commonly known as: NORVASC  Take 1 tablet (5 mg total) by mouth daily.   AVSOLA  IV Inject into the vein. Infuse 5 mg/kg at weeks 0, 2, and 6 then maintenance of 5 mg/kg every 8 weeks thereafter (receives at [facility] infusion center)   azithromycin  250 MG tablet Commonly known as: ZITHROMAX  Take two tablets on day 1, then one tablet daily on day 2-5.   calcium  carbonate 1500 (600 Ca) MG Tabs tablet Commonly known as: OSCAL Take by mouth 2 (two) times daily with a meal.   Comirnaty syringe Generic drug: COVID-19 mRNA vaccine (Pfizer)   fluticasone -salmeterol 230-21 MCG/ACT inhaler Commonly known as: Advair  HFA Inhale 2 puffs into the lungs 2 (two) times daily.   MUCINEX DM PO Take by mouth.   pantoprazole  40 MG tablet Commonly known as: PROTONIX  TAKE 1 TABLET BY MOUTH DAILY   potassium chloride  SA 20 MEQ tablet Commonly known as: KLOR-CON  M Take 1 tablet (20 mEq total) by mouth daily.   rosuvastatin  10 MG tablet Commonly known as: CRESTOR  Take 1 tablet (10 mg total) by mouth daily.   triamterene -hydrochlorothiazide 37.5-25 MG tablet Commonly known as: MAXZIDE-25 Take 1 tablet by mouth daily.          OBJECTIVE:   PHYSICAL EXAM: VS: BP 120/74 (BP Location: Left Arm, Patient Position: Sitting, Cuff Size: Normal)   Pulse 77   Ht 5' 3 (1.6 m)   Wt 120 lb (54.4 kg)   SpO2 98%   BMI 21.26 kg/m    EXAM: General: Pt appears well and is in NAD  Neck: General: Supple without adenopathy. Thyroid : No goiter or nodules appreciated  Lungs: Clear with good BS bilat   Heart: Auscultation: RRR.  Extremities:  BL LE: No pretibial edema  Mental Status: Judgment, insight: Intact Orientation: Oriented to time, place, and person Mood and affect: No depression, anxiety, or  agitation     DATA REVIEWED:   Latest Reference Range & Units 03/22/23 11:13  Sodium 134 - 144 mmol/L 142  Potassium 3.5 - 5.2 mmol/L 3.6  Chloride 96 - 106 mmol/L 102  CO2 20 - 29 mmol/L 25  Glucose 70 - 99 mg/dL 87  BUN 8 - 27 mg/dL 18  Creatinine 9.42 - 8.99 mg/dL 9.00  Calcium  8.7 - 10.3 mg/dL 89.7  BUN/Creatinine Ratio 12 - 28  18  eGFR >59 mL/min/1.73 63  Alkaline Phosphatase 44 - 121 IU/L  52  Albumin 3.9 - 4.9 g/dL 4.5  AST 0 - 40 IU/L 30    [facility]   ASSESSMENT: The BMD measured at Femur Total Left is 0.723 g/cm2 with a T-score of -2.3. This patient is considered osteopenic/low bone mass according to World Health Organization [facility]) criteria.   The quality of the exam is good. L2, L3 was excluded due to being excluded on previous exam.   Patient does not meet criteria for FRAX due to recent use of Reclast .   Site Region Measured Date Measured Age YA BMD Significant CHANGE T-score AP Spine L1-L4 (L2,L3) 05/03/2021 65.0 -1.9 0.932 g/cm2 AP Spine L1-L4 (L2,L3) 01/27/2020 63.7 -2.1 0.927 g/cm2   DualFemur Total Left 05/03/2021 65.0 -2.3 0.723 g/cm2 DualFemur Total Left 01/27/2020 63.7 -2.3 0.722 g/cm2   DualFemur Total Mean 05/03/2021 65.0 -2.2 0.733 g/cm2 DualFemur Total Mean 01/27/2020 63.7 -2.2 0.731 g/cm2    ASSESSMENT / PLAN / RECOMMENDATIONS:   Osteoporosis  - Emphasized importance of optimizing calcium  and vitamin D  intake as well as weight bearing exercise - She has completed 3 doses of zoledronic  acid between 2022 and 2024 -Repeat DXA in 2023 showed improvement of BMD at the spine with a T-score -2.7 to -1.9, with stability at the hips. -Will proceed with repeat DXA scan this year  Medications  Increase calcium  600 mg BID Continue vitamin D  daily    F/U in 1 yr    Signed electronically by: Stefano Redgie Butts, MD  [facility]  [facility] [address] [place], [place]  [address] Phone: [phone] FAX: [phone]      CC: Perri Ronal PARAS, MD [address] [place] [address] Phone: [phone]  Fax: [phone]   Return to Endocrinology clinic as below: Future Appointments  Date Time Provider Department Center  05/15/2023  1:00 PM Jerusalen Mateja, Donell Redgie, MD [id number] None  06/06/2023  9:00 AM [id number] None  01/18/2024  9:00 AM [facility]  01/21/2024 11:00 AM Baxley, Ronal PARAS, MD [facility]

## 2023-05-28 DIAGNOSIS — K080 Exfoliation of teeth due to systemic causes: Secondary | ICD-10-CM | POA: Diagnosis not present

## 2023-06-04 DIAGNOSIS — K080 Exfoliation of teeth due to systemic causes: Secondary | ICD-10-CM | POA: Diagnosis not present

## 2023-06-06 ENCOUNTER — Ambulatory Visit: Payer: Medicare Other

## 2023-06-06 ENCOUNTER — Other Ambulatory Visit: Payer: Self-pay

## 2023-06-06 VITALS — BP 133/85 | HR 72 | Temp 98.2°F | Resp 16 | Ht 63.0 in | Wt 121.6 lb

## 2023-06-06 DIAGNOSIS — Z79899 Other long term (current) drug therapy: Secondary | ICD-10-CM

## 2023-06-06 DIAGNOSIS — D869 Sarcoidosis, unspecified: Secondary | ICD-10-CM

## 2023-06-06 MED ORDER — SODIUM CHLORIDE 0.9 % IV SOLN
5.0000 mg/kg | Freq: Once | INTRAVENOUS | Status: AC
  Administered 2023-06-06: 300 mg via INTRAVENOUS
  Filled 2023-06-06: qty 30

## 2023-06-06 MED ORDER — DIPHENHYDRAMINE HCL 25 MG PO CAPS
25.0000 mg | ORAL_CAPSULE | Freq: Once | ORAL | Status: AC
  Administered 2023-06-06: 25 mg via ORAL
  Filled 2023-06-06: qty 1

## 2023-06-06 MED ORDER — ACETAMINOPHEN 325 MG PO TABS
650.0000 mg | ORAL_TABLET | Freq: Once | ORAL | Status: AC
  Administered 2023-06-06: 650 mg via ORAL
  Filled 2023-06-06: qty 2

## 2023-06-06 NOTE — Progress Notes (Signed)
 Diagnosis: Sarcoidosis  Provider:  Lonna Coder MD  Procedure: IV Infusion  IV Type: Peripheral, IV Location: L Forearm  Avsola  (infliximab -axxq), Dose: 300 mg  Infusion Start Time: 0927  Infusion Stop Time: 1140  Post Infusion IV Care: Peripheral IV Discontinued  Discharge: Condition: Good, Destination: Home . AVS Declined  Performed by:  Anaclara Acklin, RN

## 2023-06-07 LAB — COMPREHENSIVE METABOLIC PANEL
AG Ratio: 2 (calc) (ref 1.0–2.5)
ALT: 20 U/L (ref 6–29)
AST: 29 U/L (ref 10–35)
Albumin: 4.3 g/dL (ref 3.6–5.1)
Alkaline phosphatase (APISO): 43 U/L (ref 37–153)
BUN: 14 mg/dL (ref 7–25)
CO2: 29 mmol/L (ref 20–32)
Calcium: 10.2 mg/dL (ref 8.6–10.4)
Chloride: 104 mmol/L (ref 98–110)
Creat: 0.85 mg/dL (ref 0.50–1.05)
Globulin: 2.1 g/dL (ref 1.9–3.7)
Glucose, Bld: 92 mg/dL (ref 65–99)
Potassium: 3.6 mmol/L (ref 3.5–5.3)
Sodium: 141 mmol/L (ref 135–146)
Total Bilirubin: 0.7 mg/dL (ref 0.2–1.2)
Total Protein: 6.4 g/dL (ref 6.1–8.1)

## 2023-06-07 LAB — CBC WITH DIFFERENTIAL/PLATELET
Absolute Lymphocytes: 902 {cells}/uL (ref 850–3900)
Absolute Monocytes: 356 {cells}/uL (ref 200–950)
Basophils Absolute: 11 {cells}/uL (ref 0–200)
Basophils Relative: 0.4 %
Eosinophils Absolute: 70 {cells}/uL (ref 15–500)
Eosinophils Relative: 2.5 %
HCT: 39.5 % (ref 35.0–45.0)
Hemoglobin: 12.9 g/dL (ref 11.7–15.5)
MCH: 30.1 pg (ref 27.0–33.0)
MCHC: 32.7 g/dL (ref 32.0–36.0)
MCV: 92.1 fL (ref 80.0–100.0)
MPV: 12.6 fL — ABNORMAL HIGH (ref 7.5–12.5)
Monocytes Relative: 12.7 %
Neutro Abs: 1462 {cells}/uL — ABNORMAL LOW (ref 1500–7800)
Neutrophils Relative %: 52.2 %
Platelets: 213 10*3/uL (ref 140–400)
RBC: 4.29 10*6/uL (ref 3.80–5.10)
RDW: 12.4 % (ref 11.0–15.0)
Total Lymphocyte: 32.2 %
WBC: 2.8 10*3/uL — ABNORMAL LOW (ref 3.8–10.8)

## 2023-06-08 ENCOUNTER — Encounter (HOSPITAL_BASED_OUTPATIENT_CLINIC_OR_DEPARTMENT_OTHER): Payer: Self-pay | Admitting: Pulmonary Disease

## 2023-06-11 DIAGNOSIS — H04123 Dry eye syndrome of bilateral lacrimal glands: Secondary | ICD-10-CM | POA: Diagnosis not present

## 2023-06-11 DIAGNOSIS — D869 Sarcoidosis, unspecified: Secondary | ICD-10-CM | POA: Diagnosis not present

## 2023-06-11 DIAGNOSIS — H524 Presbyopia: Secondary | ICD-10-CM | POA: Diagnosis not present

## 2023-06-11 DIAGNOSIS — Z961 Presence of intraocular lens: Secondary | ICD-10-CM | POA: Diagnosis not present

## 2023-06-11 DIAGNOSIS — H35362 Drusen (degenerative) of macula, left eye: Secondary | ICD-10-CM | POA: Diagnosis not present

## 2023-07-09 ENCOUNTER — Telehealth: Payer: Self-pay | Admitting: Pharmacy Technician

## 2023-07-09 NOTE — Telephone Encounter (Signed)
 Auth Submission: APPROVED Site of care: Site of care: [facility] Payer: BCBS Medication & CPT/J Code(s) submitted: Avsola (infliximab-axxq) [id number] Route of submission (phone, fax, portal):  Phone # Fax # Auth type: Buy/Bill PB Units/visits requested: 5MG /KG 300MG  Q8WKS Reference number: [id number] Approval from: 07/06/23 to 07/05/24

## 2023-08-01 ENCOUNTER — Other Ambulatory Visit: Payer: Self-pay

## 2023-08-01 ENCOUNTER — Ambulatory Visit: Payer: Medicare Other

## 2023-08-01 VITALS — BP 128/80 | HR 69 | Temp 97.8°F | Resp 20 | Ht 63.0 in | Wt 120.6 lb

## 2023-08-01 DIAGNOSIS — D869 Sarcoidosis, unspecified: Secondary | ICD-10-CM | POA: Diagnosis not present

## 2023-08-01 DIAGNOSIS — Z79899 Other long term (current) drug therapy: Secondary | ICD-10-CM

## 2023-08-01 MED ORDER — DIPHENHYDRAMINE HCL 25 MG PO CAPS
25.0000 mg | ORAL_CAPSULE | Freq: Once | ORAL | Status: AC
  Administered 2023-08-01: 25 mg via ORAL
  Filled 2023-08-01: qty 1

## 2023-08-01 MED ORDER — ACETAMINOPHEN 325 MG PO TABS
650.0000 mg | ORAL_TABLET | Freq: Once | ORAL | Status: AC
  Administered 2023-08-01: 650 mg via ORAL
  Filled 2023-08-01: qty 2

## 2023-08-01 MED ORDER — SODIUM CHLORIDE 0.9 % IV SOLN
5.0000 mg/kg | Freq: Once | INTRAVENOUS | Status: AC
  Administered 2023-08-01: 300 mg via INTRAVENOUS
  Filled 2023-08-01: qty 30

## 2023-08-01 NOTE — Progress Notes (Signed)
 Diagnosis: Sarcoidosis  Provider:  Chilton Greathouse MD  Procedure: IV Infusion  IV Type: Peripheral, IV Location: R Antecubital  Avsola (infliximab-axxq), Dose: 300 mg  Infusion Start Time: 0930  Infusion Stop Time: 1147  Post Infusion IV Care: Peripheral IV Discontinued  Discharge: Condition: Good, Destination: Home . AVS Declined  Performed by:  Garnette Czech, RN

## 2023-08-02 LAB — CBC WITH DIFFERENTIAL/PLATELET
Absolute Lymphocytes: 1096 {cells}/uL (ref 850–3900)
Absolute Monocytes: 438 {cells}/uL (ref 200–950)
Basophils Absolute: 11 {cells}/uL (ref 0–200)
Basophils Relative: 0.3 %
Eosinophils Absolute: 109 {cells}/uL (ref 15–500)
Eosinophils Relative: 3.1 %
HCT: 39.9 % (ref 35.0–45.0)
Hemoglobin: 13 g/dL (ref 11.7–15.5)
MCH: 29.7 pg (ref 27.0–33.0)
MCHC: 32.6 g/dL (ref 32.0–36.0)
MCV: 91.3 fL (ref 80.0–100.0)
MPV: 12.5 fL (ref 7.5–12.5)
Monocytes Relative: 12.5 %
Neutro Abs: 1848 {cells}/uL (ref 1500–7800)
Neutrophils Relative %: 52.8 %
Platelets: 247 10*3/uL (ref 140–400)
RBC: 4.37 10*6/uL (ref 3.80–5.10)
RDW: 12.2 % (ref 11.0–15.0)
Total Lymphocyte: 31.3 %
WBC: 3.5 10*3/uL — ABNORMAL LOW (ref 3.8–10.8)

## 2023-08-02 LAB — COMPREHENSIVE METABOLIC PANEL WITH GFR
AG Ratio: 2.1 (calc) (ref 1.0–2.5)
ALT: 22 U/L (ref 6–29)
AST: 34 U/L (ref 10–35)
Albumin: 4.4 g/dL (ref 3.6–5.1)
Alkaline phosphatase (APISO): 57 U/L (ref 37–153)
BUN: 15 mg/dL (ref 7–25)
CO2: 30 mmol/L (ref 20–32)
Calcium: 9.6 mg/dL (ref 8.6–10.4)
Chloride: 101 mmol/L (ref 98–110)
Creat: 0.95 mg/dL (ref 0.50–1.05)
Globulin: 2.1 g/dL (ref 1.9–3.7)
Glucose, Bld: 80 mg/dL (ref 65–99)
Potassium: 3.4 mmol/L — ABNORMAL LOW (ref 3.5–5.3)
Sodium: 141 mmol/L (ref 135–146)
Total Bilirubin: 0.5 mg/dL (ref 0.2–1.2)
Total Protein: 6.5 g/dL (ref 6.1–8.1)
eGFR: 66 mL/min/{1.73_m2} (ref >=60)

## 2023-08-03 LAB — QUANTIFERON-TB GOLD PLUS
Mitogen-NIL: 2.44 [IU]/mL
NIL: 0.03 [IU]/mL
QuantiFERON-TB Gold Plus: NEGATIVE
TB1-NIL: 0.01 [IU]/mL
TB2-NIL: 0 [IU]/mL

## 2023-08-06 ENCOUNTER — Encounter (HOSPITAL_BASED_OUTPATIENT_CLINIC_OR_DEPARTMENT_OTHER): Payer: Self-pay | Admitting: Pulmonary Disease

## 2023-08-06 ENCOUNTER — Ambulatory Visit (HOSPITAL_BASED_OUTPATIENT_CLINIC_OR_DEPARTMENT_OTHER): Payer: Medicare Other | Admitting: Pulmonary Disease

## 2023-08-06 ENCOUNTER — Other Ambulatory Visit (HOSPITAL_BASED_OUTPATIENT_CLINIC_OR_DEPARTMENT_OTHER): Payer: Self-pay

## 2023-08-06 ENCOUNTER — Other Ambulatory Visit: Payer: Self-pay

## 2023-08-06 ENCOUNTER — Encounter: Payer: Self-pay | Admitting: Pulmonary Disease

## 2023-08-06 VITALS — BP 116/72 | HR 86 | Ht 63.5 in | Wt 118.8 lb

## 2023-08-06 DIAGNOSIS — I10 Essential (primary) hypertension: Secondary | ICD-10-CM

## 2023-08-06 DIAGNOSIS — Z79899 Other long term (current) drug therapy: Secondary | ICD-10-CM

## 2023-08-06 DIAGNOSIS — D869 Sarcoidosis, unspecified: Secondary | ICD-10-CM

## 2023-08-06 LAB — PULMONARY FUNCTION TEST
DL/VA % pred: 114 %
DL/VA: 4.79 ml/min/mmHg/L
DLCO cor % pred: 93 %
DLCO cor: 18.16 ml/min/mmHg
DLCO unc % pred: 92 %
DLCO unc: 17.93 ml/min/mmHg
FEF 25-75 Post: 1.63 L/s
FEF 25-75 Pre: 1.71 L/s
FEF2575-%Change-Post: -4 %
FEF2575-%Pred-Post: 81 %
FEF2575-%Pred-Pre: 84 %
FEV1-%Change-Post: 3 %
FEV1-%Pred-Post: 88 %
FEV1-%Pred-Pre: 85 %
FEV1-Post: 2.05 L
FEV1-Pre: 1.97 L
FEV1FVC-%Change-Post: 4 %
FEV1FVC-%Pred-Pre: 98 %
FEV6-%Change-Post: 2 %
FEV6-%Pred-Post: 89 %
FEV6-%Pred-Pre: 87 %
FEV6-Post: 2.6 L
FEV6-Pre: 2.54 L
FEV6FVC-%Pred-Post: 104 %
FEV6FVC-%Pred-Pre: 104 %
FVC-%Change-Post: 0 %
FVC-%Pred-Post: 85 %
FVC-%Pred-Pre: 86 %
FVC-Post: 2.6 L
FVC-Pre: 2.62 L
Post FEV1/FVC ratio: 79 %
Post FEV6/FVC ratio: 100 %
Pre FEV1/FVC ratio: 75 %
Pre FEV6/FVC Ratio: 100 %
RV % pred: 90 %
RV: 1.89 L
TLC % pred: 89 %
TLC: 4.43 L

## 2023-08-06 MED ORDER — BUDESONIDE-FORMOTEROL FUMARATE 160-4.5 MCG/ACT IN AERO: 2.0000 | INHALATION_SPRAY | Freq: Two times a day (BID) | RESPIRATORY_TRACT | 5 refills | Status: DC

## 2023-08-06 MED ORDER — AMLODIPINE BESYLATE 5 MG PO TABS: 5.0000 mg | ORAL_TABLET | Freq: Every day | ORAL | 1 refills | Status: DC

## 2023-08-06 MED ORDER — TRIAMTERENE-HCTZ 37.5-25 MG PO TABS: 1.0000 | ORAL_TABLET | Freq: Every day | ORAL | 1 refills | Status: DC

## 2023-08-06 NOTE — Patient Instructions (Signed)
 Full PFT Performed Today

## 2023-08-06 NOTE — Progress Notes (Signed)
 Subjective:   PATIENT ID: Tamara Myers GENDER: female DOB: 09-07-1955, MRN: [id number]   HPI  Chief Complaint  Patient presents with   Follow-up    Sarcoidosis   Reason for Visit: Follow-up sarcoid  [name] is a 68 year old female with sarcoidosis, asthma, osteoporosis, GERD, HLD, HTN who presents for follow-up  Synopsis: She initially was seen by Dr. Tonia Brooms in 2020 with symptoms of progressive cough and CT demonstrating perilymphatic micro-nodularity and bilateral hilar adenopathy including 1.4 cm hilar lymph node and 1cm RLL nodule consistent with sarcoid. Bronchoscopy on 11/20/18 with granulomatous inflammation. Started on prednisone in August 2020 with clinical improvement and improvement of parenchymal changes and nodularity. Started on methotrexate 10 mg and steroids November 2020. Continue steroids until 01/2020 and maintained only methotrexate with stable respiratory symptoms. Attempted methotrexate taper in fall 2022 however symptoms recurred. Has been controlled on methotrexate on low dosage 5-7.5. Had to discontinue in March 2023 due to leukopenia. Had to be restarted on prednisone. Started on azathioprine May 2023 and currently on reduced dosing due to leukopenia.  04/12/22 Since our last visit she reports improved symptoms however did develop a cough in the last three weeks. This may have been preceded by cold but not sure if it is related. Using albuterol once a week and advair as directed. Cough is sometimes productive. No wheezing. Compliant with azathioprine. Denies side effects (nausea, rash, hair loss, discoloration of urine or tears).  06/01/22 She has continued to have productive cough that has now progressed to green sputum to whitish sputum. Increased frequency. Denies fevers/chills. Compliant with azathioprine. Denies side effects (nausea, rash, hair loss, discoloration of urine or tears)  06/19/22 Since our last visit she completed her antibiotic. Cough  has improved. Denies shortness of breath or wheezing. She has been compliant with low-dose azathioprine but noticed her labs demonstrated leukopenia again.  09/20/22 Since our last visit she was started on Infliximab 07/12/22. Tolerating well. Cough has improved and no longer nocturnal cough. Compliant with Advair. Denies shortness of breath or wheezing.  04/02/23 Since our last visit she has been overall well. She did pick up a cold last week. No fevers, chills. But symptoms persist with cough.  08/06/23 Since our last she is doing well. Stopped taking Advair HFA for the last 3 days to prepare for PFTs with no symptom changes. Denies coughing, wheezing or shortness of breath.   Social History: Audiological scientist for [employer] Father passed from pulmonary fibrosis  Past Medical History:  Diagnosis Date   Cancer (HCC)    basal cell carcinoma   Cataract    bilateral   Complication of anesthesia    slow to wake up   Dyspnea    on exertion   GERD (gastroesophageal reflux disease)    on meds   Hyperlipidemia    on meds   Hypertension    on meds   Melanoma (HCC) 2018   left upper arm   Moderate persistent asthma without complication 04/04/2018   on meds   Osteoporosis    confirmed by bone scan   PONV (postoperative nausea and vomiting)    Post menopausal syndrome    Pulmonary sarcoidosis (HCC)    tx'd     Family History  Problem Relation Age of Onset   Osteoporosis Mother    Diabetes Father    Heart disease Father    Hyperlipidemia Father    Hypertension Father    Pulmonary fibrosis Father  Asthma Daughter    Breast cancer Neg Hx    Allergic rhinitis Neg Hx    Eczema Neg Hx    Urticaria Neg Hx    Angioedema Neg Hx    Colon polyps Neg Hx    Colon cancer Neg Hx    Esophageal cancer Neg Hx    Rectal cancer Neg Hx    Stomach cancer Neg Hx      Social History   Occupational History   Not on file  Tobacco Use   Smoking status: Never   Smokeless  tobacco: Never  Vaping Use   Vaping status: Never Used  Substance and Sexual Activity   Alcohol use: No   Drug use: No   Sexual activity: Not on file    No Known Allergies   Outpatient Medications Prior to Visit  Medication Sig Dispense Refill   albuterol (VENTOLIN HFA) 108 (90 Base) MCG/ACT inhaler Inhale 2 puffs into the lungs every 6 (six) hours as needed for wheezing or shortness of breath. 18 g 1   amLODipine (NORVASC) 5 MG tablet Take 1 tablet (5 mg total) by mouth daily. 90 tablet 1   calcium carbonate (OSCAL) 1500 (600 Ca) MG TABS tablet Take by mouth 2 (two) times daily with a meal.     COMIRNATY syringe      fluticasone-salmeterol (ADVAIR HFA) 230-21 MCG/ACT inhaler Inhale 2 puffs into the lungs 2 (two) times daily. 1 each 11   inFLIXimab-axxq (AVSOLA IV) Inject into the vein. Infuse 5 mg/kg at weeks 0, 2, and 6 then maintenance of 5 mg/kg every 8 weeks thereafter (receives at [facility] infusion center)     pantoprazole (PROTONIX) 40 MG tablet TAKE 1 TABLET BY MOUTH DAILY 90 tablet 3   potassium chloride SA (KLOR-CON M) 20 MEQ tablet Take 1 tablet (20 mEq total) by mouth daily. 90 tablet 3   rosuvastatin (CRESTOR) 10 MG tablet Take 1 tablet (10 mg total) by mouth daily. 90 tablet 3   triamterene-hydrochlorothiazide (MAXZIDE-25) 37.5-25 MG tablet Take 1 tablet by mouth daily. 90 tablet 1   azithromycin (ZITHROMAX) 250 MG tablet Take two tablets on day 1, then one tablet daily on day 2-5. (Patient not taking: Reported on 08/06/2023) 6 tablet 0   Dextromethorphan-guaiFENesin (MUCINEX DM PO) Take by mouth. (Patient not taking: Reported on 08/06/2023)     No facility-administered medications prior to visit.    Review of Systems  Constitutional:  Negative for chills, diaphoresis, fever, malaise/fatigue and weight loss.  HENT:  Negative for congestion.   Respiratory:  Negative for cough, hemoptysis, sputum production, shortness of breath and wheezing.   Cardiovascular:  Negative for  chest pain, palpitations and leg swelling.     Objective:   Vitals:   08/06/23 1122  BP: 116/72  Pulse: 86  SpO2: 98%  Weight: 118 lb 12.8 oz (53.9 kg)  Height: 5' 3.5" (1.613 m)     SpO2: 98 %  Body mass index is 20.71 kg/m.  Physical Exam: General: Well-appearing, no acute distress HENT: [place], AT Eyes: EOMI, no scleral icterus Respiratory: Clear to auscultation bilaterally.  No crackles, wheezing or rales Cardiovascular: RRR, -M/R/G, no JVD Extremities:-Edema,-tenderness Neuro: AAO x4, CNII-XII grossly intact Psych: Normal mood, normal affect  Data Reviewed:  Imaging:  CT Chest 07/02/18 - Bilateral nodularity with perilymphatic distribution including 1cm RLL nodule. Right hilar enlargement.  CT Chest 10/18/18 - Progressive bilateral nodularity including RLL nodule ~7mm  CT Chest 03/10/19 - Improved parenchymal changes with  mild disease in RML, lingular and superior LLL  CT Chest HR 06/30/19 - Improved parenchymal changes in setting of immunosuppression. RLL 5 mm subpleural nodule.  CT Chest HR 07/15/20 - No significant parenchymal abnormalities or consolidations seen. Stable calcified mediastinal and bilateral hilar nodes. New 2mm solid RUL nodule likely benign. Stable peripheral RUL GGO stable and RLL 5mm nodule stable since 06/2019.  CT Chest 06/10/22 - Progressive pulmonary sarcoid with several new scattered irregular nodules including 10 mm LLL nodule and new GGO in RLL and RML, new subpleural consolidation in superior LLL  CT Chest 03/15/23 - New peribronchovascular GGO in RUL with new 7mm noncalcified nodule and new ground glass 5 mm nodule. Resolved left lower lobe nodules. Stable small subpleural consolidation in apical LLL. Calcified mediastinal and bilateral hilar lymph nodes  PFT: 01/23/18 FVC 2.53 (77%) FEV1 2.19 (86%) Ratio 87% Interpretation: No obstructive lung disease. Reduced FVC and FEV1  04/04/18 FVC 2.59 (78%) FEV1 1.93 (76%) Ratio 75% Interpretation:  No obstructive lung disease. Reduced FVC and FEV1.  05/09/18 FVC 2.43 (73%) FEV1 1.6 (63%) Ratio 66% Interpretation: Moderate obstructive lung disease  11/06/18 FVC 2.92 (89%) FEV1 2.04 (74%) Ratio 74  (Pred 78) TLC 93% DLCO 100%. F-V loops suggestive of small airway disease Interpretation: Mild obstructive defect. No significant BD  03/17/20 FVC 2.53 (79%) FEV1 1.96 (80%) Ratio 65  TLC 87% DLCO 101%. Significant BD response in FEV1 Interpretation: Mild obstructive defect with significant bronchodilator response  08/06/23 FVC 2.60 (85%) FEV1 2.05 (88%) Ratio 79  TLC 89% DLCO 92% Interpretation: Normal PFTs      Latest Ref Rng & Units 08/01/2023    8:55 AM 06/06/2023    9:04 AM 03/22/2023   11:13 AM  CBC  WBC 3.8 - 10.8 Thousand/uL 3.5  2.8  3.7   Hemoglobin 11.7 - 15.5 g/dL 96.2  95.2  84.1   Hematocrit 35.0 - 45.0 % 39.9  39.5  41.7   Platelets 140 - 400 Thousand/uL 247  213  247   Improving leukopenia      Latest Ref Rng & Units 08/01/2023    8:55 AM 06/06/2023    9:04 AM 03/22/2023   11:13 AM  BMP  Glucose 65 - 99 mg/dL 80  92  87   BUN 7 - 25 mg/dL 15  14  18    Creatinine 0.50 - 1.05 mg/dL 3.24  4.01  0.27   BUN/Creat Ratio 6 - 22 (calc) SEE NOTE:  SEE NOTE:  18   Sodium 135 - 146 mmol/L 141  141  142   Potassium 3.5 - 5.3 mmol/L 3.4  3.6  3.6   Chloride 98 - 110 mmol/L 101  104  102   CO2 20 - 32 mmol/L 30  29  25    Calcium 8.6 - 10.4 mg/dL 9.6  25.3  66.4   Overall normal electrolytes with borderline low potassium     Latest Ref Rng & Units 08/01/2023    8:55 AM 06/06/2023    9:04 AM 03/22/2023   11:13 AM  Hepatic Function  Total Protein 6.1 - 8.1 g/dL 6.5  6.4  6.6   Albumin 3.9 - 4.9 g/dL   4.5   AST 10 - 35 U/L 34  29  30   ALT 6 - 29 U/L 22  20  25    Alk Phosphatase 44 - 121 IU/L   52   Total Bilirubin 0.2 - 1.2 mg/dL 0.5  0.7  0.7   Normal  LFTs   Assessment & Plan:   Discussion: 68 year old female with sarcoidosis, asthma, osteoporosis, GERD, HLD, HTN who  presents for follow-up sarcoid flare. Has had difficulty tolerating immunosuppressants due to leukopenia on methotrexate and azathioprine. T imaging from February 2024 with progressive pulmonary sarcoid. Currently on infliximab since 07/12/22. CT 03/2023 with peribronchosvascular ground glasse in RUL with new 7 mm noncalcified nodule and new ground glass 5 mm nodule.  Pulmonary sarcoidosis, refractory to current immunosuppressants - improved symptom control --Dx in 11/20/18 via bronchoscopy --06/19/22 Progressive parenchymal changes.  --Previously CT 03/2023. Resolved nodular changes in LLL but new GGO/nodules in RUL but very minimal  >Plan to repeat CT Chest in 01/2024  History of immunosuppression High risk medication management --QuantiFERON-TB 03/12/19: neg  --Prednisone 11/2018>01/2020 --Methotrexate 03/2019>06/2021. D/C'd due to leukopenia --Prednisone 06/2021>08/2021 --Azathioprine 09/09/21>06/19/22. D/C'd due to leukopenia --Started infliximab 07/12/22.  --Consider tapering to every 3 months in 06/2024 --Labs reviewed. Scheduled with infliximab infusions  Sarcoid Monitoring --Recent chest imaging reviewed.  --Annual PFTs.  Last PFTs 08/06/23 --Annual ophthalmology exam.  Last visit on 06/06/21. No active disease. Scheduled for 06/2022 --MR Cardiac 11/28/19. No active disease --Monitor routine labs as needed: CBC with diff, CMET, 1, 25 and 25 hydroxy vitamin D, urinary calcium  Asthma  --PFTs normal with infliximab. OK to trial reduced dosage of inhalers. --CONTINUE Advair 230-21 TWO puffs TWICE a day. Ok to trial decreasing dose to one puff in morning and evening.  >Switch to generic Symbicort 160-4.5 mcg due to cost. (Advair ~$400 and generic Symbicort $252) --CONTINUE Albuterol TWO puffs AS NEEDED for shortness of breath, chest tightness, cough or wheezing  Health Maintenance Immunization History  Administered Date(s) Administered   Fluad Quad(high Dose 65+) 12/30/2021   Influenza Inj  Mdck Quad Pf 02/13/2014, 01/21/2021   Influenza Inj Mdck Quad With Preservative 02/13/2014   Influenza Split 02/13/2013   Influenza, Seasonal, Injecte, Preservative Fre 02/13/2013   Influenza,inj,Quad PF,6+ Mos 12/21/2016, 12/25/2017, 01/02/2019, 01/08/2020, 01/17/2022   Influenza,trivalent, recombinat, inj, PF 02/09/2011, 02/15/2012   Influenza-Unspecified 02/13/2013, 01/22/2023   PFIZER(Purple Top)SARS-COV-2 Vaccination 07/03/2019, 08/03/2019, 02/26/2020, 01/21/2021, 12/29/2022   PNEUMOCOCCAL CONJUGATE-20 01/10/2021   Respiratory Syncytial Virus Vaccine,Recomb Aduvanted(Arexvy) 01/26/2022   Tdap 03/16/2009, 01/15/2020   Zoster Recombinant(Shingrix) 11/22/2021, 02/08/2022    Orders Placed This Encounter  Procedures   CT Chest Wo Contrast    Standing Status:   Future    Expiration Date:   08/05/2024    Scheduling Instructions:     Schedule in 01/2024    Preferred imaging location?:   [facility]   No orders of the defined types were placed in this encounter.   Return in about 6 months (around 02/05/2024) for after CT scan.   I have spent a total time of 40-minutes on the day of the appointment including chart review, data review, collecting history, coordinating care and discussing medical diagnosis and plan with the patient/family. Past medical history, allergies, medications were reviewed. Pertinent imaging, labs and tests included in this note have been reviewed and interpreted independently by me.  Amirra Herling Mechele Collin, MD [facility] 08/06/2023 11:37 AM

## 2023-08-06 NOTE — Patient Instructions (Addendum)
  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 Resolved nodular changes in LLL but new GGO/nodules in RUL but very minimal  >Plan to repeat CT Chest in 01/2024  --Started infliximab 07/12/22.  --Consider tapering to every 3 months in 06/2024 --Labs reviewed. Scheduled with infliximab infusions   Asthma  --PFTs normal with infliximab. OK to trial reduced dosage of inhalers. --CONTINUE Advair 230-21 TWO puffs TWICE a day. Ok to trial decreasing dose to one puff in morning and evening. --CONTINUE Albuterol TWO puffs AS NEEDED for shortness of breath, chest tightness, cough or wheezing

## 2023-08-06 NOTE — Progress Notes (Signed)
 Full PFT Performed Today

## 2023-09-26 ENCOUNTER — Other Ambulatory Visit: Payer: Self-pay

## 2023-09-26 ENCOUNTER — Ambulatory Visit (INDEPENDENT_AMBULATORY_CARE_PROVIDER_SITE_OTHER)

## 2023-09-26 VITALS — BP 132/80 | HR 67 | Temp 98.6°F | Resp 18 | Ht 63.0 in | Wt 118.6 lb

## 2023-09-26 DIAGNOSIS — Z79899 Other long term (current) drug therapy: Secondary | ICD-10-CM | POA: Diagnosis not present

## 2023-09-26 DIAGNOSIS — D869 Sarcoidosis, unspecified: Secondary | ICD-10-CM | POA: Diagnosis not present

## 2023-09-26 MED ORDER — ACETAMINOPHEN 325 MG PO TABS
650.0000 mg | ORAL_TABLET | Freq: Once | ORAL | Status: AC
  Administered 2023-09-26: 650 mg via ORAL
  Filled 2023-09-26: qty 2

## 2023-09-26 MED ORDER — DIPHENHYDRAMINE HCL 25 MG PO CAPS
25.0000 mg | ORAL_CAPSULE | Freq: Once | ORAL | Status: AC
  Administered 2023-09-26: 25 mg via ORAL
  Filled 2023-09-26: qty 1

## 2023-09-26 MED ORDER — SODIUM CHLORIDE 0.9 % IV SOLN
5.0000 mg/kg | Freq: Once | INTRAVENOUS | Status: AC
  Administered 2023-09-26: 300 mg via INTRAVENOUS
  Filled 2023-09-26: qty 30

## 2023-09-26 NOTE — Progress Notes (Signed)
 Diagnosis: Sarcoidosis  Provider:  Mannam, Praveen MD  Procedure: IV Infusion  IV Type: Peripheral, IV Location: L Forearm  Avsola  (infliximab -axxq), Dose: 300 mg  Infusion Start Time: 0949  Infusion Stop Time: 1202  Post Infusion IV Care: Patient declined observation and Peripheral IV Discontinued  Discharge: Condition: Stable, Destination: Home . AVS Declined  Performed by:  Lendel Quant, RN

## 2023-09-27 ENCOUNTER — Ambulatory Visit: Payer: Self-pay | Admitting: Nurse Practitioner

## 2023-09-27 ENCOUNTER — Other Ambulatory Visit (HOSPITAL_BASED_OUTPATIENT_CLINIC_OR_DEPARTMENT_OTHER): Payer: Self-pay

## 2023-09-27 DIAGNOSIS — Z79899 Other long term (current) drug therapy: Secondary | ICD-10-CM

## 2023-09-27 DIAGNOSIS — D84821 Immunodeficiency due to drugs: Secondary | ICD-10-CM

## 2023-09-27 DIAGNOSIS — D869 Sarcoidosis, unspecified: Secondary | ICD-10-CM

## 2023-09-27 LAB — COMPREHENSIVE METABOLIC PANEL WITH GFR
AG Ratio: 1.9 (calc) (ref 1.0–2.5)
ALT: 16 U/L (ref 6–29)
AST: 43 U/L — ABNORMAL HIGH (ref 10–35)
Albumin: 4.3 g/dL (ref 3.6–5.1)
Alkaline phosphatase (APISO): 45 U/L (ref 37–153)
BUN: 14 mg/dL (ref 7–25)
CO2: 29 mmol/L (ref 20–32)
Calcium: 9.9 mg/dL (ref 8.6–10.4)
Chloride: 100 mmol/L (ref 98–110)
Creat: 0.93 mg/dL (ref 0.50–1.05)
Globulin: 2.3 g/dL (ref 1.9–3.7)
Glucose, Bld: 86 mg/dL (ref 65–99)
Potassium: 3.6 mmol/L (ref 3.5–5.3)
Sodium: 140 mmol/L (ref 135–146)
Total Bilirubin: 0.7 mg/dL (ref 0.2–1.2)
Total Protein: 6.6 g/dL (ref 6.1–8.1)
eGFR: 67 mL/min/{1.73_m2} (ref >=60)

## 2023-09-27 LAB — CBC WITH DIFFERENTIAL/PLATELET
Absolute Lymphocytes: 727 {cells}/uL — ABNORMAL LOW (ref 850–3900)
Absolute Monocytes: 526 {cells}/uL (ref 200–950)
Basophils Absolute: 22 {cells}/uL (ref 0–200)
Basophils Relative: 0.6 %
Eosinophils Absolute: 68 {cells}/uL (ref 15–500)
Eosinophils Relative: 1.9 %
HCT: 42.6 % (ref 35.0–45.0)
Hemoglobin: 13.7 g/dL (ref 11.7–15.5)
MCH: 29 pg (ref 27.0–33.0)
MCHC: 32.2 g/dL (ref 32.0–36.0)
MCV: 90.3 fL (ref 80.0–100.0)
MPV: 12.9 fL — ABNORMAL HIGH (ref 7.5–12.5)
Monocytes Relative: 14.6 %
Neutro Abs: 2257 {cells}/uL (ref 1500–7800)
Neutrophils Relative %: 62.7 %
Platelets: 195 10*3/uL (ref 140–400)
RBC: 4.72 10*6/uL (ref 3.80–5.10)
RDW: 11.7 % (ref 11.0–15.0)
Total Lymphocyte: 20.2 %
WBC: 3.6 10*3/uL — ABNORMAL LOW (ref 3.8–10.8)

## 2023-09-27 NOTE — Progress Notes (Signed)
 Pt notified and labs ordered.

## 2023-10-24 ENCOUNTER — Ambulatory Visit

## 2023-10-24 ENCOUNTER — Other Ambulatory Visit

## 2023-10-24 DIAGNOSIS — Z79899 Other long term (current) drug therapy: Secondary | ICD-10-CM

## 2023-10-24 DIAGNOSIS — M810 Age-related osteoporosis without current pathological fracture: Secondary | ICD-10-CM | POA: Diagnosis not present

## 2023-10-24 DIAGNOSIS — D869 Sarcoidosis, unspecified: Secondary | ICD-10-CM

## 2023-10-24 DIAGNOSIS — M8589 Other specified disorders of bone density and structure, multiple sites: Secondary | ICD-10-CM | POA: Diagnosis not present

## 2023-10-24 DIAGNOSIS — Z780 Asymptomatic menopausal state: Secondary | ICD-10-CM | POA: Diagnosis not present

## 2023-10-24 LAB — BASIC METABOLIC PANEL WITH GFR
BUN: 16 mg/dL (ref 6–23)
CO2: 30 meq/L (ref 19–32)
Calcium: 9.6 mg/dL (ref 8.4–10.5)
Chloride: 102 meq/L (ref 96–112)
Creatinine, Ser: 1.01 mg/dL (ref 0.40–1.20)
GFR: 57.6 mL/min — ABNORMAL LOW (ref >60.00)
Glucose, Bld: 96 mg/dL (ref 70–99)
Potassium: 3.7 meq/L (ref 3.5–5.1)
Sodium: 140 meq/L (ref 135–145)

## 2023-10-24 LAB — HEPATIC FUNCTION PANEL
ALT: 16 U/L (ref 0–35)
AST: 34 U/L (ref 0–37)
Albumin: 4.1 g/dL (ref 3.5–5.2)
Alkaline Phosphatase: 42 U/L (ref 39–117)
Bilirubin, Direct: 0.1 mg/dL (ref 0.0–0.3)
Total Bilirubin: 0.4 mg/dL (ref 0.2–1.2)
Total Protein: 6.8 g/dL (ref 6.0–8.3)

## 2023-10-25 ENCOUNTER — Ambulatory Visit: Payer: Self-pay | Admitting: Nurse Practitioner

## 2023-10-25 NOTE — Progress Notes (Signed)
Stable labs  Thanks

## 2023-10-29 ENCOUNTER — Ambulatory Visit: Payer: Self-pay | Admitting: Internal Medicine

## 2023-11-09 ENCOUNTER — Other Ambulatory Visit: Payer: Self-pay

## 2023-11-09 MED ORDER — ROSUVASTATIN CALCIUM 10 MG PO TABS: 10.0000 mg | ORAL_TABLET | Freq: Every day | ORAL | 3 refills | Status: AC

## 2023-11-22 DIAGNOSIS — D485 Neoplasm of uncertain behavior of skin: Secondary | ICD-10-CM | POA: Diagnosis not present

## 2023-11-22 DIAGNOSIS — C44519 Basal cell carcinoma of skin of other part of trunk: Secondary | ICD-10-CM | POA: Diagnosis not present

## 2023-11-22 DIAGNOSIS — L821 Other seborrheic keratosis: Secondary | ICD-10-CM | POA: Diagnosis not present

## 2023-11-22 DIAGNOSIS — D225 Melanocytic nevi of trunk: Secondary | ICD-10-CM | POA: Diagnosis not present

## 2023-11-22 DIAGNOSIS — Z85820 Personal history of malignant melanoma of skin: Secondary | ICD-10-CM | POA: Diagnosis not present

## 2023-11-22 DIAGNOSIS — D2262 Melanocytic nevi of left upper limb, including shoulder: Secondary | ICD-10-CM | POA: Diagnosis not present

## 2023-11-22 DIAGNOSIS — L570 Actinic keratosis: Secondary | ICD-10-CM | POA: Diagnosis not present

## 2023-11-22 DIAGNOSIS — Z85828 Personal history of other malignant neoplasm of skin: Secondary | ICD-10-CM | POA: Diagnosis not present

## 2023-11-23 ENCOUNTER — Ambulatory Visit

## 2023-11-23 ENCOUNTER — Other Ambulatory Visit: Payer: Self-pay

## 2023-11-23 VITALS — BP 110/72 | HR 71 | Temp 98.3°F | Resp 16 | Ht 63.0 in | Wt 118.6 lb

## 2023-11-23 DIAGNOSIS — D869 Sarcoidosis, unspecified: Secondary | ICD-10-CM | POA: Diagnosis not present

## 2023-11-23 DIAGNOSIS — Z79899 Other long term (current) drug therapy: Secondary | ICD-10-CM | POA: Diagnosis not present

## 2023-11-23 LAB — COMPREHENSIVE METABOLIC PANEL WITH GFR
AG Ratio: 2 (calc) (ref 1.0–2.5)
ALT: 16 U/L (ref 6–29)
AST: 43 U/L — ABNORMAL HIGH (ref 10–35)
Albumin: 4.2 g/dL (ref 3.6–5.1)
Alkaline phosphatase (APISO): 44 U/L (ref 37–153)
BUN: 15 mg/dL (ref 7–25)
CO2: 30 mmol/L (ref 20–32)
Calcium: 9.5 mg/dL (ref 8.6–10.4)
Chloride: 101 mmol/L (ref 98–110)
Creat: 0.93 mg/dL (ref 0.50–1.05)
Globulin: 2.1 g/dL (ref 1.9–3.7)
Glucose, Bld: 84 mg/dL (ref 65–99)
Potassium: 3.7 mmol/L (ref 3.5–5.3)
Sodium: 139 mmol/L (ref 135–146)
Total Bilirubin: 0.5 mg/dL (ref 0.2–1.2)
Total Protein: 6.3 g/dL (ref 6.1–8.1)
eGFR: 67 mL/min/1.73m2 (ref >=60)

## 2023-11-23 LAB — CBC WITH DIFFERENTIAL/PLATELET
Absolute Lymphocytes: 539 {cells}/uL — ABNORMAL LOW (ref 850–3900)
Absolute Monocytes: 589 {cells}/uL (ref 200–950)
Basophils Absolute: 20 {cells}/uL (ref 0–200)
Basophils Relative: 0.7 %
Eosinophils Absolute: 29 {cells}/uL (ref 15–500)
Eosinophils Relative: 1 %
HCT: 38 % (ref 35.0–45.0)
Hemoglobin: 12.3 g/dL (ref 11.7–15.5)
MCH: 29.2 pg (ref 27.0–33.0)
MCHC: 32.4 g/dL (ref 32.0–36.0)
MCV: 90.3 fL (ref 80.0–100.0)
MPV: 12.1 fL (ref 7.5–12.5)
Monocytes Relative: 20.3 %
Neutro Abs: 1723 {cells}/uL (ref 1500–7800)
Neutrophils Relative %: 59.4 %
Platelets: 178 Thousand/uL (ref 140–400)
RBC: 4.21 Million/uL (ref 3.80–5.10)
RDW: 11.8 % (ref 11.0–15.0)
Total Lymphocyte: 18.6 %
WBC: 2.9 Thousand/uL — ABNORMAL LOW (ref 3.8–10.8)

## 2023-11-23 MED ORDER — SODIUM CHLORIDE 0.9 % IV SOLN
5.0000 mg/kg | Freq: Once | INTRAVENOUS | Status: AC
  Administered 2023-11-23: 300 mg via INTRAVENOUS
  Filled 2023-11-23: qty 30

## 2023-11-23 MED ORDER — ACETAMINOPHEN 325 MG PO TABS
650.0000 mg | ORAL_TABLET | Freq: Once | ORAL | Status: AC
  Administered 2023-11-23: 650 mg via ORAL
  Filled 2023-11-23: qty 2

## 2023-11-23 MED ORDER — DIPHENHYDRAMINE HCL 25 MG PO CAPS
25.0000 mg | ORAL_CAPSULE | Freq: Once | ORAL | Status: AC
  Administered 2023-11-23: 25 mg via ORAL
  Filled 2023-11-23: qty 1

## 2023-11-23 NOTE — Progress Notes (Signed)
 Diagnosis:   Sarcoidosis    Provider:  Praveen Mannam MD  Procedure: IV Infusion  IV Type: Peripheral, IV Location: R Forearm  Venofer (Iron Sucrose), Dose: 300 mg  Infusion Start Time: 0924  Infusion Stop Time: 1136  Post Infusion IV Care: Peripheral IV Discontinued  Discharge: Condition: Good, Destination: Home . AVS Declined  Performed by:  Donny Childes, RN

## 2023-11-29 ENCOUNTER — Ambulatory Visit: Payer: Self-pay | Admitting: Adult Health

## 2023-11-29 DIAGNOSIS — K080 Exfoliation of teeth due to systemic causes: Secondary | ICD-10-CM | POA: Diagnosis not present

## 2023-12-06 DIAGNOSIS — C44519 Basal cell carcinoma of skin of other part of trunk: Secondary | ICD-10-CM | POA: Diagnosis not present

## 2024-01-01 ENCOUNTER — Other Ambulatory Visit: Payer: Self-pay

## 2024-01-01 MED ORDER — POTASSIUM CHLORIDE CRYS ER 20 MEQ PO TBCR: 20.0000 meq | EXTENDED_RELEASE_TABLET | Freq: Every day | ORAL | 3 refills | Status: AC

## 2024-01-03 ENCOUNTER — Other Ambulatory Visit: Payer: Medicare Other

## 2024-01-18 ENCOUNTER — Other Ambulatory Visit: Payer: Medicare Other

## 2024-01-18 DIAGNOSIS — Z0000 Encounter for general adult medical examination without abnormal findings: Secondary | ICD-10-CM

## 2024-01-18 DIAGNOSIS — E7800 Pure hypercholesterolemia, unspecified: Secondary | ICD-10-CM | POA: Diagnosis not present

## 2024-01-18 DIAGNOSIS — M8580 Other specified disorders of bone density and structure, unspecified site: Secondary | ICD-10-CM

## 2024-01-18 DIAGNOSIS — I10 Essential (primary) hypertension: Secondary | ICD-10-CM

## 2024-01-18 NOTE — Progress Notes (Signed)
 Annual Wellness Visit   Patient Care Team: Baxley, Ronal PARAS, MD as PCP - General (Internal Medicine) [facility]  Visit Date: 01/21/24   Chief Complaint  Patient presents with   Annual Exam    Knot on LLQ, patient noticed at the end of July.    Medicare Wellness   Subjective:  Patient: Tamara Myers, Female DOB: May 10, 1955, 68 y.o. MRN: [id number] Vitals:   01/21/24 1051  BP: 110/80   Tamara Myers is a 68 y.o. Female who presents today for her Annual Wellness Visit. Patient has Essential hypertension, benign; Fibrocystic breast disease; History of dysplastic nevus; History of basal cell carcinoma; Reactive airway disease; Moderate persistent asthma without complication; LPRD (laryngopharyngeal reflux disease); Sarcoidosis; Osteoporosis; High risk medication use; and Rash on their problem list.   She says that she's been doing fine.    History of Hypertension treated with Amlodipine  5 mg daily, Maxzide-25 37.5-25 mg tablets daily. Blood pressure today was normal at 110/80.   History of Hyperlipidemia treated with Rosuvastatin  10 mg daily. 01/18/2024 lipid panel normal.     History of GE reflux treated with Protonix  40 mg daily.   History of asthma treated with albuterol , Advair  inhalers    Sarcoid was diagnosed on bronchoscopy July 2020 with granulomatous inflammation being noted. Started on prednisone  in August 2020. Had improvement. Was started on methotrexate  and steroids November 2020. She continues steroids until October 2021 and maintained only on methotrexate . Methotrexate  taper was attempted in the fall 2022 but symptoms recurred. Had to continue methotrexate  March 2023 due to leukopenia. Had to be restarted on prednisone . Started azathioprine  in May 2023 and started on prednisone  taper as well. Prednisone  was tapered by Dr. Kassie after visit July 19.     Mild neutropenia stable, continue to follow.   History of mild hypokalemia treated with potassium  chloride 20 mEq daily.   Osteoporosis was diagnosed by Dr. Alyce Kassie in 2021 and formerly treated with Reclast .  She is postmenopausal and is a thin white female but prednisone  therapy likely exacerbated her osteoporosis.   Sarcoidosis was discovered by allergist when she was referred there from this office for protracted cough.  She had a CT scan during allergy evaluation which was abnormal.  At that time she had been coughing for some 12 months and had no improvement with Protonix  and Zantac .  ENT has seen her as well.  She had bronchoscopy in July 2020.  She had mediastinal adenopathy on chest CT.  She had transbronchial needle aspiration biopsies.  Cultures were obtained.  No malignant cells identified.  Biopsy showed granulomatous inflammation consistent with sarcoidosis.  Cultures were negative for AFB and fungus.  Followed by pulmonology.    History of dysplastic nevus of right leg seen by Dr. Jadine, Mohs surgeon. Atypical nevus removed from arm as a basal cell carcinoma removed in the past as well.     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 She had those lesions removed by Dr. Devere Forward.   Labs 01/18/2024 WBC 2.7, Neutro Abs 1,450, Absolute lymphocytes 751 Otherwise WNL   10/24/2023 Bone density Left total hip BMD 0.729 T-score -2.2 Z-score -0.9 Osteopenia based on BMD.      03/17/2022 Mammogram no mammographic evidence of malignancy. Repeat in one year   08/17/2021 Colonoscopy  Hemorrhoids found on digital rectal exam. Tortuous left colon. Two 2 to 4 mm polyps in the rectum,Resected and retrieved. Polyps were benign hyperplastic polyps. Granularity in the cecum. Biopsied to rule out dysplasia.  Normal mucosa in the entire examined colon otherwise.  Non- bleeding non-thrombosed external and internal hemorrhoids. Repeat in ten  years.    Vaccine counseling: Influenza vaccine received 01/14/2024    Health Maintenance: Eye exam scheduled for February 2026  Health Maintenance  Topic Date Due   Mammogram  03/17/2024   COVID-19 Vaccine (7 - 2025-26 season) 02/06/2024 (Originally 12/31/2023)   OPHTHALMOLOGY EXAM  06/25/2024 (Originally 12/07/2022)   Fecal DNA (Cologuard)  01/20/2025 (Originally 11/07/2017)   Medicare Annual Wellness (AWV)  01/20/2025   DTaP/Tdap/Td (3 - Td or Tdap) 01/14/2030   Colonoscopy  08/18/2031   Pneumococcal Vaccine: 50+ Years  Completed   Influenza Vaccine  Completed   DEXA SCAN  Completed   Hepatitis C Screening  Completed   Zoster Vaccines- Shingrix  Completed   HPV VACCINES  Aged Out   Meningococcal B Vaccine  Aged Out      Review of Systems  Constitutional:  Negative for fever and malaise/fatigue.  HENT:  Negative for congestion.   Eyes:  Negative for blurred vision.  Respiratory:  Negative for cough and shortness of breath.   Cardiovascular:  Negative for chest pain, palpitations and leg swelling.  Gastrointestinal:  Negative for vomiting.  Musculoskeletal:  Negative for back pain.  Skin:  Negative for rash.  Neurological:  Negative for loss of consciousness and headaches.   Objective:  Vitals: body mass index is 20.37 kg/m. Today's Vitals   01/21/24 1051  BP: 110/80  Pulse: 88  SpO2: 96%  Weight: 115 lb (52.2 kg)  Height: 5' 3 (1.6 m)  PainSc: 0-No pain   Physical Exam Vitals and nursing note reviewed. Chaperone present: Danielle Porto, CMA.  Constitutional:      General: She is not in acute distress.    Appearance: Normal appearance. She is not ill-appearing or toxic-appearing.  HENT:     Head: Normocephalic and atraumatic.     Right Ear: Hearing, tympanic membrane, ear canal and external ear normal.     Left Ear: Hearing, tympanic membrane, ear canal and external ear normal.     Mouth/Throat:     Pharynx: Oropharynx is clear.  Eyes:     Extraocular  Movements: Extraocular movements intact.     Pupils: Pupils are equal, round, and reactive to light.  Neck:     Thyroid : No thyroid  mass, thyromegaly or thyroid  tenderness.     Vascular: No carotid bruit.  Cardiovascular:     Rate and Rhythm: Normal rate and regular rhythm. No extrasystoles are present.    Pulses:          Dorsalis pedis pulses are 2+ on the right side and 2+ on the left side.     Heart sounds: Normal heart sounds. No murmur heard.    No friction rub. No gallop.  Pulmonary:     Effort: Pulmonary effort is normal.     Breath sounds: Normal breath sounds. No decreased breath sounds, wheezing, rhonchi or rales.  Chest:     Chest wall: No mass.  Abdominal:     Palpations: Abdomen is soft. There is mass (left abdomen, 5 cm in diameter. slight pain with palpation). There is no hepatomegaly or splenomegaly.     Tenderness: There is no abdominal tenderness.     Hernia: No hernia  is present.  Musculoskeletal:     Cervical back: Normal range of motion.     Right lower leg: No edema.     Left lower leg: No edema.  Lymphadenopathy:     Cervical: No cervical adenopathy.     Upper Body:     Right upper body: No supraclavicular adenopathy.     Left upper body: No supraclavicular adenopathy.  Skin:    General: Skin is warm and dry.  Neurological:     General: No focal deficit present.     Mental Status: She is alert and oriented to person, place, and time. Mental status is at baseline.     Sensory: Sensation is intact.     Motor: Motor function is intact. No weakness.     Deep Tendon Reflexes: Reflexes are normal and symmetric.  Psychiatric:        Attention and Perception: Attention normal.        Mood and Affect: Mood normal.        Speech: Speech normal.        Behavior: Behavior normal.        Thought Content: Thought content normal.        Cognition and Memory: Cognition normal.        Judgment: Judgment normal.     Current Outpatient Medications  Medication  Instructions   albuterol  (VENTOLIN  HFA) 108 (90 Base) MCG/ACT inhaler 2 puffs, Inhalation, Every 6 hours PRN   amLODipine  (NORVASC ) 5 mg, Oral, Daily   budesonide -formoterol  (SYMBICORT ) 160-4.5 MCG/ACT inhaler 2 puffs, Inhalation, 2 times daily   calcium  carbonate (OSCAL) 1500 (600 Ca) MG TABS tablet 2 times daily with meals   COMIRNATY syringe    inFLIXimab -axxq (AVSOLA  IV) Inject into the vein. Infuse 5 mg/kg at weeks 0, 2, and 6 then maintenance of 5 mg/kg every 8 weeks thereafter (receives at [facility] infusion center)   ondansetron  (ZOFRAN ) 4 mg, Oral, Every 8 hours PRN   pantoprazole  (PROTONIX ) 40 mg, Oral, Daily   potassium chloride  SA (KLOR-CON  M) 20 MEQ tablet 20 mEq, Oral, Daily   rosuvastatin  (CRESTOR ) 10 mg, Oral, Daily   triamterene -hydrochlorothiazide (MAXZIDE-25) 37.5-25 MG tablet 1 tablet, Oral, Daily   Past Medical History:  Diagnosis Date   Cancer (HCC)    basal cell carcinoma   Cataract    bilateral   Complication of anesthesia    slow to wake up   Dyspnea    on exertion   GERD (gastroesophageal reflux disease)    on meds   Hyperlipidemia    on meds   Hypertension    on meds   Melanoma (HCC) 2018   left upper arm   Moderate persistent asthma without complication 04/04/2018   on meds   Osteoporosis    confirmed by bone scan   PONV (postoperative nausea and vomiting)    Post menopausal syndrome    Pulmonary sarcoidosis (HCC)    tx'd   Medical/Surgical History Narrative:  Allergic/Intolerant to: No Known Allergies  Past Surgical History:  Procedure Laterality Date   BREAST CYST ASPIRATION Right    CATARACT EXTRACTION, BILATERAL Bilateral 10/2020   CESAREAN SECTION     OPEN REDUCTION INTERNAL FIXATION (ORIF) METACARPAL Right 11/24/2014   Procedure: OPEN REDUCTION INTERNAL FIXATION (ORIF) RIGHT THUMB;  Surgeon: Prentice Pagan, MD;  Location: [facility] OR;  Service: Orthopedics;  Laterality: Right;   skin shave biopsy  02/08/2021   left mid lateral posterior  arm   thumb surgery Right 2014  VIDEO BRONCHOSCOPY N/A 11/20/2018   Procedure: Video Bronchoscopy With Fluoro;  Surgeon: Brenna Adine CROME, DO;  Location: [facility] OR;  Service: Thoracic;  Laterality: N/A;   VIDEO BRONCHOSCOPY WITH ENDOBRONCHIAL ULTRASOUND N/A 11/20/2018   Procedure: VIDEO BRONCHOSCOPY WITH ENDOBRONCHIAL ULTRASOUND;  Surgeon: Brenna Adine CROME, DO;  Location: [facility] OR;  Service: Thoracic;  Laterality: N/A;   Family History  Problem Relation Age of Onset   Osteoporosis Mother    Diabetes Father    Heart disease Father    Hyperlipidemia Father    Hypertension Father    Pulmonary fibrosis Father    Asthma Daughter    Breast cancer Neg Hx    Allergic rhinitis Neg Hx    Eczema Neg Hx    Urticaria Neg Hx    Angioedema Neg Hx    Colon polyps Neg Hx    Colon cancer Neg Hx    Esophageal cancer Neg Hx    Rectal cancer Neg Hx    Stomach cancer Neg Hx    Family History Narrative:  Father with history of CABG, diabetes, hyperlipidemia, hypertension died from respiratory failure.  He had pulmonary fibrosis.  Mother with history of Mnire's disease, macular degeneration and remote history of melanoma.  Social History Narrative:  Married with 2 adult children. She does not smoke or consume alcohol. She works for the town of [place].   Most Recent Health Risks Assessment:   Medicare Risk at Home - 01/21/24 1052     Any stairs in or around the home? Yes    If so, are there any without handrails? No    Home free of loose throw rugs in walkways, pet beds, electrical cords, etc? Yes    Adequate lighting in your home to reduce risk of falls? Yes    Life alert? No    Use of a cane, walker or w/c? No    Grab bars in the bathroom? No    Shower chair or bench in shower? No    Elevated toilet seat or a handicapped toilet? No         Most Recent Social Determinants of Health (Including Hx of Tobacco, Alcohol, and Drug Use) SDOH Screenings   Food Insecurity: No Food Insecurity  (01/17/2024)  Housing: Low Risk  (01/17/2024)  Transportation Needs: No Transportation Needs (01/17/2024)  Utilities: Not At Risk (01/17/2024)  Depression (PHQ2-9): Low Risk  (09/26/2023)  Financial Resource Strain: Low Risk  (01/17/2024)  Physical Activity: Insufficiently Active (01/17/2024)  Social Connections: Socially Integrated (01/17/2024)  Stress: No Stress Concern Present (01/17/2024)  Tobacco Use: Low Risk  (01/21/2024)  Health Literacy: Adequate Health Literacy (01/17/2024)   Social History   Tobacco Use   Smoking status: Never   Smokeless tobacco: Never  Vaping Use   Vaping status: Never Used  Substance Use Topics   Alcohol use: No   Drug use: No   Most Recent Functional Status Assessment:    01/21/2024   10:51 AM  In your present state of health, do you have any difficulty performing the following activities:  Hearing? 0  Vision? 0  Difficulty concentrating or making decisions? 0  Walking or climbing stairs? 0  Dressing or bathing? 0  Doing errands, shopping? 0  Preparing Food and eating ? N  Using the Toilet? N  In the past six months, have you accidently leaked urine? N  Do you have problems with loss of bowel control? N  Managing your Medications? N  Managing your Finances? N  Housekeeping  or managing your Housekeeping? N   Most Recent Fall Risk Assessment:    01/21/2024   10:52 AM  Fall Risk   Falls in the past year? 0  Number falls in past yr: 0  Injury with Fall? 0  Risk for fall due to : No Fall Risks  Follow up Falls evaluation completed;Education provided;Falls prevention discussed   Most Recent Anxiety/Depression Screenings:    09/26/2023    8:53 AM 06/06/2023    8:54 AM  PHQ 2/9 Scores  PHQ - 2 Score 0 0    Most Recent Cognitive Screening:    01/21/2024   10:55 AM  6CIT Screen  What Year? 0 points  What month? 0 points  What time? 0 points  Count back from 20 0 points  Months in reverse 0 points  Repeat phrase 0 points  Total Score 0  points   Most Recent Vision/Hearing Screenings:No results found. Results:  Studies Obtained And Personally Reviewed By Me:    10/24/2023 Bone density Left total hip BMD 0.729 T-score -2.2 Z-score -0.9 Osteopenia based on BMD.      03/17/2022 Mammogram no mammographic evidence of malignancy. Repeat in one year   08/17/2021 Colonoscopy Hemorrhoids found on digital rectal exam. Tortuous left colon. Two 2 to 4 mm polyps in the rectum,Resected and retrieved. Polyps were benign hyperplastic polyps. Granularity in the cecum. Biopsied to rule out dysplasia.  Normal mucosa in the entire examined colon otherwise.  Non- bleeding non-thrombosed external and internal hemorrhoids. Repeat in ten years.   Labs:  CBC w/ Differential Lab Results  Component Value Date   WBC 2.7 (L) 01/18/2024   RBC 4.33 01/18/2024   HGB 12.8 01/18/2024   HCT 39.3 01/18/2024   PLT 232 01/18/2024   MCV 90.8 01/18/2024   MCH 29.6 01/18/2024   MCHC 32.6 01/18/2024   RDW 12.1 01/18/2024   MPV 12.0 01/18/2024   LYMPHSABS 1.0 03/22/2023   MONOABS 0.4 12/20/2022   BASOSABS 11 01/18/2024    Comprehensive Metabolic Panel Lab Results  Component Value Date   NA 144 01/18/2024   K 4.2 01/18/2024   CL 105 01/18/2024   CO2 31 01/18/2024   GLUCOSE 86 01/18/2024   BUN 13 01/18/2024   CREATININE 0.92 01/18/2024   CALCIUM  9.7 01/18/2024   PROT 6.6 01/18/2024   ALBUMIN 4.1 10/24/2023   AST 30 01/18/2024   ALT 16 01/18/2024   ALKPHOS 42 10/24/2023   BILITOT 0.5 01/18/2024   GFR 57.60 (L) 10/24/2023   EGFR 68 01/18/2024   GFRNONAA 46 (L) 01/06/2020   Lipid Panel  Lab Results  Component Value Date   CHOL 165 01/18/2024   HDL 84 01/18/2024   LDLCALC 61 01/18/2024   TRIG 112 01/18/2024   A1c No results found for: HGBA1C  TSH Lab Results  Component Value Date   TSH 1.41 01/18/2024    Results for orders placed or performed in visit on 01/21/24  POCT URINALYSIS DIP (CLINITEK)  Result Value Ref Range    Color, UA yellow yellow   Clarity, UA clear clear   Glucose, UA negative negative mg/dL   Bilirubin, UA negative negative   Ketones, POC UA negative negative mg/dL   Spec Grav, UA 8.989 8.989 - 1.025   Blood, UA negative negative   pH, UA 7.0 5.0 - 8.0   POC PROTEIN,UA negative negative, trace   Urobilinogen, UA 0.2 0.2 or 1.0 E.U./dL   Nitrite, UA Negative Negative   Leukocytes, UA Negative Negative  Assessment & Plan:   Orders Placed This Encounter  Procedures   MM 3D SCREENING MAMMOGRAM BILATERAL BREAST    Reason for Exam (SYMPTOM  OR DIAGNOSIS REQUIRED):   healthcare maintenance    Preferred imaging location?:   [facility]   CT ABDOMEN PELVIS W CONTRAST    PALPABLE LEFT MASS ABDOMINAL, OVARIAN MASS? Recently found by patient. No constipation. Slight pain with palpation.    Standing Status:   Future    Expiration Date:   01/20/2025    If indicated for the ordered procedure, I authorize the administration of contrast media per Radiology protocol:   Yes    Does the patient have a contrast media/X-ray dye allergy?:   No    Preferred imaging location?:   [facility]    If indicated for the ordered procedure, I authorize the administration of oral contrast media per Radiology protocol:   Yes   Cologuard   POCT URINALYSIS DIP (CLINITEK)   Meds ordered this encounter  Medications   ondansetron  (ZOFRAN ) 4 MG tablet    Sig: Take 1 tablet (4 mg total) by mouth every 8 (eight) hours as needed for nausea or vomiting.    Dispense:  20 tablet    Refill:  1   Palpable Abdominal mass:  Palpable abdominal mass in the left abdomen about 5 cm in diameter. Slight pain when palpated.   CT Abdomen and Pelvis ordered   Hypertension: treated with Amlodipine  5 mg daily, Maxzide-25 37.5-25 mg tablets daily. Blood pressure today was normal at 110/80.   Hyperlipidemia: treated with Rosuvastatin  10 mg daily. 01/18/2024 lipid panel normal.    GE reflux: treated with Protonix  40 mg  daily.    Asthma: treated with albuterol , Advair  inhalers     Sarcoidosis treated by Dr Acquanetta slater Staff with infliximab -axxq     Mild neutropenia stable, continue to follow   Hypokalemia: treated with potassium chloride  20 mEq daily.    Osteoporosis: was diagnosed by Dr. Alyce Staff in 2021 and was formerly treated with Reclast .  10/24/2023 Bone density Left total hip BMD 0.729 T-score -2.2 Z-score -0.9 Osteopenia based on BMD.      03/17/2022 Mammogram no mammographic evidence of malignancy. Repeat in one year.   Mammogram ordered.   08/17/2021 Colonoscopy Hemorrhoids found on digital rectal exam. Tortuous left colon. Two 2 to 4 mm polyps in the rectum,Resected and retrieved. Polyps were benign hyperplastic polyps. Granularity in the cecum. Biopsied to rule out dysplasia.  Normal mucosa in the entire examined colon otherwise.  Non- bleeding non-thrombosed external and internal hemorrhoids. Repeat in ten years.     Cologuard ordered   Vaccine counseling: Influenza vaccine received 01/14/2024.  Health Maintenance: Eye exam scheduled for February 2026.    No follow-ups on file.   Annual Wellness Visit done today including the all of the following: Reviewed patient's Family Medical History Reviewed patient's SDOH and reviewed tobacco, alcohol, and drug use.  Reviewed and updated list of patient's medical providers Assessment of cognitive impairment was done Assessed patient's functional ability Established a written schedule for health screening services Health Risk Assessent Completed and Reviewed  Discussed health benefits of physical activity, and encouraged her to engage in regular exercise appropriate for her age and condition.    I,Makayla C Reid,acting as a scribe for Ronal JINNY Hailstone, MD.,have documented all relevant documentation on the behalf of Ronal JINNY Hailstone, MD,as directed by  Ronal JINNY Hailstone, MD while in the presence of Ronal JINNY Hailstone,  MD.   LILLETTE Ronal JINNY Perri, MD, have  reviewed all documentation for and agree with the above Annual Wellness Visit documentation.  Ronal JINNY Perri, MD Internal Medicine 01/21/2024

## 2024-01-19 LAB — COMPREHENSIVE METABOLIC PANEL WITH GFR
AG Ratio: 2 (calc) (ref 1.0–2.5)
ALT: 16 U/L (ref 6–29)
AST: 30 U/L (ref 10–35)
Albumin: 4.4 g/dL (ref 3.6–5.1)
Alkaline phosphatase (APISO): 40 U/L (ref 37–153)
BUN: 13 mg/dL (ref 7–25)
CO2: 31 mmol/L (ref 20–32)
Calcium: 9.7 mg/dL (ref 8.6–10.4)
Chloride: 105 mmol/L (ref 98–110)
Creat: 0.92 mg/dL (ref 0.50–1.05)
Globulin: 2.2 g/dL (ref 1.9–3.7)
Glucose, Bld: 86 mg/dL (ref 65–99)
Potassium: 4.2 mmol/L (ref 3.5–5.3)
Sodium: 144 mmol/L (ref 135–146)
Total Bilirubin: 0.5 mg/dL (ref 0.2–1.2)
Total Protein: 6.6 g/dL (ref 6.1–8.1)
eGFR: 68 mL/min/1.73m2 (ref >=60)

## 2024-01-19 LAB — CBC WITH DIFFERENTIAL/PLATELET
Absolute Lymphocytes: 751 {cells}/uL — ABNORMAL LOW (ref 850–3900)
Absolute Monocytes: 397 {cells}/uL (ref 200–950)
Basophils Absolute: 11 {cells}/uL (ref 0–200)
Basophils Relative: 0.4 %
Eosinophils Absolute: 92 {cells}/uL (ref 15–500)
Eosinophils Relative: 3.4 %
HCT: 39.3 % (ref 35.0–45.0)
Hemoglobin: 12.8 g/dL (ref 11.7–15.5)
MCH: 29.6 pg (ref 27.0–33.0)
MCHC: 32.6 g/dL (ref 32.0–36.0)
MCV: 90.8 fL (ref 80.0–100.0)
MPV: 12 fL (ref 7.5–12.5)
Monocytes Relative: 14.7 %
Neutro Abs: 1450 {cells}/uL — ABNORMAL LOW (ref 1500–7800)
Neutrophils Relative %: 53.7 %
Platelets: 232 Thousand/uL (ref 140–400)
RBC: 4.33 Million/uL (ref 3.80–5.10)
RDW: 12.1 % (ref 11.0–15.0)
Total Lymphocyte: 27.8 %
WBC: 2.7 Thousand/uL — ABNORMAL LOW (ref 3.8–10.8)

## 2024-01-19 LAB — LIPID PANEL
Cholesterol: 165 mg/dL (ref <200)
HDL: 84 mg/dL (ref >=50)
LDL Cholesterol (Calc): 61 mg/dL
Non-HDL Cholesterol (Calc): 81 mg/dL (ref <130)
Total CHOL/HDL Ratio: 2 (calc) (ref <5.0)
Triglycerides: 112 mg/dL (ref <150)

## 2024-01-19 LAB — TSH: TSH: 1.41 m[IU]/L (ref 0.40–4.50)

## 2024-01-21 ENCOUNTER — Ambulatory Visit (INDEPENDENT_AMBULATORY_CARE_PROVIDER_SITE_OTHER): Payer: Medicare Other | Admitting: Internal Medicine

## 2024-01-21 ENCOUNTER — Encounter: Payer: Self-pay | Admitting: Internal Medicine

## 2024-01-21 VITALS — BP 110/80 | HR 88 | Ht 63.0 in | Wt 115.0 lb

## 2024-01-21 DIAGNOSIS — E7800 Pure hypercholesterolemia, unspecified: Secondary | ICD-10-CM

## 2024-01-21 DIAGNOSIS — D860 Sarcoidosis of lung: Secondary | ICD-10-CM

## 2024-01-21 DIAGNOSIS — Z0000 Encounter for general adult medical examination without abnormal findings: Secondary | ICD-10-CM | POA: Diagnosis not present

## 2024-01-21 DIAGNOSIS — R1900 Intra-abdominal and pelvic swelling, mass and lump, unspecified site: Secondary | ICD-10-CM

## 2024-01-21 DIAGNOSIS — Z92241 Personal history of systemic steroid therapy: Secondary | ICD-10-CM

## 2024-01-21 DIAGNOSIS — M8580 Other specified disorders of bone density and structure, unspecified site: Secondary | ICD-10-CM

## 2024-01-21 DIAGNOSIS — D049 Carcinoma in situ of skin, unspecified: Secondary | ICD-10-CM

## 2024-01-21 DIAGNOSIS — I10 Essential (primary) hypertension: Secondary | ICD-10-CM | POA: Diagnosis not present

## 2024-01-21 DIAGNOSIS — Z1211 Encounter for screening for malignant neoplasm of colon: Secondary | ICD-10-CM | POA: Diagnosis not present

## 2024-01-21 DIAGNOSIS — Z8719 Personal history of other diseases of the digestive system: Secondary | ICD-10-CM

## 2024-01-21 LAB — POCT URINALYSIS DIP (CLINITEK)
Bilirubin, UA: NEGATIVE
Blood, UA: NEGATIVE
Glucose, UA: NEGATIVE mg/dL
Ketones, POC UA: NEGATIVE mg/dL
Leukocytes, UA: NEGATIVE
Nitrite, UA: NEGATIVE
POC PROTEIN,UA: NEGATIVE
Spec Grav, UA: 1.01 (ref 1.010–1.025)
Urobilinogen, UA: 0.2 U/dL
pH, UA: 7 (ref 5.0–8.0)

## 2024-01-21 MED ORDER — ONDANSETRON HCL 4 MG PO TABS: 4.0000 mg | ORAL_TABLET | Freq: Three times a day (TID) | ORAL | 1 refills | Status: AC | PRN

## 2024-01-21 NOTE — Progress Notes (Addendum)
 Subjective:   Tamara Myers is a 68 y.o. female who presents for Medicare Annual (Subsequent) preventive examination.  Visit Complete: In person  Patient Medicare AWV questionnaire was completed by the patient on 01/21/2024; I have confirmed that all information answered by patient is correct and no changes since this date.  Cardiac Risk Factors include: advanced age (>66men, >41 women);hypertension     Objective:    Today's Vitals   01/21/24 1051  BP: 110/80  Pulse: 88  SpO2: 96%  Weight: 115 lb (52.2 kg)  Height: 5' 3 (1.6 m)  PainSc: 0-No pain   Body mass index is 20.37 kg/m.     01/21/2024   10:55 AM 09/26/2023    8:53 AM 06/06/2023    8:54 AM 04/11/2023    9:31 AM 01/19/2023   11:02 AM 01/17/2022   10:01 AM 11/13/2018    8:19 AM  Advanced Directives  Does Patient Have a Medical Advance Directive? Yes Yes  Yes No No No  Type of Estate agent of [place];Living will Healthcare Power of [place];Living will Healthcare Power of [place];Living will Healthcare Power of [place];Living will     Copy of Healthcare Power of Attorney in Chart? No - copy requested        Would patient like information on creating a medical advance directive?     No - Patient declined No - Patient declined No - Patient declined      Data saved with a previous flowsheet row definition    Current Medications (verified) Outpatient Encounter Medications as of 01/21/2024  Medication Sig   ondansetron  (ZOFRAN ) 4 MG tablet Take 1 tablet (4 mg total) by mouth every 8 (eight) hours as needed for nausea or vomiting.   albuterol  (VENTOLIN  HFA) 108 (90 Base) MCG/ACT inhaler Inhale 2 puffs into the lungs every 6 (six) hours as needed for wheezing or shortness of breath.   amLODipine  (NORVASC ) 5 MG tablet Take 1 tablet (5 mg total) by mouth daily.   budesonide -formoterol  (SYMBICORT ) 160-4.5 MCG/ACT inhaler Inhale 2 puffs into the lungs 2 (two) times daily.   calcium  carbonate (OSCAL)  1500 (600 Ca) MG TABS tablet Take by mouth 2 (two) times daily with a meal.   COMIRNATY syringe    inFLIXimab -axxq (AVSOLA  IV) Inject into the vein. Infuse 5 mg/kg at weeks 0, 2, and 6 then maintenance of 5 mg/kg every 8 weeks thereafter (receives at [facility] infusion center)   pantoprazole  (PROTONIX ) 40 MG tablet TAKE 1 TABLET BY MOUTH DAILY   potassium chloride  SA (KLOR-CON  M) 20 MEQ tablet Take 1 tablet (20 mEq total) by mouth daily.   rosuvastatin  (CRESTOR ) 10 MG tablet Take 1 tablet (10 mg total) by mouth daily.   triamterene -hydrochlorothiazide (MAXZIDE-25) 37.5-25 MG tablet Take 1 tablet by mouth daily.   No facility-administered encounter medications on file as of 01/21/2024.    Allergies (verified) Patient has no known allergies.   History: Past Medical History:  Diagnosis Date   Cancer (HCC)    basal cell carcinoma   Cataract    bilateral   Complication of anesthesia    slow to wake up   Dyspnea    on exertion   GERD (gastroesophageal reflux disease)    on meds   Hyperlipidemia    on meds   Hypertension    on meds   Melanoma (HCC) 2018   left upper arm   Moderate persistent asthma without complication 04/04/2018   on meds  Osteoporosis    confirmed by bone scan   PONV (postoperative nausea and vomiting)    Post menopausal syndrome    Pulmonary sarcoidosis    tx'd   Past Surgical History:  Procedure Laterality Date   BREAST CYST ASPIRATION Right    CATARACT EXTRACTION, BILATERAL Bilateral 10/2020   CESAREAN SECTION     OPEN REDUCTION INTERNAL FIXATION (ORIF) METACARPAL Right 11/24/2014   Procedure: OPEN REDUCTION INTERNAL FIXATION (ORIF) RIGHT THUMB;  Surgeon: Prentice Pagan, MD;  Location: [facility] OR;  Service: Orthopedics;  Laterality: Right;   skin shave biopsy  02/08/2021   left mid lateral posterior arm   thumb surgery Right 2014   VIDEO BRONCHOSCOPY N/A 11/20/2018   Procedure: Video Bronchoscopy With Fluoro;  Surgeon: Brenna Adine CROME, DO;  Location: [facility]  OR;  Service: Thoracic;  Laterality: N/A;   VIDEO BRONCHOSCOPY WITH ENDOBRONCHIAL ULTRASOUND N/A 11/20/2018   Procedure: VIDEO BRONCHOSCOPY WITH ENDOBRONCHIAL ULTRASOUND;  Surgeon: Brenna Adine CROME, DO;  Location: [facility] OR;  Service: Thoracic;  Laterality: N/A;   Family History  Problem Relation Age of Onset   Osteoporosis Mother    Diabetes Father    Heart disease Father    Hyperlipidemia Father    Hypertension Father    Pulmonary fibrosis Father    Asthma Daughter    Breast cancer Neg Hx    Allergic rhinitis Neg Hx    Eczema Neg Hx    Urticaria Neg Hx    Angioedema Neg Hx    Colon polyps Neg Hx    Colon cancer Neg Hx    Esophageal cancer Neg Hx    Rectal cancer Neg Hx    Stomach cancer Neg Hx    Social History   Socioeconomic History   Marital status: Married    Spouse name: Not on file   Number of children: Not on file   Years of education: Not on file   Highest education level: Bachelor's degree (e.g., BA, AB, BS)  Occupational History   Not on file  Tobacco Use   Smoking status: Never   Smokeless tobacco: Never  Vaping Use   Vaping status: Never Used  Substance and Sexual Activity   Alcohol use: No   Drug use: No   Sexual activity: Not on file  Other Topics Concern   Not on file  Social History Narrative   Not on file   Social Drivers of Health   Financial Resource Strain: Low Risk  (01/17/2024)   Overall Financial Resource Strain (CARDIA)    Difficulty of Paying Living Expenses: Not hard at all  Food Insecurity: No Food Insecurity (01/17/2024)   Hunger Vital Sign    Worried About Running Out of Food in the Last Year: Never true    Ran Out of Food in the Last Year: Never true  Transportation Needs: No Transportation Needs (01/17/2024)   PRAPARE - Administrator, Civil Service (Medical): No    Lack of Transportation (Non-Medical): No  Physical Activity: Insufficiently Active (01/17/2024)   Exercise Vital Sign    Days of Exercise per Week: 3 days     Minutes of Exercise per Session: 30 min  Stress: No Stress Concern Present (01/17/2024)   [employer] of Occupational Health - Occupational Stress Questionnaire    Feeling of Stress: Not at all  Social Connections: Socially Integrated (01/17/2024)   Social Connection and Isolation Panel    Frequency of Communication with Friends and Family: More than three times a week  Frequency of Social Gatherings with Friends and Family: Three times a week    Attends Religious Services: More than 4 times per year    Active Member of Clubs or Organizations: Yes    Attends Banker Meetings: More than 4 times per year    Marital Status: Married    Tobacco Counseling Counseling given: Not Answered   Clinical Intake:     Pain Score: 0-No pain                  Activities of Daily Living    01/21/2024   10:51 AM 01/18/2024    9:40 AM  In your present state of health, do you have any difficulty performing the following activities:  Hearing? 0 0  Vision? 0 0  Difficulty concentrating or making decisions? 0 0  Walking or climbing stairs? 0 0  Dressing or bathing? 0 0  Doing errands, shopping? 0 0  Preparing Food and eating ? N N  Using the Toilet? N N  In the past six months, have you accidently leaked urine? N N  Do you have problems with loss of bowel control? N N  Managing your Medications? N N  Managing your Finances? N N  Housekeeping or managing your Housekeeping? N N    Patient Care Team: Perri Ronal PARAS, MD as PCP - General (Internal Medicine) Pa, [facility] Ophthalmology  Indicate any recent Medical Services you may have received from other than [facility] providers in the past year (date may be approximate).     Assessment:   This is a routine wellness examination for Bless.  Hearing/Vision screen No results found.   Goals Addressed   None    Depression Screen    09/26/2023    8:53 AM 06/06/2023    8:54 AM 04/11/2023    9:32 AM 01/19/2023    10:59 AM 01/17/2022   10:02 AM 01/08/2020    3:03 PM 01/02/2019    3:04 PM  PHQ 2/9 Scores  PHQ - 2 Score 0 0 0 0 0 0 0    Fall Risk    01/21/2024   10:52 AM 01/18/2024    9:40 AM 01/17/2024   11:01 AM 09/26/2023    8:53 AM 06/06/2023    8:54 AM  Fall Risk   Falls in the past year? 0 0 0 0 0  Number falls in past yr: 0 0 0  0  Injury with Fall? 0 0 0    Risk for fall due to : No Fall Risks   No Fall Risks No Fall Risks  Follow up Falls evaluation completed;Education provided;Falls prevention discussed    Falls evaluation completed    MEDICARE RISK AT HOME: Medicare Risk at Home Any stairs in or around the home?: Yes If so, are there any without handrails?: No Home free of loose throw rugs in walkways, pet beds, electrical cords, etc?: Yes Adequate lighting in your home to reduce risk of falls?: Yes Life alert?: No Use of a cane, walker or w/c?: No Grab bars in the bathroom?: No Shower chair or bench in shower?: No Elevated toilet seat or a handicapped toilet?: No  TIMED UP AND GO:  Was the test performed?  No    Cognitive Function:        01/21/2024   10:55 AM 01/19/2023   11:00 AM 01/17/2022   10:03 AM  6CIT Screen  What Year? 0 points 0 points 0 points  What month? 0 points 0  points 0 points  What time? 0 points 0 points 0 points  Count back from 20 0 points 0 points 0 points  Months in reverse 0 points 0 points 0 points  Repeat phrase 0 points 0 points 0 points  Total Score 0 points 0 points 0 points    Immunizations Immunization History  Administered Date(s) Administered   Fluad Quad(high Dose 65+) 12/30/2021   Influenza Inj Mdck Quad Pf 02/13/2014, 01/21/2021   Influenza Inj Mdck Quad With Preservative 02/13/2014   Influenza Split 02/13/2013   Influenza, Seasonal, Injecte, Preservative Fre 02/13/2013   Influenza,inj,Quad PF,6+ Mos 12/21/2016, 12/25/2017, 01/02/2019, 01/08/2020, 01/17/2022   Influenza,inj,quad, With Preservative 01/14/2024    Influenza,trivalent, recombinat, inj, PF 02/09/2011, 02/15/2012   Influenza-Unspecified 02/13/2013, 01/22/2023   PFIZER(Purple Top)SARS-COV-2 Vaccination 07/03/2019, 08/03/2019, 02/26/2020, 01/21/2021, 12/29/2022   PNEUMOCOCCAL CONJUGATE-20 01/10/2021   Respiratory Syncytial Virus Vaccine,Recomb Aduvanted(Arexvy) 01/26/2022   Tdap 03/16/2009, 01/15/2020   Zoster Recombinant(Shingrix) 11/22/2021, 02/08/2022    TDAP status: Up to date  Flu Vaccine status: Up to date  Pneumococcal vaccine status: Up to date  Covid-19 vaccine status: Information provided on how to obtain vaccines.   Qualifies for Shingles Vaccine? Yes   Zostavax completed Yes   Shingrix Completed?: Yes  Screening Tests Health Maintenance  Topic Date Due   Mammogram  03/17/2024   COVID-19 Vaccine (7 - 2025-26 season) 02/06/2024 (Originally 12/31/2023)   OPHTHALMOLOGY EXAM  06/25/2024 (Originally 12/07/2022)   Fecal DNA (Cologuard)  01/20/2025 (Originally 11/07/2017)   Medicare Annual Wellness (AWV)  01/20/2025   DTaP/Tdap/Td (3 - Td or Tdap) 01/14/2030   Colonoscopy  08/18/2031   Pneumococcal Vaccine: 50+ Years  Completed   Influenza Vaccine  Completed   DEXA SCAN  Completed   Hepatitis C Screening  Completed   Zoster Vaccines- Shingrix  Completed   HPV VACCINES  Aged Out   Meningococcal B Vaccine  Aged Out    Health Maintenance  Health Maintenance Due  Topic Date Due   Mammogram  03/17/2024    Colorectal cancer screening: Type of screening: Colonoscopy. Completed 10/25/2021. Repeat every 10 years  Mammogram status: Ordered 01/21/2024. Pt provided with contact info and advised to call to schedule appt.   Bone Density status: Completed 10/24/2023. Results reflect: Bone density results: OSTEOPENIA. Repeat every 2 years.  Lung Cancer Screening: (Low Dose CT Chest recommended if Age 62-80 years, 20 pack-year currently smoking OR have quit w/in 15years.) does not qualify.   Additional Screening:  Hepatitis C  Screening: does not qualify; Completed   Vision Screening: Recommended annual ophthalmology exams for early detection of glaucoma and other disorders of the eye. Is the patient up to date2 with their annual eye exam?  Yes  Who is the provider or what is the name of the office in which the patient attends annual eye exams? [facility]  If pt is not established with a provider, would they like to be referred to a provider to establish care? No .   Dental Screening: Recommended annual dental exams for proper oral hygiene  Community Resource Referral / Chronic Care Management: CRR required this visit?  No   CCM required this visit?  No     Plan:     I have personally reviewed and noted the following in the patient's chart:   Medical and social history Use of alcohol, tobacco or illicit drugs  Current medications and supplements including opioid prescriptions. Patient is not currently taking opioid prescriptions. Functional ability and status Nutritional status  Physical activity Advanced directives List of other physicians Hospitalizations, surgeries, and ER visits in previous 12 months Vitals Screenings to include cognitive, depression, and falls Referrals and appointments  In addition, I have reviewed and discussed with patient certain preventive protocols, quality metrics, and best practice recommendations. A written personalized care plan for preventive services as well as general preventive health recommendations were provided to patient.     Araceli Zelda, CMA   01/21/2024   After Visit Summary: (In Person-Printed) AVS printed and given to the patient  I, Ronal JINNY Hailstone, MD, have reviewed all documentation for this visit. The documentation on 01/21/2024 for the exam, diagnosis, procedures, and orders are all accurate and complete.   I have reviewed and agree with the above Annual Wellness Visit documentation.  Ronal Norleen Hailstone, MD. Internal Medicine 01/21/2024

## 2024-01-21 NOTE — Patient Instructions (Addendum)
 Patient has palpable 5 cm mass left mid-abdomen that she recently noticed. Is slightly tender. Will order CT of abdomen with contrast. Labs reviewed today.  Next appointment: Follow up in one year for your annual wellness visit    Preventive Care 65 Years and Older, Female Preventive care refers to lifestyle choices and visits with your health care provider that can promote health and wellness. What does preventive care include? A yearly physical exam. This is also called an annual well check. Dental exams once or twice a year. Routine eye exams. Ask your health care provider how often you should have your eyes checked. Personal lifestyle choices, including: Daily care of your teeth and gums. Regular physical activity. Eating a healthy diet. Avoiding tobacco and drug use. Limiting alcohol use. Practicing safe sex. Taking low-dose aspirin every day. Taking vitamin and mineral supplements as recommended by your health care provider. What happens during an annual well check? The services and screenings done by your health care provider during your annual well check will depend on your age, overall health, lifestyle risk factors, and family history of disease. Counseling  Your health care provider may ask you questions about your: Alcohol use. Tobacco use. Drug use. Emotional well-being. Home and relationship well-being. Sexual activity. Eating habits. History of falls. Memory and ability to understand (cognition). Work and work Astronomer. Reproductive health. Screening  You may have the following tests or measurements: Height, weight, and BMI. Blood pressure. Lipid and cholesterol levels. These may be checked every 5 years, or more frequently if you are over 25 years old. Skin check. Lung cancer screening. You may have this screening every year starting at age 20 if you have a 30-pack-year history of smoking and currently smoke or have quit within the past 15 years. Fecal  occult blood test (FOBT) of the stool. You may have this test every year starting at age 32. Flexible sigmoidoscopy or colonoscopy. You may have a sigmoidoscopy every 5 years or a colonoscopy every 10 years starting at age 54. Hepatitis C blood test. Hepatitis B blood test. Sexually transmitted disease (STD) testing. Diabetes screening. This is done by checking your blood sugar (glucose) after you have not eaten for a while (fasting). You may have this done every 1-3 years. Bone density scan. This is done to screen for osteoporosis. You may have this done starting at age 37. Mammogram. This may be done every 1-2 years. Talk to your health care provider about how often you should have regular mammograms. Talk with your health care provider about your test results, treatment options, and if necessary, the need for more tests. Vaccines  Your health care provider may recommend certain vaccines, such as: Influenza vaccine. This is recommended every year. Tetanus, diphtheria, and acellular pertussis (Tdap, Td) vaccine. You may need a Td booster every 10 years. Zoster vaccine. You may need this after age 61. Pneumococcal 13-valent conjugate (PCV13) vaccine. One dose is recommended after age 4. Pneumococcal polysaccharide (PPSV23) vaccine. One dose is recommended after age 95. Talk to your health care provider about which screenings and vaccines you need and how often you need them. This information is not intended to replace advice given to you by your health care provider. Make sure you discuss any questions you have with your health care provider. Document Released: 05/14/2015 Document Revised: 01/05/2016 Document Reviewed: 02/16/2015 Elsevier Interactive Patient Education  2017 [employer].  Fall Prevention in the Home Falls can cause injuries. They can happen to people of all ages.  There are many things you can do to make your home safe and to help prevent falls. What can I do on the outside  of my home? Regularly fix the edges of walkways and driveways and fix any cracks. Remove anything that might make you trip as you walk through a door, such as a raised step or threshold. Trim any bushes or trees on the path to your home. Use bright outdoor lighting. Clear any walking paths of anything that might make someone trip, such as rocks or tools. Regularly check to see if handrails are loose or broken. Make sure that both sides of any steps have handrails. Any raised decks and porches should have guardrails on the edges. Have any leaves, snow, or ice cleared regularly. Use sand or salt on walking paths during winter. Clean up any spills in your garage right away. This includes oil or grease spills. What can I do in the bathroom? Use night lights. Install grab bars by the toilet and in the tub and shower. Do not use towel bars as grab bars. Use non-skid mats or decals in the tub or shower. If you need to sit down in the shower, use a plastic, non-slip stool. Keep the floor dry. Clean up any water  that spills on the floor as soon as it happens. Remove soap buildup in the tub or shower regularly. Attach bath mats securely with double-sided non-slip rug tape. Do not have throw rugs and other things on the floor that can make you trip. What can I do in the bedroom? Use night lights. Make sure that you have a light by your bed that is easy to reach. Do not use any sheets or blankets that are too big for your bed. They should not hang down onto the floor. Have a firm chair that has side arms. You can use this for support while you get dressed. Do not have throw rugs and other things on the floor that can make you trip. What can I do in the kitchen? Clean up any spills right away. Avoid walking on wet floors. Keep items that you use a lot in easy-to-reach places. If you need to reach something above you, use a strong step stool that has a grab bar. Keep electrical cords out of the  way. Do not use floor polish or wax that makes floors slippery. If you must use wax, use non-skid floor wax. Do not have throw rugs and other things on the floor that can make you trip. What can I do with my stairs? Do not leave any items on the stairs. Make sure that there are handrails on both sides of the stairs and use them. Fix handrails that are broken or loose. Make sure that handrails are as long as the stairways. Check any carpeting to make sure that it is firmly attached to the stairs. Fix any carpet that is loose or worn. Avoid having throw rugs at the top or bottom of the stairs. If you do have throw rugs, attach them to the floor with carpet tape. Make sure that you have a light switch at the top of the stairs and the bottom of the stairs. If you do not have them, ask someone to add them for you. What else can I do to help prevent falls? Wear shoes that: Do not have high heels. Have rubber bottoms. Are comfortable and fit you well. Are closed at the toe. Do not wear sandals. If you use a stepladder: Make  sure that it is fully opened. Do not climb a closed stepladder. Make sure that both sides of the stepladder are locked into place. Ask someone to hold it for you, if possible. Clearly mark and make sure that you can see: Any grab bars or handrails. First and last steps. Where the edge of each step is. Use tools that help you move around (mobility aids) if they are needed. These include: Canes. Walkers. Scooters. Crutches. Turn on the lights when you go into a dark area. Replace any light bulbs as soon as they burn out. Set up your furniture so you have a clear path. Avoid moving your furniture around. If any of your floors are uneven, fix them. If there are any pets around you, be aware of where they are. Review your medicines with your doctor. Some medicines can make you feel dizzy. This can increase your chance of falling. Ask your doctor what other things that you can  do to help prevent falls. This information is not intended to replace advice given to you by your health care provider. Make sure you discuss any questions you have with your health care provider. Document Released: 02/11/2009 Document Revised: 09/23/2015 Document Reviewed: 05/22/2014 Elsevier Interactive Patient Education  2017 [employer].

## 2024-01-23 ENCOUNTER — Encounter: Payer: Self-pay | Admitting: Internal Medicine

## 2024-01-23 ENCOUNTER — Ambulatory Visit

## 2024-01-23 VITALS — BP 117/76 | HR 63 | Temp 98.0°F | Resp 16 | Ht 63.0 in | Wt 116.6 lb

## 2024-01-23 DIAGNOSIS — D869 Sarcoidosis, unspecified: Secondary | ICD-10-CM | POA: Diagnosis not present

## 2024-01-23 DIAGNOSIS — Z79899 Other long term (current) drug therapy: Secondary | ICD-10-CM | POA: Diagnosis not present

## 2024-01-23 MED ORDER — DIPHENHYDRAMINE HCL 25 MG PO CAPS
25.0000 mg | ORAL_CAPSULE | Freq: Once | ORAL | Status: AC
  Administered 2024-01-23: 25 mg via ORAL
  Filled 2024-01-23: qty 1

## 2024-01-23 MED ORDER — ACETAMINOPHEN 325 MG PO TABS
650.0000 mg | ORAL_TABLET | Freq: Once | ORAL | Status: AC
  Administered 2024-01-23: 650 mg via ORAL
  Filled 2024-01-23: qty 2

## 2024-01-23 MED ORDER — SODIUM CHLORIDE 0.9 % IV SOLN
5.0000 mg/kg | Freq: Once | INTRAVENOUS | Status: AC
  Administered 2024-01-23: 300 mg via INTRAVENOUS
  Filled 2024-01-23: qty 30

## 2024-01-23 NOTE — Progress Notes (Signed)
 Diagnosis: Sarcoidosis  Provider:  Lonna Coder MD  Procedure: IV Infusion  IV Type: Peripheral, IV Location: R Forearm  Avsola  (infliximab -axxq), Dose: 300 mg  Infusion Start Time: 0925  Infusion Stop Time: 1137  Post Infusion IV Care: Peripheral IV Discontinued  Discharge: Condition: Good, Destination: Home . AVS Declined  Performed by:  Eleanor DELENA Bloch, RN

## 2024-01-24 ENCOUNTER — Ambulatory Visit
Admission: RE | Admit: 2024-01-24 | Discharge: 2024-01-24 | Disposition: A | Discharge status: Home / Self Care | Source: Ambulatory Visit | Attending: Internal Medicine | Admitting: Internal Medicine

## 2024-01-24 ENCOUNTER — Encounter: Payer: Self-pay | Admitting: *Deleted

## 2024-01-24 ENCOUNTER — Ambulatory Visit: Payer: Self-pay | Admitting: Internal Medicine

## 2024-01-24 ENCOUNTER — Other Ambulatory Visit: Payer: Self-pay | Admitting: *Deleted

## 2024-01-24 ENCOUNTER — Encounter: Payer: Self-pay | Admitting: Radiology

## 2024-01-24 DIAGNOSIS — R1900 Intra-abdominal and pelvic swelling, mass and lump, unspecified site: Secondary | ICD-10-CM

## 2024-01-24 MED ORDER — IOPAMIDOL (ISOVUE-300) INJECTION 61%
100.0000 mL | Freq: Once | INTRAVENOUS | Status: AC | PRN
  Administered 2024-01-24: 100 mL via INTRAVENOUS

## 2024-01-24 NOTE — Progress Notes (Signed)
 PATIENT NAVIGATOR PROGRESS NOTE  Name: Tamara Myers Date: 01/24/2024 MRN: [id number]  DOB: 1955/11/04   Reason for visit:  Introductory phone call  Comments:  Called and spoke with pt and scheduled her with Olam Ned NP  on 01/29/24 at 11:15 am with Olam Ned NP. Discussed what to expect with appt, directions to building and parking as well as one support person in the appt.  Pt stated that she did not have a Urologist so referral entered for appt with [facility], verbalized understanding and appreciation    Time spent counseling/coordinating care: > 60 minutes

## 2024-01-24 NOTE — Progress Notes (Unsigned)
 Referral entered

## 2024-01-28 ENCOUNTER — Encounter: Payer: Self-pay | Admitting: *Deleted

## 2024-01-28 ENCOUNTER — Other Ambulatory Visit: Payer: Self-pay | Admitting: *Deleted

## 2024-01-28 ENCOUNTER — Inpatient Hospital Stay: Attending: Nurse Practitioner | Admitting: Nurse Practitioner

## 2024-01-28 ENCOUNTER — Encounter: Payer: Self-pay | Admitting: Nurse Practitioner

## 2024-01-28 VITALS — BP 133/87 | HR 75 | Temp 97.8°F | Resp 18 | Ht 63.0 in | Wt 115.6 lb

## 2024-01-28 DIAGNOSIS — R1900 Intra-abdominal and pelvic swelling, mass and lump, unspecified site: Secondary | ICD-10-CM

## 2024-01-28 NOTE — Progress Notes (Addendum)
 New Hematology/Oncology Consult   Requesting MD: Dr. Ronal Hailstone  [phone]      Reason for Consult: Palpable abdominal mass  HPI: Tamara Myers is a 68 year old woman recently found to have a palpable abdominal mass.  At the end of July she noted a golf ball sized mass at the left lower abdomen.  The mass has increased in size.  No pain, more so discomfort with a pulling sensation when she stands.  She saw Dr. Hailstone for an annual exam on 01/21/2024.  An approximate 5 cm mass was noted at the left abdomen.  CTs 01/24/2024 showed a large bulky expansile mass occupying the inferior pole calyces and pelvis of the left kidney measuring 12.4 x 11.2 x 8.2 cm with bulky expansile thrombus throughout the left renal vein extending to the confluence of the inferior vena cava; mildly enlarged left retroperitoneal lymph nodes.     Past Medical History:  Diagnosis Date   Cancer (HCC)    basal cell carcinoma   Cataract    bilateral   Complication of anesthesia    slow to wake up   Dyspnea    on exertion   GERD (gastroesophageal reflux disease)    on meds   Hyperlipidemia    on meds   Hypertension    on meds   Melanoma (HCC) 2018   left upper arm   Moderate persistent asthma without complication 04/04/2018   on meds   Osteoporosis    confirmed by bone scan   PONV (postoperative nausea and vomiting)    Post menopausal syndrome    Pulmonary sarcoidosis    tx'd     Past Surgical History:  Procedure Laterality Date   BREAST CYST ASPIRATION Right    CATARACT EXTRACTION, BILATERAL Bilateral 10/2020   CESAREAN SECTION     OPEN REDUCTION INTERNAL FIXATION (ORIF) METACARPAL Right 11/24/2014   Procedure: OPEN REDUCTION INTERNAL FIXATION (ORIF) RIGHT THUMB;  Surgeon: Prentice Pagan, MD;  Location: [facility];  Service: Orthopedics;  Laterality: Right;   skin shave biopsy  02/08/2021   left mid lateral posterior arm   thumb surgery Right 2014   VIDEO BRONCHOSCOPY N/A 11/20/2018   Procedure:  Video Bronchoscopy With Fluoro;  Surgeon: Brenna Adine CROME, DO;  Location: [facility];  Service: Thoracic;  Laterality: N/A;   VIDEO BRONCHOSCOPY WITH ENDOBRONCHIAL ULTRASOUND N/A 11/20/2018   Procedure: VIDEO BRONCHOSCOPY WITH ENDOBRONCHIAL ULTRASOUND;  Surgeon: Brenna Adine CROME, DO;  Location: [facility];  Service: Thoracic;  Laterality: N/A;     Current Outpatient Medications:    albuterol  (VENTOLIN  HFA) 108 (90 Base) MCG/ACT inhaler, Inhale 2 puffs into the lungs every 6 (six) hours as needed for wheezing or shortness of breath., Disp: 18 g, Rfl: 1   amLODipine  (NORVASC ) 5 MG tablet, Take 1 tablet (5 mg total) by mouth daily., Disp: 90 tablet, Rfl: 1   budesonide -formoterol  (SYMBICORT ) 160-4.5 MCG/ACT inhaler, Inhale 2 puffs into the lungs 2 (two) times daily., Disp: 1 each, Rfl: 5   calcium  carbonate (OSCAL) 1500 (600 Ca) MG TABS tablet, Take by mouth 2 (two) times daily with a meal., Disp: , Rfl:    COMIRNATY syringe, , Disp: , Rfl:    inFLIXimab -axxq (AVSOLA  IV), Inject into the vein. Infuse 5 mg/kg at weeks 0, 2, and 6 then maintenance of 5 mg/kg every 8 weeks thereafter (receives at [facility] infusion center), Disp: , Rfl:    ondansetron  (ZOFRAN ) 4 MG tablet, Take 1 tablet (4 mg total) by mouth every  8 (eight) hours as needed for nausea or vomiting., Disp: 20 tablet, Rfl: 1   pantoprazole  (PROTONIX ) 40 MG tablet, TAKE 1 TABLET BY MOUTH DAILY, Disp: 90 tablet, Rfl: 3   potassium chloride  SA (KLOR-CON  M) 20 MEQ tablet, Take 1 tablet (20 mEq total) by mouth daily., Disp: 90 tablet, Rfl: 3   rosuvastatin  (CRESTOR ) 10 MG tablet, Take 1 tablet (10 mg total) by mouth daily., Disp: 90 tablet, Rfl: 3   triamterene -hydrochlorothiazide (MAXZIDE-25) 37.5-25 MG tablet, Take 1 tablet by mouth daily., Disp: 90 tablet, Rfl: 1:    No Known Allergies:  FH: Mother with history of melanoma.  Father idiopathic pulmonary fibrosis.  Brother hypertension.  Sister kidney issues.  SOCIAL HISTORY: She lives in  [place].  She is married.  She is retired.  She previously worked in Dietitian.  No tobacco or alcohol use.  She has never had a blood transfusion.  Review of Systems: No fevers.  She estimates night sweats 1 time per week.  She attributes this to menopause.  Appetite described as good.  She estimates losing 10 pounds of weight unintentionally since January of this year.  No dysphagia.  For the past few months she has been experiencing nausea/vomiting approximately 1 time per week.  No change in baseline shortness of breath and cough which she attributes to sarcoidosis.  No hematuria or dysuria.   Physical Exam:  Blood pressure 133/87, pulse 75, temperature 97.8 F (36.6 C), temperature source Temporal, resp. rate 18, height 5' 3 (1.6 m), weight 115 lb 9.6 oz (52.4 kg), SpO2 100%.  HEENT: No thrush or ulcers. Lungs: Lungs clear bilaterally. Cardiac: Regular rate and rhythm. Abdomen: No hepatomegaly.  Large palpable mass occupying the left abdomen.  No significant associated tenderness. Vascular: No leg edema. Lymph nodes: No palpable cervical, supraclavicular or axillary lymph nodes.  Shotty bilateral inguinal nodes. Neurologic: Alert and oriented. Skin: 2 scars at the left upper arm without evidence of tumor recurrence.  LABS:  No results for input(s): WBC, HGB, HCT, PLT in the last 72 hours.  No results for input(s): NA, K, CL, CO2, GLUCOSE, BUN, CREATININE, CALCIUM  in the last 72 hours.    RADIOLOGY:  CT ABDOMEN PELVIS W CONTRAST Result Date: 01/24/2024 CLINICAL DATA:  Abdominal mass * Tracking Code: BO * EXAM: CT ABDOMEN AND PELVIS WITH CONTRAST TECHNIQUE: Multidetector CT imaging of the abdomen and pelvis was performed using the standard protocol following bolus administration of intravenous contrast. RADIATION DOSE REDUCTION: This exam was performed according to the departmental dose-optimization program which includes automated exposure  control, adjustment of the mA and/or kV according to patient size and/or use of iterative reconstruction technique. CONTRAST:  100mL ISOVUE -300 IOPAMIDOL  (ISOVUE -300) INJECTION 61% COMPARISON:  None Available. FINDINGS: Lower chest: No acute abnormality. Hepatobiliary: No solid liver abnormality is seen. No gallstones, gallbladder wall thickening, or biliary dilatation. Pancreas: Unremarkable. No pancreatic ductal dilatation or surrounding inflammatory changes. Spleen: Normal in size without significant abnormality. Adrenals/Urinary Tract: Adrenal glands are unremarkable. Extremely large, bulky expansile mass occupying the inferior pole calices and pelvis of the left kidney measuring 12.4 x 11.2 x 8.2 cm (series 5, image 59 series 2, image 67), with bulky, expansile thrombus throughout the left renal vein extending to the confluence of the inferior vena cava (series 2, image 26). Right kidney is normal, without renal calculi, solid lesion, or hydronephrosis. Bladder is unremarkable. Stomach/Bowel: Stomach is within normal limits. Appendix appears normal. No evidence of bowel wall thickening, distention, or inflammatory changes.  Sigmoid diverticulosis. Vascular/Lymphatic: Aortic atherosclerosis. Mildly enlarged left retroperitoneal lymph nodes measuring up 2 1.3 x 0.9 cm (series 2, image 66). Reproductive: No mass. Prominent bilateral uterine and adnexal varices (series 2, image 69). Other: No abdominal wall hernia or abnormality. No ascites. Musculoskeletal: No acute or significant osseous findings. IMPRESSION: 1. Extremely large, bulky expansile mass occupying the inferior pole calices and pelvis of the left kidney measuring 12.4 x 11.2 x 8.2 cm, consistent with renal cell carcinoma. 2. 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 3. Mildly enlarged left retroperitoneal lymph nodes, highly suspicious for nodal  metastatic disease. These results will be called to the ordering clinician or representative by the Radiologist Assistant, and communication documented in the PACS or [employer]. Aortic Atherosclerosis (ICD10-I70.0). Electronically Signed   By: Marolyn JONETTA Jaksch M.D.   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 Kassie, maintained on infliximab  Hypertension Hypercholesterolemia  Ms. Brumm was referred for evaluation of a large mass involving the inferior pole calyces and pelvis of the left kidney.  We reviewed the CT report and images with Ms. Wilhoite and her husband.  The differential includes urothelial malignancy, renal cell carcinoma, lymphoma or other malignancy.  We have referred her for a staging PET scan.  She is scheduled to see Dr. Roseann, urology later this week.  She will return for a follow-up appointment within a few days of the PET scan.  We are available to see her sooner if needed.  Patient seen with Dr. Cloretta.  Olam Ned, NP 01/28/2024, 2:33 PM  This was a shared visit with Olam Ned.  Ms. Smet was interviewed and examined.  We reviewed CT images with Ms. Jeoffrey and her husband.  She is referred for evaluation of a left renal mass.  We discussed the differential diagnosis including urothelial carcinoma, renal cell cancer, lymphoma, and other malignancy such as sarcoma.  She has been referred to Dr. Roseann.  Ms. Ciaravino will undergo a staging PET scan prior to a follow-up visit here.  I will coordinate a biopsy and treatment plan with Dr. Roseann.  It is unlikely the current malignancy is related to sarcoidosis or infliximab , but this is possible.  I was present for greater than 50% of today's visit.  I performed medical decision  making.  Arvella Cloretta, MD

## 2024-01-28 NOTE — Progress Notes (Signed)
 PATIENT NAVIGATOR PROGRESS NOTE  Name: Tamara Myers Date: 01/28/2024 MRN: [id number]  DOB: 01-02-1956   Reason for visit:  New Patient appt  Comments:  Met with Mr and Mrs Droz after appointment with Olam Ned NP and Dr Cloretta  PET scan scheduled for 10/3 Urology appt with Dr Roseann on 10/2 Given Journeys notebook with contact information included    Time spent counseling/coordinating care: > 60 minutes

## 2024-01-28 NOTE — Progress Notes (Signed)
 Referral to Dr Roseann urgent

## 2024-01-29 ENCOUNTER — Inpatient Hospital Stay: Admitting: Nurse Practitioner

## 2024-01-29 DIAGNOSIS — Z1211 Encounter for screening for malignant neoplasm of colon: Secondary | ICD-10-CM | POA: Diagnosis not present

## 2024-01-30 NOTE — Progress Notes (Unsigned)
 Assessment: 1. Left renal mass     Plan: I personally reviewed the patient's chart including provider notes, labs and imaging results. The large left renal mass is very suspicious for a renal cell carcinoma.  There is significant thrombus involving the entire left renal vein and extending to the IVC.  It is unclear at this point if there is any metastatic disease.  The retroperitoneal lymph nodes may be reactive in nature. Will await results of PET scan. Recommend referral to tertiary center for possible surgical management of the large renal mass with renal vein thrombus.  Chief Complaint: No chief complaint on file.   History of Present Illness:  Tamara Myers is a 68 y.o. female who is seen in consultation from Arley Hof, MD for evaluation of left renal mass.  She recently underwent CT imaging for evaluation of a palpable abdominal mass. CT abdomen and pelvis from 01/24/2024 showed a large bulky expansile mass occupying the entire lower pole of the left kidney measuring 12.4 x 11.2 x 8.2 cm with a bulky expansile thrombus throughout the left renal vein extending to the confluence of the inferior vena cava; normal right kidney; mildly enlarged left retroperitoneal lymph nodes measuring 1.3 x 0.9 cm. PET scan pending. Creatinine 0.92.  Normal LFTs.  Past Medical History:  Past Medical History:  Diagnosis Date   Cancer (HCC)    basal cell carcinoma   Cataract    bilateral   Complication of anesthesia    slow to wake up   Dyspnea    on exertion   GERD (gastroesophageal reflux disease)    on meds   Hyperlipidemia    on meds   Hypertension    on meds   Melanoma (HCC) 2018   left upper arm   Moderate persistent asthma without complication 04/04/2018   on meds   Osteoporosis    confirmed by bone scan   PONV (postoperative nausea and vomiting)    Post menopausal syndrome    Pulmonary sarcoidosis    tx'd    Past Surgical History:  Past Surgical History:  Procedure  Laterality Date   BREAST CYST ASPIRATION Right    CATARACT EXTRACTION, BILATERAL Bilateral 10/2020   CESAREAN SECTION     OPEN REDUCTION INTERNAL FIXATION (ORIF) METACARPAL Right 11/24/2014   Procedure: OPEN REDUCTION INTERNAL FIXATION (ORIF) RIGHT THUMB;  Surgeon: Prentice Pagan, MD;  Location: [facility];  Service: Orthopedics;  Laterality: Right;   skin shave biopsy  02/08/2021   left mid lateral posterior arm   thumb surgery Right 2014   VIDEO BRONCHOSCOPY N/A 11/20/2018   Procedure: Video Bronchoscopy With Fluoro;  Surgeon: Brenna Adine CROME, DO;  Location: [facility];  Service: Thoracic;  Laterality: N/A;   VIDEO BRONCHOSCOPY WITH ENDOBRONCHIAL ULTRASOUND N/A 11/20/2018   Procedure: VIDEO BRONCHOSCOPY WITH ENDOBRONCHIAL ULTRASOUND;  Surgeon: Brenna Adine CROME, DO;  Location: [facility];  Service: Thoracic;  Laterality: N/A;    Allergies:  No Known Allergies  Family History:  Family History  Problem Relation Age of Onset   Osteoporosis Mother    Diabetes Father    Heart disease Father    Hyperlipidemia Father    Hypertension Father    Pulmonary fibrosis Father    Asthma Daughter    Breast cancer Neg Hx    Allergic rhinitis Neg Hx    Eczema Neg Hx    Urticaria Neg Hx    Angioedema Neg Hx    Colon polyps Neg Hx    Colon  cancer Neg Hx    Esophageal cancer Neg Hx    Rectal cancer Neg Hx    Stomach cancer Neg Hx     Social History:  Social History   Tobacco Use   Smoking status: Never   Smokeless tobacco: Never  Vaping Use   Vaping status: Never Used  Substance Use Topics   Alcohol use: No   Drug use: No    Review of symptoms:  Constitutional:  Negative for unexplained weight loss, night sweats, fever, chills ENT:  Negative for nose bleeds, sinus pain, painful swallowing CV:  Negative for chest pain, shortness of breath, exercise intolerance, palpitations, loss of consciousness Resp:  Negative for cough, wheezing, shortness of breath GI:  Negative for nausea, vomiting,  diarrhea, bloody stools GU:  Positives noted in HPI; otherwise negative for gross hematuria, dysuria, urinary incontinence Neuro:  Negative for seizures, poor balance, limb weakness, slurred speech Psych:  Negative for lack of energy, depression, anxiety Endocrine:  Negative for polydipsia, polyuria, symptoms of hypoglycemia (dizziness, hunger, sweating) Hematologic:  Negative for anemia, purpura, petechia, prolonged or excessive bleeding, use of anticoagulants  Allergic:  Negative for difficulty breathing or choking as a result of exposure to anything; no shellfish allergy; no allergic response (rash/itch) to materials, foods  Physical exam: There were no vitals taken for this visit. GENERAL APPEARANCE:  Well appearing, well developed, well nourished, NAD HEENT: Atraumatic, Normocephalic, oropharynx clear. NECK: Supple without lymphadenopathy or thyromegaly. LUNGS: Clear to auscultation bilaterally. HEART: Regular Rate and Rhythm without murmurs, gallops, or rubs. ABDOMEN: Soft, non-tender, No Masses. EXTREMITIES: Moves all extremities well.  Without clubbing, cyanosis, or edema. NEUROLOGIC:  Alert and oriented x 3, normal gait, CN II-XII grossly intact.  MENTAL STATUS:  Appropriate. BACK:  Non-tender to palpation.  No CVAT SKIN:  Warm, dry and intact.    Results: U/A:

## 2024-01-31 ENCOUNTER — Ambulatory Visit (INDEPENDENT_AMBULATORY_CARE_PROVIDER_SITE_OTHER): Admitting: Urology

## 2024-01-31 ENCOUNTER — Encounter: Payer: Self-pay | Admitting: Urology

## 2024-01-31 ENCOUNTER — Other Ambulatory Visit (HOSPITAL_BASED_OUTPATIENT_CLINIC_OR_DEPARTMENT_OTHER): Payer: Self-pay | Admitting: Pulmonary Disease

## 2024-01-31 ENCOUNTER — Encounter: Payer: Self-pay | Admitting: Pulmonary Disease

## 2024-01-31 VITALS — BP 154/93 | HR 91 | Ht 63.0 in | Wt 115.0 lb

## 2024-01-31 DIAGNOSIS — N2889 Other specified disorders of kidney and ureter: Secondary | ICD-10-CM

## 2024-01-31 LAB — MICROSCOPIC EXAMINATION

## 2024-01-31 LAB — URINALYSIS, ROUTINE W REFLEX MICROSCOPIC
Bilirubin, UA: NEGATIVE
Glucose, UA: NEGATIVE
Ketones, UA: NEGATIVE
Leukocytes,UA: NEGATIVE
Nitrite, UA: NEGATIVE
RBC, UA: NEGATIVE
Specific Gravity, UA: 1.025 (ref 1.005–1.030)
Urobilinogen, Ur: 0.2 mg/dL (ref 0.2–1.0)
pH, UA: 6 (ref 5.0–7.5)

## 2024-02-01 ENCOUNTER — Ambulatory Visit (HOSPITAL_COMMUNITY)
Admission: RE | Admit: 2024-02-01 | Discharge: 2024-02-01 | Disposition: A | Discharge status: Home / Self Care | Source: Ambulatory Visit | Attending: Nurse Practitioner | Admitting: Nurse Practitioner

## 2024-02-01 DIAGNOSIS — R1900 Intra-abdominal and pelvic swelling, mass and lump, unspecified site: Secondary | ICD-10-CM | POA: Insufficient documentation

## 2024-02-01 DIAGNOSIS — R590 Localized enlarged lymph nodes: Secondary | ICD-10-CM | POA: Diagnosis not present

## 2024-02-01 DIAGNOSIS — N2889 Other specified disorders of kidney and ureter: Secondary | ICD-10-CM | POA: Diagnosis not present

## 2024-02-01 LAB — GLUCOSE, CAPILLARY: Glucose-Capillary: 90 mg/dL (ref 70–99)

## 2024-02-01 MED ORDER — FLUDEOXYGLUCOSE F - 18 (FDG) INJECTION
5.7300 | Freq: Once | INTRAVENOUS | Status: AC
  Administered 2024-02-01: 5.73 via INTRAVENOUS

## 2024-02-04 ENCOUNTER — Telehealth: Payer: Self-pay | Admitting: Internal Medicine

## 2024-02-04 DIAGNOSIS — I10 Essential (primary) hypertension: Secondary | ICD-10-CM

## 2024-02-04 LAB — COLOGUARD: COLOGUARD: NEGATIVE

## 2024-02-04 MED ORDER — AMLODIPINE BESYLATE 5 MG PO TABS
5.0000 mg | ORAL_TABLET | Freq: Every day | ORAL | 1 refills | Status: AC
Start: 2024-02-04 — End: ?

## 2024-02-04 MED ORDER — TRIAMTERENE-HCTZ 37.5-25 MG PO TABS: 1.0000 | ORAL_TABLET | Freq: Every day | ORAL | 1 refills | Status: AC

## 2024-02-04 NOTE — Telephone Encounter (Signed)
 Refills have been sent.

## 2024-02-05 ENCOUNTER — Encounter: Payer: Self-pay | Admitting: Nurse Practitioner

## 2024-02-05 ENCOUNTER — Inpatient Hospital Stay: Attending: Nurse Practitioner | Admitting: Nurse Practitioner

## 2024-02-05 VITALS — BP 125/81 | HR 72 | Temp 97.8°F | Resp 18 | Ht 63.0 in | Wt 117.5 lb

## 2024-02-05 DIAGNOSIS — N2889 Other specified disorders of kidney and ureter: Secondary | ICD-10-CM | POA: Diagnosis not present

## 2024-02-05 DIAGNOSIS — R1900 Intra-abdominal and pelvic swelling, mass and lump, unspecified site: Secondary | ICD-10-CM | POA: Diagnosis not present

## 2024-02-05 DIAGNOSIS — Z86006 Personal history of melanoma in-situ: Secondary | ICD-10-CM | POA: Insufficient documentation

## 2024-02-05 DIAGNOSIS — D869 Sarcoidosis, unspecified: Secondary | ICD-10-CM | POA: Diagnosis not present

## 2024-02-05 DIAGNOSIS — Z85828 Personal history of other malignant neoplasm of skin: Secondary | ICD-10-CM | POA: Diagnosis not present

## 2024-02-05 DIAGNOSIS — Z85820 Personal history of malignant melanoma of skin: Secondary | ICD-10-CM | POA: Insufficient documentation

## 2024-02-05 DIAGNOSIS — E7800 Pure hypercholesterolemia, unspecified: Secondary | ICD-10-CM | POA: Diagnosis not present

## 2024-02-05 DIAGNOSIS — I10 Essential (primary) hypertension: Secondary | ICD-10-CM | POA: Diagnosis not present

## 2024-02-05 NOTE — Progress Notes (Signed)
  [facility] OFFICE PROGRESS NOTE   Diagnosis: Left renal mass  INTERVAL HISTORY:   Tamara Myers returns as scheduled.  She has mild intermittent discomfort at the left abdomen.  No bowel or bladder issues.  Objective:  Vital signs in last 24 hours:  Blood pressure 125/81, pulse 72, temperature 97.8 F (36.6 C), temperature source Temporal, resp. rate 18, height 5' 3 (1.6 m), weight 117 lb 8 oz (53.3 kg), SpO2 100%.    Resp: Lungs clear bilaterally. Cardio: Regular rate and rhythm. GI: Palpable mass throughout the left abdomen. Vascular: No leg edema.  Lab Results:  Lab Results  Component Value Date   WBC 2.7 (L) 01/18/2024   HGB 12.8 01/18/2024   HCT 39.3 01/18/2024   MCV 90.8 01/18/2024   PLT 232 01/18/2024   NEUTROABS 1,450 (L) 01/18/2024    Imaging:  No results found.  Medications: I have reviewed the patient's current medications.  Assessment/Plan: Large mass occupying the inferior pole calyces and pelvis of the left kidney on CT 01/24/2024 02/01/2024 PET scan-hypermetabolic large left kidney lower pole mass with tumor thrombus in the expanded left renal vein extending to the IVC and possible splenorenal collateral involvement.  Mildly hypermetabolic but small right axillary and bilateral hilar lymph nodes.  Mild left periaortic lymph node uptake near blood pool level.  Calcified thoracic lymph nodes compatible with prior granulomatous disease. 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 Kassie, maintained on infliximab  Hypertension Hypercholesterolemia  Disposition: Tamara Myers has a large left renal mass.  She has seen Dr. Roseann, urology.  The mass is felt to be very suspicious for renal cell carcinoma.  She has been referred to [facility] to consider surgical management.  She is scheduled to see Drs. Waymond armin Ee on 02/12/2024.  We reviewed the recent PET scan  results/images with Tamara Myers and her husband.  Anticipating she will have surgery in the next few weeks we scheduled follow-up here in about 5 weeks.  We are available to see her sooner if needed.  Patient seen with Dr. Cloretta.  Olam Ned ANP/GNP-BC   02/05/2024  11:28 AM This was a shared visit with Olam Ned.  We reviewed the staging PET findings and images with Tamara Myers and her husband. She saw Dr. Roseann.  I communicated with Dr. Roseann.  He feels the mass likely represents a renal cell carcinoma as opposed to urothelial cancer.  Tamara Myers has been referred to [facility] to consider resection of the mass and tumor thrombus.  There is no clear evidence of metastatic disease on the PET.  The small hypermetabolic lymph nodes are most likely benign.  She will return for an office visit after surgery.  We will consider adjuvant treatment options based on the operative findings and surgical pathology.  Arvella Cloretta, MD

## 2024-02-07 ENCOUNTER — Ambulatory Visit (HOSPITAL_BASED_OUTPATIENT_CLINIC_OR_DEPARTMENT_OTHER)

## 2024-02-07 DIAGNOSIS — C679 Malignant neoplasm of bladder, unspecified: Secondary | ICD-10-CM | POA: Diagnosis not present

## 2024-02-07 DIAGNOSIS — N2889 Other specified disorders of kidney and ureter: Secondary | ICD-10-CM | POA: Diagnosis not present

## 2024-02-07 DIAGNOSIS — R590 Localized enlarged lymph nodes: Secondary | ICD-10-CM | POA: Diagnosis not present

## 2024-02-07 DIAGNOSIS — K8689 Other specified diseases of pancreas: Secondary | ICD-10-CM | POA: Diagnosis not present

## 2024-02-12 DIAGNOSIS — R0689 Other abnormalities of breathing: Secondary | ICD-10-CM | POA: Diagnosis not present

## 2024-02-12 DIAGNOSIS — N2889 Other specified disorders of kidney and ureter: Secondary | ICD-10-CM | POA: Diagnosis not present

## 2024-02-12 DIAGNOSIS — D49519 Neoplasm of unspecified behavior of unspecified kidney: Secondary | ICD-10-CM | POA: Diagnosis not present

## 2024-02-13 ENCOUNTER — Telehealth (HOSPITAL_BASED_OUTPATIENT_CLINIC_OR_DEPARTMENT_OTHER): Payer: Self-pay

## 2024-02-13 ENCOUNTER — Telehealth: Payer: Self-pay | Admitting: Oncology

## 2024-02-13 NOTE — Telephone Encounter (Signed)
 Copied from CRM #[id number]. Topic: Clinical - Medical Advice >> Feb 13, 2024  1:55 PM Rozanna MATSU wrote: Reason for CRM: pt is having surgery on 11/03 and she is scheduled to the Anesthesiologist on 10/21 stated needs clearance letter this before this date [facility] [phone].

## 2024-02-13 NOTE — Telephone Encounter (Signed)
 PT CALLED TO CANCEL HER APPT, UNSPECIFIED REASON

## 2024-02-14 NOTE — Telephone Encounter (Signed)
 Tamara Myers, CMA to [facility] Clearance     02/13/24  2:00 PM Pt LOV 07/2023. I think she needs appt for this but not 100% sure as it may have been too long to give clearance  I have not received any request for clearance for this pt  Called the number provided- it was [name] hospital anesthesia  The pt is having procedure with Urology- Dr. Rollin- Gala Cheadle [phone]) Called Dr. Kyra office to ask them to fax the form and was placed on long hol (20 min) Will call back later to f/u on this

## 2024-02-19 DIAGNOSIS — N189 Chronic kidney disease, unspecified: Secondary | ICD-10-CM | POA: Diagnosis not present

## 2024-02-19 DIAGNOSIS — D49519 Neoplasm of unspecified behavior of unspecified kidney: Secondary | ICD-10-CM | POA: Diagnosis not present

## 2024-02-19 DIAGNOSIS — D49512 Neoplasm of unspecified behavior of left kidney: Secondary | ICD-10-CM | POA: Diagnosis not present

## 2024-02-19 DIAGNOSIS — Z7951 Long term (current) use of inhaled steroids: Secondary | ICD-10-CM | POA: Diagnosis not present

## 2024-02-19 DIAGNOSIS — Z79899 Other long term (current) drug therapy: Secondary | ICD-10-CM | POA: Diagnosis not present

## 2024-02-19 DIAGNOSIS — D869 Sarcoidosis, unspecified: Secondary | ICD-10-CM | POA: Diagnosis not present

## 2024-02-19 DIAGNOSIS — I129 Hypertensive chronic kidney disease with stage 1 through stage 4 chronic kidney disease, or unspecified chronic kidney disease: Secondary | ICD-10-CM | POA: Diagnosis not present

## 2024-02-19 DIAGNOSIS — J45909 Unspecified asthma, uncomplicated: Secondary | ICD-10-CM | POA: Diagnosis not present

## 2024-02-20 DIAGNOSIS — E042 Nontoxic multinodular goiter: Secondary | ICD-10-CM | POA: Diagnosis not present

## 2024-02-20 DIAGNOSIS — I2510 Atherosclerotic heart disease of native coronary artery without angina pectoris: Secondary | ICD-10-CM | POA: Diagnosis not present

## 2024-02-20 DIAGNOSIS — D49519 Neoplasm of unspecified behavior of unspecified kidney: Secondary | ICD-10-CM | POA: Diagnosis not present

## 2024-02-20 DIAGNOSIS — I82220 Acute embolism and thrombosis of inferior vena cava: Secondary | ICD-10-CM | POA: Diagnosis not present

## 2024-02-20 DIAGNOSIS — N2889 Other specified disorders of kidney and ureter: Secondary | ICD-10-CM | POA: Diagnosis not present

## 2024-02-20 DIAGNOSIS — K6389 Other specified diseases of intestine: Secondary | ICD-10-CM | POA: Diagnosis not present

## 2024-02-21 ENCOUNTER — Ambulatory Visit
Admission: RE | Admit: 2024-02-21 | Discharge: 2024-02-21 | Disposition: A | Source: Ambulatory Visit | Attending: Internal Medicine | Admitting: Internal Medicine

## 2024-02-21 DIAGNOSIS — Z1231 Encounter for screening mammogram for malignant neoplasm of breast: Secondary | ICD-10-CM | POA: Diagnosis not present

## 2024-02-26 DIAGNOSIS — R0689 Other abnormalities of breathing: Secondary | ICD-10-CM | POA: Diagnosis not present

## 2024-02-26 DIAGNOSIS — I071 Rheumatic tricuspid insufficiency: Secondary | ICD-10-CM | POA: Diagnosis not present

## 2024-02-26 DIAGNOSIS — D49519 Neoplasm of unspecified behavior of unspecified kidney: Secondary | ICD-10-CM | POA: Diagnosis not present

## 2024-02-27 ENCOUNTER — Telehealth (HOSPITAL_BASED_OUTPATIENT_CLINIC_OR_DEPARTMENT_OTHER): Payer: Self-pay

## 2024-02-27 NOTE — Telephone Encounter (Signed)
    Duplicate adding to previous encounter Copied from CRM #[id number]. Topic: Clinical - Medical Advice >> Feb 27, 2024 11:01 AM [name] wrote: Reason for CRM: Patients neurologists office at [facility] is requesting a call back from Dr. Kassie or nurse as patient has an upcoming radical nephrectomy, they are inquiring about additional clearance or if Dr. Kassie has any other concerns.   Please call [phone]  Called back the number provided and there was no answer- LMTCB

## 2024-02-27 NOTE — Telephone Encounter (Signed)
    Duplicate adding to previous encounter Copied from CRM #[id number]. Topic: Clinical - Medical Advice >> Feb 27, 2024 11:01 AM [name] wrote: Reason for CRM: Patients neurologists office at [facility] is requesting a call back from Dr. Kassie or nurse as patient has an upcoming radical nephrectomy, they are inquiring about additional clearance or if Dr. Kassie has any other concerns.  Please call [phone]

## 2024-02-29 ENCOUNTER — Other Ambulatory Visit (HOSPITAL_BASED_OUTPATIENT_CLINIC_OR_DEPARTMENT_OTHER): Payer: Self-pay

## 2024-02-29 DIAGNOSIS — M810 Age-related osteoporosis without current pathological fracture: Secondary | ICD-10-CM | POA: Diagnosis not present

## 2024-02-29 DIAGNOSIS — Z79899 Other long term (current) drug therapy: Secondary | ICD-10-CM | POA: Diagnosis not present

## 2024-02-29 DIAGNOSIS — I951 Orthostatic hypotension: Secondary | ICD-10-CM | POA: Diagnosis not present

## 2024-02-29 DIAGNOSIS — E785 Hyperlipidemia, unspecified: Secondary | ICD-10-CM | POA: Diagnosis not present

## 2024-02-29 DIAGNOSIS — J45909 Unspecified asthma, uncomplicated: Secondary | ICD-10-CM | POA: Diagnosis not present

## 2024-02-29 DIAGNOSIS — C7989 Secondary malignant neoplasm of other specified sites: Secondary | ICD-10-CM | POA: Diagnosis not present

## 2024-02-29 DIAGNOSIS — G8918 Other acute postprocedural pain: Secondary | ICD-10-CM | POA: Diagnosis not present

## 2024-02-29 DIAGNOSIS — I2510 Atherosclerotic heart disease of native coronary artery without angina pectoris: Secondary | ICD-10-CM | POA: Diagnosis not present

## 2024-02-29 DIAGNOSIS — I823 Embolism and thrombosis of renal vein: Secondary | ICD-10-CM | POA: Diagnosis not present

## 2024-02-29 DIAGNOSIS — M898X9 Other specified disorders of bone, unspecified site: Secondary | ICD-10-CM | POA: Diagnosis not present

## 2024-02-29 DIAGNOSIS — D869 Sarcoidosis, unspecified: Secondary | ICD-10-CM | POA: Diagnosis not present

## 2024-02-29 DIAGNOSIS — R42 Dizziness and giddiness: Secondary | ICD-10-CM | POA: Diagnosis not present

## 2024-02-29 DIAGNOSIS — D6859 Other primary thrombophilia: Secondary | ICD-10-CM | POA: Diagnosis not present

## 2024-02-29 DIAGNOSIS — Z8051 Family history of malignant neoplasm of kidney: Secondary | ICD-10-CM | POA: Diagnosis not present

## 2024-02-29 DIAGNOSIS — K219 Gastro-esophageal reflux disease without esophagitis: Secondary | ICD-10-CM | POA: Diagnosis not present

## 2024-02-29 DIAGNOSIS — C642 Malignant neoplasm of left kidney, except renal pelvis: Secondary | ICD-10-CM | POA: Diagnosis not present

## 2024-02-29 DIAGNOSIS — D49512 Neoplasm of unspecified behavior of left kidney: Secondary | ICD-10-CM | POA: Diagnosis not present

## 2024-02-29 DIAGNOSIS — Z7951 Long term (current) use of inhaled steroids: Secondary | ICD-10-CM | POA: Diagnosis not present

## 2024-02-29 DIAGNOSIS — R54 Age-related physical debility: Secondary | ICD-10-CM | POA: Diagnosis not present

## 2024-02-29 DIAGNOSIS — C649 Malignant neoplasm of unspecified kidney, except renal pelvis: Secondary | ICD-10-CM | POA: Diagnosis not present

## 2024-02-29 DIAGNOSIS — I82220 Acute embolism and thrombosis of inferior vena cava: Secondary | ICD-10-CM | POA: Diagnosis not present

## 2024-02-29 DIAGNOSIS — N189 Chronic kidney disease, unspecified: Secondary | ICD-10-CM | POA: Diagnosis not present

## 2024-02-29 DIAGNOSIS — I129 Hypertensive chronic kidney disease with stage 1 through stage 4 chronic kidney disease, or unspecified chronic kidney disease: Secondary | ICD-10-CM | POA: Diagnosis not present

## 2024-02-29 DIAGNOSIS — I10 Essential (primary) hypertension: Secondary | ICD-10-CM | POA: Diagnosis not present

## 2024-02-29 MED ORDER — PANTOPRAZOLE SODIUM 40 MG PO TBEC
40.0000 mg | DELAYED_RELEASE_TABLET | Freq: Every day | ORAL | 3 refills | Status: AC
Start: 2024-02-29 — End: ?

## 2024-03-05 ENCOUNTER — Other Ambulatory Visit: Admitting: Oncology

## 2024-03-05 NOTE — Telephone Encounter (Signed)
 Closing encounter- no call returned and according to chart review the procedure was already completed.

## 2024-03-07 ENCOUNTER — Encounter: Payer: Self-pay | Admitting: Internal Medicine

## 2024-03-07 ENCOUNTER — Telehealth: Admitting: Internal Medicine

## 2024-03-07 VITALS — BP 121/79 | HR 88

## 2024-03-07 DIAGNOSIS — C649 Malignant neoplasm of unspecified kidney, except renal pelvis: Secondary | ICD-10-CM

## 2024-03-07 DIAGNOSIS — Z8719 Personal history of other diseases of the digestive system: Secondary | ICD-10-CM | POA: Diagnosis not present

## 2024-03-07 DIAGNOSIS — E7800 Pure hypercholesterolemia, unspecified: Secondary | ICD-10-CM

## 2024-03-07 DIAGNOSIS — M8580 Other specified disorders of bone density and structure, unspecified site: Secondary | ICD-10-CM | POA: Diagnosis not present

## 2024-03-07 DIAGNOSIS — D860 Sarcoidosis of lung: Secondary | ICD-10-CM | POA: Diagnosis not present

## 2024-03-07 DIAGNOSIS — I10 Essential (primary) hypertension: Secondary | ICD-10-CM | POA: Diagnosis not present

## 2024-03-07 DIAGNOSIS — Z905 Acquired absence of kidney: Secondary | ICD-10-CM | POA: Diagnosis not present

## 2024-03-07 NOTE — Progress Notes (Addendum)
 Patient Care Team: Perri Ronal PARAS, MD as PCP - General (Internal Medicine) [place], [facility] Ophthalmology  I connected with Tamara Myers on 03/07/24 at 12 pm by video enabled telemedicine visit and verified that I am speaking with the correct person using two identifiers.   I discussed the limitations, risks, security and privacy concerns of performing an evaluation and management service by telemedicine and the availability of in-person appointments. I also discussed with the patient that there may be a patient responsible charge related to this service. The patient expressed understanding and agreed to proceed.   Other persons participating in the visit and their role in the encounter: Medical scribe, Nestora JAYSON Bathe  Patient's location: Home  Provider's location: Clinic   I provided 25 minutes of time  during this encounter, and > 50% was spent counseling as documented under my assessment & plan. This included chart review, interviewing patient, and making clinical assessment of her physical condition virtually.  Chief Complaint: Hypertension   Subjective:    Patient ID: Tamara Myers , Female    DOB: [birth year], 68 y.o.    MRN: [id number]   68 y.o. Female presents today for Hospital follow up. Patient has a past medical history of Basal Cell carcinoma, Bilateral cataracts, GE Reflux.   She said that she doesn't really have any energy. She hasn't been short of breath she hasn't had any fever or chills. She said that she does have a cough. She has home physical therapy coming for gait strengthening soon. She has been advised to drink plenty of water .   She was in this office for her annual wellness visit where a mass was felt in her left abdomen. 01/24/2024 CT showed Extremely large, bulky expansile mass occupying the inferior pole calices and pelvis of the left kidney measuring 12.4 x 11.2 x 8.2 cm. On 03/03/2024 she underwent a left nephrectomy and IVC thrombectomy. She initially had  trouble walking but improved soon after she was given IV fluids. She was discharged home in stable condition.  History of hypertension treated with amlodipine  5 mg daily, Triamterene -Hydrochlorothiazide 37.5-25 mg daily.  Advised to monitor blood pressure at home, can expect some fluctuations.  She has been advised that her blood pressure may increase once she becomes more active. Advised to stay well hydrated.  Past Medical History:  Diagnosis Date   Cancer (HCC)    basal cell carcinoma   Cataract    bilateral   Complication of anesthesia    slow to wake up   Dyspnea    on exertion   GERD (gastroesophageal reflux disease)    on meds   Hyperlipidemia    on meds   Hypertension    on meds   Melanoma (HCC) 2018   left upper arm   Moderate persistent asthma without complication 04/04/2018   on meds   Osteoporosis    confirmed by bone scan   PONV (postoperative nausea and vomiting)    Post menopausal syndrome    Pulmonary sarcoidosis    tx'd     Family History  Problem Relation Age of Onset   Osteoporosis Mother    Diabetes Father    Heart disease Father    Hyperlipidemia Father    Hypertension Father    Pulmonary fibrosis Father    Asthma Daughter    Breast cancer Neg Hx    Allergic rhinitis Neg Hx    Eczema Neg Hx    Urticaria Neg Hx  Angioedema Neg Hx    Colon polyps Neg Hx    Colon cancer Neg Hx    Esophageal cancer Neg Hx    Rectal cancer Neg Hx    Stomach cancer Neg Hx    Family History Narrative:  Father with history of CABG, diabetes, hyperlipidemia, hypertension died from respiratory failure.  He had pulmonary fibrosis.  Mother with history of Mnire's disease, macular degeneration and remote history of melanoma.    Social History Narrative:  Married with 2 adult children. She does not smoke or consume alcohol. She works for the town of [place].       Review of Systems  Respiratory:  Positive for cough.   All other systems reviewed and are  negative.  No fever, chills, vomiting.     Objective:   Vitals: There were no vitals taken for this visit.   Physical Exam Vitals and nursing note reviewed.     Patient seen virtually in no acute distress. Not tacyhpneic. Resting at home comfortably. Able to give clear concise history of events.  Results:      Labs:       Component Value Date/Time   NA 144 01/18/2024 0858   NA 142 03/22/2023 1113   K 4.2 01/18/2024 0858   CL 105 01/18/2024 0858   CO2 31 01/18/2024 0858   GLUCOSE 86 01/18/2024 0858   BUN 13 01/18/2024 0858   BUN 18 03/22/2023 1113   CREATININE 0.92 01/18/2024 0858   CALCIUM  9.7 01/18/2024 0858   PROT 6.6 01/18/2024 0858   PROT 6.6 03/22/2023 1113   ALBUMIN 4.1 10/24/2023 0956   ALBUMIN 4.5 03/22/2023 1113   AST 30 01/18/2024 0858   ALT 16 01/18/2024 0858   ALKPHOS 42 10/24/2023 0956   BILITOT 0.5 01/18/2024 0858   BILITOT 0.7 03/22/2023 1113   GFRNONAA 46 (L) 01/06/2020 0908   GFRAA 53 (L) 01/06/2020 0908     Lab Results  Component Value Date   WBC 2.7 (L) 01/18/2024   HGB 12.8 01/18/2024   HCT 39.3 01/18/2024   MCV 90.8 01/18/2024   PLT 232 01/18/2024    Lab Results  Component Value Date   CHOL 165 01/18/2024   HDL 84 01/18/2024   LDLCALC 61 01/18/2024   TRIG 112 01/18/2024   CHOLHDL 2.0 01/18/2024     Lab Results  Component Value Date   TSH 1.41 01/18/2024          Assessment & Plan:   She said that she does not have much energy but is in no acute distress. Seen virtually. She has not been short of breath, she has not  had any fever or chills. She said that she does have a slight cough. She has home physical therapy coming for gait strengthening soon. She has been advised to stay well hydrated. Walk about the home some to prevent atelectasis.  Renal Neoplasm: She was in this office for her annual wellness visit where a mass was felt in her left abdomen. 01/24/2024 CT showed Extremely large, bulky expansile mass occupying  the inferior pole calices and pelvis of the left kidney measuring 12.4 x 11.2 x 8.2.  On 03/03/2024 she underwent a left nephrectomy and IVC thrombectomy. She initially had trouble walking but improved soon after she was given IV fluids. She was discharged 3 days post op. Amlodipine  5 mg and Triamterene -hydrochlorothiazide were discontinued. She was prescribed Acetaminophen  325 mg, Eliquis 2.5 mg, Oxycodone 5 mg and Senexon 8.6-50 mg.  Hypertension:  Prior to surgery treated with amlodipine  5 mg daily, Triamterene -Hydrochlorothiazide 37.5-25 mg daily. She has been advised that her blood pressure will likely rise once she becomes more active again.   Amlodipine  and Triamterene -hydrochlorothiazide to remain discontinued until further notice.  Advised to monitor her blood pressure and contact the office if her blood pressure reaches 140/90 or higher.    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  I, Ronal JINNY Hailstone, MD, have reviewed all documentation for this visit. The documentation on 03/07/2024 for the exam, diagnosis, procedures, and orders are all accurate and complete.

## 2024-03-08 ENCOUNTER — Emergency Department (HOSPITAL_COMMUNITY)

## 2024-03-08 ENCOUNTER — Encounter (HOSPITAL_COMMUNITY): Payer: Self-pay

## 2024-03-08 ENCOUNTER — Other Ambulatory Visit: Payer: Self-pay

## 2024-03-08 ENCOUNTER — Emergency Department (HOSPITAL_COMMUNITY)
Admission: EM | Admit: 2024-03-08 | Discharge: 2024-03-09 | Disposition: A | Discharge status: Other Acute Inpatient Hospital | Attending: Emergency Medicine | Admitting: Emergency Medicine

## 2024-03-08 DIAGNOSIS — R109 Unspecified abdominal pain: Secondary | ICD-10-CM | POA: Diagnosis not present

## 2024-03-08 DIAGNOSIS — I10 Essential (primary) hypertension: Secondary | ICD-10-CM | POA: Diagnosis not present

## 2024-03-08 DIAGNOSIS — Z905 Acquired absence of kidney: Secondary | ICD-10-CM

## 2024-03-08 DIAGNOSIS — N99820 Postprocedural hemorrhage and hematoma of a genitourinary system organ or structure following a genitourinary system procedure: Secondary | ICD-10-CM | POA: Insufficient documentation

## 2024-03-08 DIAGNOSIS — R55 Syncope and collapse: Secondary | ICD-10-CM | POA: Diagnosis not present

## 2024-03-08 DIAGNOSIS — Z85820 Personal history of malignant melanoma of skin: Secondary | ICD-10-CM | POA: Insufficient documentation

## 2024-03-08 DIAGNOSIS — Z79899 Other long term (current) drug therapy: Secondary | ICD-10-CM | POA: Insufficient documentation

## 2024-03-08 DIAGNOSIS — J4540 Moderate persistent asthma, uncomplicated: Secondary | ICD-10-CM | POA: Diagnosis not present

## 2024-03-08 DIAGNOSIS — Z85828 Personal history of other malignant neoplasm of skin: Secondary | ICD-10-CM | POA: Insufficient documentation

## 2024-03-08 DIAGNOSIS — Z743 Need for continuous supervision: Secondary | ICD-10-CM | POA: Diagnosis not present

## 2024-03-08 DIAGNOSIS — R531 Weakness: Secondary | ICD-10-CM | POA: Diagnosis not present

## 2024-03-08 LAB — URINALYSIS, W/ REFLEX TO CULTURE (INFECTION SUSPECTED)
Bacteria, UA: NONE SEEN
Bilirubin Urine: NEGATIVE
Glucose, UA: NEGATIVE mg/dL
Hgb urine dipstick: NEGATIVE
Ketones, ur: 20 mg/dL — AB
Leukocytes,Ua: NEGATIVE
Nitrite: NEGATIVE
Protein, ur: NEGATIVE mg/dL
Specific Gravity, Urine: 1.021 (ref 1.005–1.030)
pH: 7 (ref 5.0–8.0)

## 2024-03-08 LAB — CBC WITH DIFFERENTIAL/PLATELET
Abs Immature Granulocytes: 0.04 K/uL (ref 0.00–0.07)
Basophils Absolute: 0 K/uL (ref 0.0–0.1)
Basophils Relative: 0 %
Eosinophils Absolute: 0.1 K/uL (ref 0.0–0.5)
Eosinophils Relative: 1 %
HCT: 24.6 % — ABNORMAL LOW (ref 36.0–46.0)
Hemoglobin: 8 g/dL — ABNORMAL LOW (ref 12.0–15.0)
Immature Granulocytes: 1 %
Lymphocytes Relative: 9 %
Lymphs Abs: 0.6 K/uL — ABNORMAL LOW (ref 0.7–4.0)
MCH: 29.9 pg (ref 26.0–34.0)
MCHC: 32.5 g/dL (ref 30.0–36.0)
MCV: 91.8 fL (ref 80.0–100.0)
Monocytes Absolute: 0.7 K/uL (ref 0.1–1.0)
Monocytes Relative: 9 %
Neutro Abs: 5.5 K/uL (ref 1.7–7.7)
Neutrophils Relative %: 80 %
Platelets: 281 K/uL (ref 150–400)
RBC: 2.68 MIL/uL — ABNORMAL LOW (ref 3.87–5.11)
RDW: 13.2 % (ref 11.5–15.5)
WBC: 6.9 K/uL (ref 4.0–10.5)
nRBC: 0 % (ref 0.0–0.2)

## 2024-03-08 LAB — COMPREHENSIVE METABOLIC PANEL WITH GFR
ALT: 133 U/L — ABNORMAL HIGH (ref 0–44)
AST: 96 U/L — ABNORMAL HIGH (ref 15–41)
Albumin: 3.2 g/dL — ABNORMAL LOW (ref 3.5–5.0)
Alkaline Phosphatase: 52 U/L (ref 38–126)
Anion gap: 8 (ref 5–15)
BUN: 19 mg/dL (ref 8–23)
CO2: 25 mmol/L (ref 22–32)
Calcium: 8.6 mg/dL — ABNORMAL LOW (ref 8.9–10.3)
Chloride: 101 mmol/L (ref 98–111)
Creatinine, Ser: 0.87 mg/dL (ref 0.44–1.00)
GFR, Estimated: >60 mL/min (ref >60)
Glucose, Bld: 104 mg/dL — ABNORMAL HIGH (ref 70–99)
Potassium: 4.4 mmol/L (ref 3.5–5.1)
Sodium: 134 mmol/L — ABNORMAL LOW (ref 135–145)
Total Bilirubin: 0.4 mg/dL (ref 0.0–1.2)
Total Protein: 4.9 g/dL — ABNORMAL LOW (ref 6.5–8.1)

## 2024-03-08 LAB — MAGNESIUM: Magnesium: 1.6 mg/dL — ABNORMAL LOW (ref 1.7–2.4)

## 2024-03-08 LAB — TROPONIN T, HIGH SENSITIVITY
Troponin T High Sensitivity: 20 ng/L — ABNORMAL HIGH (ref 0–19)
Troponin T High Sensitivity: <15 ng/L (ref 0–19)

## 2024-03-08 LAB — PREPARE RBC (CROSSMATCH)

## 2024-03-08 LAB — I-STAT CG4 LACTIC ACID, ED: Lactic Acid, Venous: 0.8 mmol/L (ref 0.5–1.9)

## 2024-03-08 LAB — ABO/RH: ABO/RH(D): O POS

## 2024-03-08 MED ORDER — SODIUM CHLORIDE 0.9% IV SOLUTION: Freq: Once | INTRAVENOUS | Status: AC

## 2024-03-08 MED ORDER — LACTATED RINGERS IV BOLUS
1000.0000 mL | Freq: Once | INTRAVENOUS | Status: AC
  Administered 2024-03-08: 1000 mL via INTRAVENOUS

## 2024-03-08 MED ORDER — IOHEXOL 300 MG/ML  SOLN
100.0000 mL | Freq: Once | INTRAMUSCULAR | Status: AC | PRN
  Administered 2024-03-08: 80 mL via INTRAVENOUS

## 2024-03-08 MED ORDER — MAGNESIUM SULFATE 2 GM/50ML IV SOLN
2.0000 g | Freq: Once | INTRAVENOUS | Status: AC
  Administered 2024-03-08: 2 g via INTRAVENOUS
  Filled 2024-03-08: qty 50

## 2024-03-08 MED ORDER — SODIUM CHLORIDE 0.9% IV SOLUTION: Freq: Once | INTRAVENOUS | Status: DC

## 2024-03-08 MED ORDER — ONDANSETRON HCL 4 MG/2ML IJ SOLN
4.0000 mg | Freq: Once | INTRAMUSCULAR | Status: DC
  Filled 2024-03-08: qty 2

## 2024-03-08 NOTE — ED Triage Notes (Signed)
 Patient presents from home. She had a kidney removed due to kidney cancer 3 days ago. Since, she has had episodes of diarrhea, hypotension and near syncopal episodes. EMS reports a syncopal episode with them has well as positive orthostatics. They administered 500 ml of fluid.    EMS vitals: 127/72 BP 80 HR 100 % SPO2 on room air 138 CBG

## 2024-03-08 NOTE — ED Provider Notes (Addendum)
 [place] EMERGENCY DEPARTMENT AT [facility] Provider Note  CSN: [id number] Arrival date & time: 03/08/24 1716  Chief Complaint(s) Near Syncope  HPI Tamara Myers is a 68 y.o. female who is here today for weakness, dizziness and lightheadedness with standing, near syncope.  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  She was discharged from [facility] on 11/6 with prescription for Eliquis.  She reports that over the last 2 days, she has been having frequent diarrhea, has been unable to stand up and ambulate on her own without feeling very lightheaded.  Husband has had to help hold the patient up and help her toilet.   Past Medical History Past Medical History:  Diagnosis Date   Cancer (HCC)    basal cell carcinoma   Cataract    bilateral   Complication of anesthesia    slow to wake up   Dyspnea    on exertion   GERD (gastroesophageal reflux disease)    on meds   Hyperlipidemia    on meds   Hypertension    on meds   Melanoma (HCC) 2018   left upper arm   Moderate persistent asthma without complication 04/04/2018   on meds   Osteoporosis    confirmed by bone scan   PONV (postoperative nausea and vomiting)    Post menopausal syndrome    Pulmonary sarcoidosis    tx'd   Patient Active Problem List   Diagnosis Date Noted   Left renal mass 01/31/2024   High risk medication use 11/16/2021   Rash 11/16/2021   Osteoporosis 05/03/2020   Sarcoidosis 11/06/2018   Moderate persistent asthma without complication 04/04/2018   LPRD (laryngopharyngeal reflux disease) 04/04/2018   Reactive airway disease 03/06/2018   History of dysplastic nevus 10/13/2014   History of basal cell carcinoma 10/13/2014   Fibrocystic breast disease 10/29/2011   Essential hypertension, benign 09/22/2010   Home Medication(s) Prior to Admission medications   Medication Sig Start Date End Date  Taking? Authorizing Provider  albuterol  (VENTOLIN  HFA) 108 (90 Base) MCG/ACT inhaler Inhale 2 puffs into the lungs every 6 (six) hours as needed for wheezing or shortness of breath. 01/18/22   Kassie Acquanetta Bradley, MD  amLODipine  (NORVASC ) 5 MG tablet Take 1 tablet (5 mg total) by mouth daily. Patient not taking: Reported on 03/07/2024 02/04/24   Perri Ronal PARAS, MD  budesonide -formoterol  (SYMBICORT ) 160-4.5 MCG/ACT inhaler Inhale 2 puffs twice daily 01/31/24   Kassie Acquanetta Bradley, MD  calcium  carbonate (OSCAL) 1500 (600 Ca) MG TABS tablet Take by mouth 2 (two) times daily with a meal.    [provider]  COMIRNATY syringe  12/28/22   [provider]  inFLIXimab -axxq (AVSOLA  IV) Inject into the vein. Infuse 5 mg/kg at weeks 0, 2, and 6 then maintenance of 5 mg/kg every 8 weeks thereafter (receives at [facility]) 07/12/22   Kassie Acquanetta Bradley, MD  ondansetron  (ZOFRAN ) 4 MG tablet Take 1 tablet (4 mg total) by mouth every 8 (eight) hours as needed for nausea or vomiting. 01/21/24   Perri Ronal PARAS, MD  pantoprazole  (PROTONIX ) 40 MG tablet Take 1 tablet (40 mg total) by mouth daily. 02/29/24   Kassie Acquanetta Bradley, MD  potassium chloride  SA (KLOR-CON  M) 20 MEQ tablet Take 1 tablet (20 mEq total) by mouth daily. 01/01/24   Perri Ronal PARAS, MD  rosuvastatin  (CRESTOR ) 10 MG tablet Take 1  tablet (10 mg total) by mouth daily. 11/09/23   Perri Ronal PARAS, MD  triamterene -hydrochlorothiazide (MAXZIDE-25) 37.5-25 MG tablet Take 1 tablet by mouth daily. Patient not taking: Reported on 03/07/2024 02/04/24   Perri Ronal PARAS, MD                                                                                                                                    Past Surgical History Past Surgical History:  Procedure Laterality Date   BREAST CYST ASPIRATION Right    CATARACT EXTRACTION, BILATERAL Bilateral 10/2020   CESAREAN SECTION     OPEN REDUCTION INTERNAL FIXATION (ORIF) METACARPAL Right 11/24/2014    Procedure: OPEN REDUCTION INTERNAL FIXATION (ORIF) RIGHT THUMB;  Surgeon: Prentice Pagan, MD;  Location: [facility] OR;  Service: Orthopedics;  Laterality: Right;   skin shave biopsy  02/08/2021   left mid lateral posterior arm   thumb surgery Right 2014   VIDEO BRONCHOSCOPY N/A 11/20/2018   Procedure: Video Bronchoscopy With Fluoro;  Surgeon: Brenna Adine CROME, DO;  Location: [facility] OR;  Service: Thoracic;  Laterality: N/A;   VIDEO BRONCHOSCOPY WITH ENDOBRONCHIAL ULTRASOUND N/A 11/20/2018   Procedure: VIDEO BRONCHOSCOPY WITH ENDOBRONCHIAL ULTRASOUND;  Surgeon: Brenna Adine CROME, DO;  Location: [facility] OR;  Service: Thoracic;  Laterality: N/A;   Family History Family History  Problem Relation Age of Onset   Osteoporosis Mother    Diabetes Father    Heart disease Father    Hyperlipidemia Father    Hypertension Father    Pulmonary fibrosis Father    Asthma Daughter    Breast cancer Neg Hx    Allergic rhinitis Neg Hx    Eczema Neg Hx    Urticaria Neg Hx    Angioedema Neg Hx    Colon polyps Neg Hx    Colon cancer Neg Hx    Esophageal cancer Neg Hx    Rectal cancer Neg Hx    Stomach cancer Neg Hx     Social History Social History   Tobacco Use   Smoking status: Never   Smokeless tobacco: Never  Vaping Use   Vaping status: Never Used  Substance Use Topics   Alcohol use: No   Drug use: No   Allergies Patient has no known allergies.  Review of Systems Review of Systems  Physical Exam Vital Signs  I have reviewed the triage vital signs BP 123/77 (BP Location: Right Arm)   Pulse (!) 109   Temp 97.7 F (36.5 C)   Resp 15   SpO2 96%   Physical Exam Vitals and nursing note reviewed.  Constitutional:      Appearance: She is ill-appearing.  HENT:     Head: Normocephalic and atraumatic.  Eyes:     Pupils: Pupils are equal, round, and reactive to light.  Cardiovascular:     Rate and Rhythm: Normal rate.  Pulmonary:     Effort: Pulmonary effort is normal.  Abdominal:     General:  Abdomen is flat. There is no distension.     Palpations: Abdomen is soft.     Tenderness: There is no abdominal tenderness.  Musculoskeletal:        General: Normal range of motion.  Skin:    General: Skin is warm and dry.     Comments: Well-appearing large midline surgical scar without any pus, purulence or erythema  Neurological:     General: No focal deficit present.     Mental Status: She is alert and oriented to person, place, and time.     Cranial Nerves: No cranial nerve deficit.     Motor: No weakness.     ED Results and Treatments Labs (all labs ordered are listed, but only abnormal results are displayed) Labs Reviewed  COMPREHENSIVE METABOLIC PANEL WITH GFR - Abnormal; Notable for the following components:      Result Value   Sodium 134 (*)    Glucose, Bld 104 (*)    Calcium  8.6 (*)    Total Protein 4.9 (*)    Albumin 3.2 (*)    AST 96 (*)    ALT 133 (*)    All other components within normal limits  CBC WITH DIFFERENTIAL/PLATELET - Abnormal; Notable for the following components:   RBC 2.68 (*)    Hemoglobin 8.0 (*)    HCT 24.6 (*)    Lymphs Abs 0.6 (*)    All other components within normal limits  MAGNESIUM - Abnormal; Notable for the following components:   Magnesium 1.6 (*)    All other components within normal limits  URINALYSIS, W/ REFLEX TO CULTURE (INFECTION SUSPECTED) - Abnormal; Notable for the following components:   APPearance HAZY (*)    Ketones, ur 20 (*)    All other components within normal limits  TROPONIN T, HIGH SENSITIVITY - Abnormal; Notable for the following components:   Troponin T High Sensitivity 20 (*)    All other components within normal limits  CULTURE, BLOOD (ROUTINE X 2)  CULTURE, BLOOD (ROUTINE X 2)  C DIFFICILE QUICK SCREEN W PCR REFLEX    I-STAT CG4 LACTIC ACID, ED  TYPE AND SCREEN  ABO/RH  TROPONIN T, HIGH SENSITIVITY                                                                                                                           Radiology CT ABDOMEN PELVIS W CONTRAST Result Date: 03/08/2024 EXAM: CT ABDOMEN AND PELVIS WITH CONTRAST 03/08/2024 08:33:32 PM TECHNIQUE: CT of the abdomen and pelvis was performed with the administration of 80 mL of iohexol  (OMNIPAQUE ) 300 MG/ML solution. Multiplanar reformatted images are provided for review. Automated exposure control, iterative reconstruction, and/or weight-based adjustment of the mA/kV was utilized to reduce the radiation dose to as low as reasonably achievable. COMPARISON: 01/24/2024 CLINICAL HISTORY: Abdominal pain, post-op FINDINGS: LOWER CHEST: Small bilateral pleural effusions, left greater than right, with associated left basilar atelectasis. LIVER: Mildly  heterogeneous enhancement inferiorly in the right hepatic lobe, without focal mass. GALLBLADDER AND BILE DUCTS: Gallbladder is unremarkable. No biliary ductal dilatation. SPLEEN: No acute abnormality. PANCREAS: No acute abnormality. ADRENAL GLANDS: No acute abnormality. KIDNEYS, URETERS AND BLADDER: Status post left nephrectomy with large hematoma/seroma in the surgical bed. Mild nondependent gas in the bladder, likely related to recent intervention. No stones in the right kidney or ureter. No right hydronephrosis. No perinephric or periureteral stranding on the right. GI AND BOWEL: Stomach demonstrates no acute abnormality. There is no bowel obstruction. PERITONEUM AND RETROPERITONEUM: Moderate abdominopelvic ascites. Status post retroperitoneal lymph node dissection with localized retroperitoneal soft tissue gas (image 37), likely postsurgical. No free air. VASCULATURE: Aorta is normal in caliber. LYMPH NODES: No lymphadenopathy. REPRODUCTIVE ORGANS: No acute abnormality. BONES AND SOFT TISSUES: Mild anterior wedging of multiple mid to lower thoracic vertebral bodies, likely chronic. Large hematoma/seroma in the left nephrectomy surgical bed. Localized retroperitoneal soft tissue gas (image 37), likely  postsurgical. IMPRESSION: 1. Status post left nephrectomy with large hematoma/seroma in the surgical bed. 2. Status post retroperitoneal lymph node dissection with localized retroperitoneal soft tissue gas, likely postsurgical. 3. Small bilateral pleural effusions, left greater than right, with associated left basilar atelectasis. Moderate abdominopelvic ascites. Electronically signed by: Pinkie Pebbles MD 03/08/2024 08:41 PM EST RP Workstation: [id number]    Pertinent labs & imaging results that were available during my care of the patient were reviewed by me and considered in my medical decision making (see MDM for details).  Medications Ordered in ED Medications  lactated ringers  bolus 1,000 mL (1,000 mLs Intravenous Bolus 03/08/24 1814)  magnesium sulfate IVPB 2 g 50 mL (0 g Intravenous Stopped 03/08/24 2030)  iohexol  (OMNIPAQUE ) 300 MG/ML solution 100 mL (80 mLs Intravenous Contrast Given 03/08/24 2022)                                                                                                                                     Procedures .Critical Care  Performed by: Mannie Fairy DASEN, DO Authorized by: Mannie Fairy DASEN, DO   Critical care provider statement:    Critical care time (minutes):  35   Critical care was necessary to treat or prevent imminent or life-threatening deterioration of the following conditions: Post surgical bleeding.   Critical care was time spent personally by me on the following activities:  Development of treatment plan with patient or surrogate, discussions with consultants, evaluation of patient's response to treatment, examination of patient, ordering and review of laboratory studies, ordering and review of radiographic studies, ordering and performing treatments and interventions, pulse oximetry, re-evaluation of patient's condition and review of old charts   (including critical care time)  Medical Decision Making / ED Course   This patient presents  to the ED for concern of near syncope, this involves an extensive number of treatment options, and is a complaint that carries with it a high risk of  complications and morbidity.  The differential diagnosis includes near syncope, anemia, dehydration, electrolyte abnormalities, deconditioning, postop infection.  MDM: On exam, patient is normotensive, nontachycardic.  She does however appear quite fatigued and overall weak.  Story is concerning for potential postoperative complications including anemia or infection.  Patient afebrile.  Will provide patient with some IV fluids, will check CBC and CMP.  Will obtain imaging of the patient's abdomen and pelvis.    Patient's husband at bedside helps provide history.  Reassessment 10:25 PM-patient CT imaging shows a large hematoma, I measured at 9 cm.  She does have a hemoglobin drop from 9.6-8.0.  Spoke with the urology team at [facility] and spoke with Dr. Cara who agreed with transfer to [facility].  Patient still pending type and screen, will plan to transfuse the patient.  Will give a very slow transfusion rate given presence of pleural effusions.  Patient had been laying somewhat flat, was feeling a bit short of breath.  Was put on 2 L for comfort.  Reassessment 10:50 PM-we have a truck available for the patient, her blood is being hung.  Just prior to transfer, patient started complaining of feeling a bit warm, felt as though she was having another one of her near syncopal episodes.  She did have a couple episodes of low blood pressure, but returned to a MAP of 80 without any intervention.  She is appropriate for transport at this time.  Additional history obtained: -Additional history obtained from husband at bedside -External records from outside source obtained and reviewed including: Chart review including previous notes, labs, imaging, consultation notes   Lab Tests: -I ordered, reviewed, and interpreted labs.   The pertinent results include:   Labs  Reviewed  COMPREHENSIVE METABOLIC PANEL WITH GFR - Abnormal; Notable for the following components:      Result Value   Sodium 134 (*)    Glucose, Bld 104 (*)    Calcium  8.6 (*)    Total Protein 4.9 (*)    Albumin 3.2 (*)    AST 96 (*)    ALT 133 (*)    All other components within normal limits  CBC WITH DIFFERENTIAL/PLATELET - Abnormal; Notable for the following components:   RBC 2.68 (*)    Hemoglobin 8.0 (*)    HCT 24.6 (*)    Lymphs Abs 0.6 (*)    All other components within normal limits  MAGNESIUM - Abnormal; Notable for the following components:   Magnesium 1.6 (*)    All other components within normal limits  URINALYSIS, W/ REFLEX TO CULTURE (INFECTION SUSPECTED) - Abnormal; Notable for the following components:   APPearance HAZY (*)    Ketones, ur 20 (*)    All other components within normal limits  TROPONIN T, HIGH SENSITIVITY - Abnormal; Notable for the following components:   Troponin T High Sensitivity 20 (*)    All other components within normal limits  CULTURE, BLOOD (ROUTINE X 2)  CULTURE, BLOOD (ROUTINE X 2)  C DIFFICILE QUICK SCREEN W PCR REFLEX    I-STAT CG4 LACTIC ACID, ED  TYPE AND SCREEN  ABO/RH  TROPONIN T, HIGH SENSITIVITY      EKG sinus tachycardia, no acute ischemia.  EKG Interpretation Date/Time:  Saturday March 08 2024 17:43:34 EST Ventricular Rate:  94 PR Interval:  139 QRS Duration:  88 QT Interval:  350 QTC Calculation: 438 R Axis:   75  Text Interpretation: Sinus rhythm Atrial premature complex Low voltage, precordial leads Confirmed by Mannie,  Fairy [phone]) on 03/08/2024 7:11:31 PM         Imaging Studies ordered: I ordered imaging studies including CT abdomen pelvis I independently visualized and interpreted imaging. I agree with the radiologist interpretation   Medicines ordered and prescription drug management: Meds ordered this encounter  Medications   lactated ringers  bolus 1,000 mL   magnesium sulfate IVPB 2 g  50 mL   iohexol  (OMNIPAQUE ) 300 MG/ML solution 100 mL    -I have reviewed the patients home medicines and have made adjustments as needed  Critical interventions Management of postoperative bleeding, hemoglobin drop  Cardiac Monitoring: The patient was maintained on a cardiac monitor.  I personally viewed and interpreted the cardiac monitored which showed an underlying rhythm of: Normal sinus rhythm   Reevaluation: After the interventions noted above, I reevaluated the patient and found that they have :improved  Co morbidities that complicate the patient evaluation  Past Medical History:  Diagnosis Date   Cancer (HCC)    basal cell carcinoma   Cataract    bilateral   Complication of anesthesia    slow to wake up   Dyspnea    on exertion   GERD (gastroesophageal reflux disease)    on meds   Hyperlipidemia    on meds   Hypertension    on meds   Melanoma (HCC) 2018   left upper arm   Moderate persistent asthma without complication 04/04/2018   on meds   Osteoporosis    confirmed by bone scan   PONV (postoperative nausea and vomiting)    Post menopausal syndrome    Pulmonary sarcoidosis    tx'd      Dispostion: Transfer to [facility].    Final Clinical Impression(s) / ED Diagnoses Final diagnoses:  Near syncope  Hemorrhage following nephrectomy     @PCDICTATION @    Mannie Fairy T, DO 03/08/24 2231    Mannie Fairy T, DO 03/08/24 2238    Mannie Fairy T, DO 03/08/24 2302    Mannie Fairy T, DO 03/08/24 2304

## 2024-03-09 DIAGNOSIS — R188 Other ascites: Secondary | ICD-10-CM | POA: Diagnosis not present

## 2024-03-09 DIAGNOSIS — J9811 Atelectasis: Secondary | ICD-10-CM | POA: Diagnosis not present

## 2024-03-09 LAB — PREPARE RBC (CROSSMATCH)

## 2024-03-09 NOTE — ED Notes (Signed)
 Blood was still going when pt left, just started.

## 2024-03-11 NOTE — Discharge Summary (Signed)
 ------------------------------------------------------------------------------- Attestation signed by Waymond James, MD at 03/12/24 1740 I was immediately available via phone/pager or present on site. I reviewed and discussed the discharge summary with the resident, but did not see the patient.  I agree with the assessment and plan as documented in the resident's note. Hung-Jui  Tan, MD  The patient's presentation is complicated by the following clinically significant conditions requiring additional evaluation and treatment or having a significant effect of this patient's care: - Hypercoagulable state requiring additional attention to DVT prophylaxis and treatment or chronic anticoagulation secondary to Malignancy  - Bleeding in the setting of medications including at least one of the following: DOAC, warfarin, clopidogrel, dual antiplatelet therapy, or other anticoagulant therapy causing medication-induced coagulopathy POA requiring further treatment, investigation or monitoring - Age related debility POA requiring additional resources: DME, PT, or OT - Anemia requiring at least daily CBC for further monitoring - Metastatic cancer POA requiring further investigation, treatment, or monitoring - Immunocompromise state POA requiring further investigation, treatment, or monitoring  Body mass index is 21.42 kg/m.       Wt Readings from Last 12 Encounters:  03/11/24 53.1 kg (117 lb 1.6 oz)  03/05/24 57.5 kg (126 lb 12.2 oz)  02/19/24 52 kg (114 lb 9.6 oz)  02/12/24 52 kg (114 lb 9.6 oz)   -------------------------------------------------------------------------------   [facility] Urology Discharge Summary  Admit date: 03/09/2024  Discharge date and time: 03/12/24   Discharge to:  Home  Discharge Service: Surg Urology (SRU)  Discharge Attending Physician: Dr. Levander Waymond  Discharge  Diagnoses: Postoperative hematoma involving genitourinary system following genitourinary procedure  Secondary  Diagnosis:  Principal Problem:   Postoperative hematoma involving genitourinary system following genitourinary procedure S/p L nephrectomy HTN HLD Sarcoidosis  OR Procedures:  None   Ancillary Procedures: no procedures  Condition at Discharge: good Patient is back to their functional baseline and is self-managing all activites of daily living.   Discharge Day Services: The patient was seen and examined by the Urology team both in the morning and immediately prior to discharge.  Vital signs and laboratory values were stable and within normal limits.  The physical exam was benign and unchanged and all surgical wounds were examined.  Discharge instructions were explained and all questions answered.  Subjective  No acute events overnight. Pain Controlled. No fever or chills. VSS. Denies any dizziness/nausea with ambulation  Objective Patient Vitals for the past 8 hrs:  BP Temp Temp src Pulse Resp SpO2  03/12/24 1013 144/86 36.5 C (97.7 F) Oral 94 -- 95 %  03/12/24 0354 152/87 36.8 C (98.2 F) Oral 104 18 100 %   I/O this shift: In: -  Out: 100 [Urine:100]  General Appearance:    No acute distress Lungs:                 Normal work of breathing on room air Heart:                            Regular rate and rhythm Abdomen:                 Soft, non-tender, non-distended GU:     Voiding spontaneously Extremities:               Warm and well perfused    Hospital Course:  HPI: 68 y.o. female with PMH of left renal mass s/p left nephrectomy and IVC thrombectomy on 03/03/2024. Patient had new orthostatic  hypotension that medicine was consulted for and recommended checking TSH and cortisol labs which were WNL. Had compression stockings placed and was treated with IVF. Discharged home on POD3 after improvement in symptoms, now transferred back for dizziness/pre-syncope on 03/08/24. At OSH was given x1 unit of blood for hgb of 8.0, also given IVF. CT scan done showing possible  hematoma/seroma in surgical bed. Also had small bilateral pleural effusions with moderate abdominal ascites. Transferred to [facility] for continued management 03/09/24.  Was transfused 2 units of pRBCs during admission. Hgb remained stable and responded appropriately. Started on guar gum for loose stools, GIPP and C diff negative. Stabilized on PRN imodium. CTA scan on 11/10 showed no active extravasation. Patient's vitals remained stable off IVF and was able to ambulate appropriately without dizziness/nausea. On HD4 patient was found to be meeting all goals for discharge. She was tolerating a regular solid diet, was able to void spontaneously, have adequate pain control with P.O. pain medication, and could ambulate without difficulty.   Will plan to have follow-up on 03/24/24 with Dr. Tan at [facility]. Discharged home on Lovenox for one month after surgery. Instructed to talk to PCP about resuming home BP medications.    Body mass index is 21.42 kg/m.   Wt Readings from Last 3 Encounters:  03/11/24 53.1 kg (117 lb 1.6 oz)  03/05/24 57.5 kg (126 lb 12.2 oz)  02/19/24 52 kg (114 lb 9.6 oz)                      Condition at Discharge: Improved Discharge Medications:    Your Medication List     PAUSE taking these medications    amlodipine  5 MG tablet Wait to take this until your doctor or other care provider tells you to start again. Commonly known as: NORVASC  Take 1 tablet (5 mg total) by mouth daily. TAKE 1 TABLET (5 MG TOTAL) BY MOUTH DAILY.   triamterene -hydroCHLOROthiazide 37.5-25 mg per tablet Wait to take this until your doctor or other care provider tells you to start again. Commonly known as: MAXZIDE-25 Take 1 tablet by mouth daily.       STOP taking these medications    ELIQUIS 2.5 mg Tab Generic drug: apixaban       START taking these medications    enoxaparin 30 mg/0.3 mL Syrg Commonly known as: LOVENOX Inject 0.3 mL (30 mg total) under the skin daily for  19 days. Start taking on: March 13, 2024   loperamide 2 mg capsule Commonly known as: IMODIUM Take 1 capsule (2 mg total) by mouth four (4) times a day as needed for up to 7 days.       CONTINUE taking these medications    acetaminophen  325 MG tablet Commonly known as: Tylenol  Take 3 tablets (975 mg total) by mouth every six (6) hours for 7 days.   albuterol  90 mcg/actuation inhaler Commonly known as: PROVENTIL  HFA;VENTOLIN  HFA Inhale 2 puffs.   budesonide -formoterol  160-4.5 mcg/actuation inhaler Commonly known as: SYMBICORT  Inhale 2 puffs twice daily   calcium  carbonate 1,500 mg (600 mg elem calcium ) tablet Take by mouth.   DRY EYES OPHT Administer 1 drop to both eyes two (2) times a day.   ondansetron  4 MG tablet Commonly known as: ZOFRAN  Take 1 tablet (4 mg total) by mouth.   pantoprazole  40 MG tablet Commonly known as: Protonix  Take 1 tablet (40 mg total) by mouth daily.   potassium chloride  20 MEQ ER tablet Take 1  tablet (20 mEq total) by mouth daily. TAKE 1 TABLET (20 MEQ TOTAL) BY MOUTH DAILY.   rosuvastatin  10 MG tablet Commonly known as: CRESTOR  Take 1 tablet (10 mg total) by mouth daily. TAKE 1 TABLET (10 MG TOTAL) BY MOUTH DAILY.   SENEXON-S 8.6-50 mg Generic drug: senna-docusate Take 1 tablet by mouth daily for 7 days.   VITAMIN D3 ORAL Take 1 tablet by mouth daily.       ASK your doctor about these medications    oxyCODONE 5 MG immediate release tablet Commonly known as: ROXICODONE Take 1 tablet (5 mg total) by mouth every four (4) hours as needed for pain. Ask about: Should I take this medication?        Pending Test Results:   Discharge Instructions:  Appointments which have been scheduled for you    Mar 20, 2024 11:30 AM (Arrive by 11:15 AM) POST OP URO with Hoy Silver Hight, FNP [facility] UROLOGY SERVICE [place] [facility] REG[facility]) [address] [facility] [address] [place]  [address] [phone]     Apr 17, 2024 11:00 AM (Arrive by 10:45 AM) MRI Brain With and Without Contrast with HBR MRI RM 2 IMG MRI [facility]) [address] [place] [address] [phone]  On appt date: Bring recent lab work Bring documentation of any metal object implants Take meds as usual Check w/physician if diabetic You will be asked to change into a gown for your safety  On appt date do not: No restrictions on food/drink Wear metallic items including jewelry (we are not responsible for lost items)  Let us  know if pt: Claustrophobic Metal object implant Pregnant Prescribed a sedative On dialysis Allergic to MRI dye/contrast Kidney Failure  (Title:MRIWCNTRST)    Apr 21, 2024 10:00 AM (Arrive by 9:30 AM) RETURN ACTIVE [facility] with Randine Macario Ee, MD [facility] ONCOLOGY MULTIDISCIPLINARY 2ND FLR CANCER HOSP [facility] REGION) [address] [place] [address] [phone]         Resources and Referrals   [facility] at [phone].  Referral accepted for home health PT and OT ROC.     Follow Up instructions and Outpatient Referrals    Ambulatory Referral to Home Health     Reason for referral: PT/OT   Physician to follow patient's care: PCP   Disciplines requested:  Physical Therapy Occupational Therapy     Physical Therapy requested:  Home safety evaluation Strengthening exercises Evaluate and treat     Occupational Therapy Requested:  Home safety evaluation Strengthening exercises ADL or IADL training Evaluate and treat     Requested [facility] Date: 03/13/2024   Discharge instructions      Other Instructions     Discharge instructions     GENERAL DISCHARGE INSTRUCTIONS   Appointment: If you have not already been scheduled, you should soon receive a call to schedule a follow-up appointment. If you do not hear from us  over the next few business days, please use the Urology Clinic  number below to call and schedule your appointment. We will request follow up with Dr. Tan on 03/24/24 at [facility].  Medications:  Tylenol : You have been prescribed extra-strength Tylenol  for pain control. We recommend that you take Tylenol  as scheduled (1,000 mg every 6 hours) while awake in the first 3-4 days following discharge. Do not exceed 4,000mg  of Tylenol  per day.  Oxycodone: You may also be prescribed narcotic pain pills to take only when your  pain symptoms are not controlled with non-narcotic medications. These may cause drowsiness, nausea, or confusion. Please stop taking if you experience this symptoms. Do not drive while taking these medications.  Lovenox: If prescribed Lovenox, please take as instructed for approximately a month after surgery to prevent blood clots.  Other: Please contact your primary care provider, regarding restarting your home blood pressure medications   Tubes and Drains: Not applicable.   Warning Signs: Always call for: fever over 101F, difficulty breathing, chest pain, palpitations, vomiting, swelling or pain in one leg. Come to the Emergency room if the office is closed.  Contact Information: If you need assistance weekdays between 8:00AM - 4:00PM, please call the Urology clinic at [phone]. If you have a nighttime or weekend EMERGENCY, please call [phone] and ask to speak to the Urology resident on call. Note, we are handling all incoming hospital and outside calls, so please be patient and we will be in touch with you as soon as possible.

## 2024-03-12 LAB — TYPE AND SCREEN
ABO/RH(D): O POS
Antibody Screen: NEGATIVE
Unit division: 0
Unit division: 0

## 2024-03-12 LAB — BPAM RBC
Blood Product Expiration Date: 202512122359
Blood Product Expiration Date: 202512122359
ISSUE DATE / TIME: 202511082309
Unit Type and Rh: 5100
Unit Type and Rh: 5100

## 2024-03-12 NOTE — ED Notes (Addendum)
 Lab called 03/12/24 at 6:30pm. Stated 1/4 bottles positive anaerobic bottle. Gram positive rods for blood cultures. EDP Dr. Benton Shone notified. No new orders.

## 2024-03-12 NOTE — Progress Notes (Signed)
 Pt discharged home, IV removed. Vital signs WNL. Discharge education was provided to the patient and family bedside. Patient verbalized understanding of medications, follow up and when/ how to notify MD. Medication was handed to the to patient from the pharmacy. Patient stated no signs of distress and left with all her personal belongings.

## 2024-03-12 NOTE — Telephone Encounter (Signed)
 UROLOGY FOLLOW UP REQUEST  Requesting physician/contact for questions: Salil Ghamande, MD    Date(s) Needed: 03/24/24  Appt to be scheduled under: Dr. Waymond   Type of Appt: Post-op s/p Nephrectomy  Need to contact patient: Yes please  Special Requests/Instructions: Yes - Please cancel appointment on 03/20/24  Orders placed for Special Requests: N/A  Thank you!

## 2024-03-13 LAB — CULTURE, BLOOD (ROUTINE X 2)
Culture: NO GROWTH
Special Requests: ADEQUATE

## 2024-03-14 ENCOUNTER — Telehealth (INDEPENDENT_AMBULATORY_CARE_PROVIDER_SITE_OTHER): Payer: Self-pay | Admitting: Internal Medicine

## 2024-03-14 ENCOUNTER — Encounter: Payer: Self-pay | Admitting: Internal Medicine

## 2024-03-14 DIAGNOSIS — I10 Essential (primary) hypertension: Secondary | ICD-10-CM

## 2024-03-14 DIAGNOSIS — R030 Elevated blood-pressure reading, without diagnosis of hypertension: Secondary | ICD-10-CM

## 2024-03-14 DIAGNOSIS — Z905 Acquired absence of kidney: Secondary | ICD-10-CM

## 2024-03-14 NOTE — Patient Instructions (Addendum)
 It was good to see you on video today. Walk about home some to prevent atelectasis. Stay well hydrated. Hold blood pressure medication for now. Monitor blood pressure. Call if questions or concerns arise.

## 2024-03-14 NOTE — Telephone Encounter (Signed)
 Received phone call from patient this afternoon regarding elevated blood pressure readings.  She is recovering at home after undergoing surgery at [facility] including left nephrectomy for left renal mass and IVC thrombectomy on 03/03/2024.  Spoke with patient by phone today.  She is at her home and I am at my office.  She is agreeable to visit in this format today.  She is identified using 2 identifiers as Chasady S Presas, a patient in this practice.  Postoperatively at home her blood pressure had been normal off antihypertensive medication previously prescribed including Maxide 25 and amlodipine  5 mg daily.  Patient is concerned with the weekend coming that her blood pressure was elevated a bit today.  Reported that her blood pressure had been 149/89, 150/91 and 159/90.  Pulse at rest was 92 and regular.  She has no headache, no shortness of breath, no chest pain, no fever, chills, nausea or vomiting.  We discussed that it is very likely her blood pressure will increase as she becomes more active.  We have discussed taking one half of amlodipine  5 mg tablet daily for the next couple of days and monitoring her blood pressure.  If blood pressure stays elevated above 140/90 I think it is okay to increase amlodipine  to 5 mg daily.  She knows she can call me if she has any questions or concerns.  MJB, MD

## 2024-03-17 LAB — CULTURE, BLOOD (ROUTINE X 2): Special Requests: ADEQUATE

## 2024-03-19 ENCOUNTER — Ambulatory Visit (HOSPITAL_BASED_OUTPATIENT_CLINIC_OR_DEPARTMENT_OTHER): Admitting: Pulmonary Disease

## 2024-03-19 ENCOUNTER — Ambulatory Visit

## 2024-03-24 ENCOUNTER — Ambulatory Visit (HOSPITAL_BASED_OUTPATIENT_CLINIC_OR_DEPARTMENT_OTHER): Admitting: Pulmonary Disease

## 2024-03-24 DIAGNOSIS — C649 Malignant neoplasm of unspecified kidney, except renal pelvis: Secondary | ICD-10-CM | POA: Diagnosis not present

## 2024-03-24 DIAGNOSIS — N2889 Other specified disorders of kidney and ureter: Secondary | ICD-10-CM | POA: Diagnosis not present

## 2024-03-24 DIAGNOSIS — Z4889 Encounter for other specified surgical aftercare: Secondary | ICD-10-CM | POA: Diagnosis not present

## 2024-03-25 ENCOUNTER — Inpatient Hospital Stay: Attending: Nurse Practitioner | Admitting: Oncology

## 2024-03-25 VITALS — BP 123/86 | HR 95 | Temp 97.8°F | Resp 18 | Ht 63.0 in | Wt 108.1 lb

## 2024-03-25 DIAGNOSIS — C649 Malignant neoplasm of unspecified kidney, except renal pelvis: Secondary | ICD-10-CM | POA: Insufficient documentation

## 2024-03-25 DIAGNOSIS — C642 Malignant neoplasm of left kidney, except renal pelvis: Secondary | ICD-10-CM | POA: Insufficient documentation

## 2024-03-25 DIAGNOSIS — C7951 Secondary malignant neoplasm of bone: Secondary | ICD-10-CM | POA: Diagnosis not present

## 2024-03-25 DIAGNOSIS — Z905 Acquired absence of kidney: Secondary | ICD-10-CM | POA: Insufficient documentation

## 2024-03-25 DIAGNOSIS — C787 Secondary malignant neoplasm of liver and intrahepatic bile duct: Secondary | ICD-10-CM | POA: Insufficient documentation

## 2024-03-25 NOTE — Progress Notes (Signed)
 [facility] OFFICE PROGRESS NOTE   Diagnosis: Renal carcinoma-chromophobe  INTERVAL HISTORY:   Tamara Myers underwent a radical left nephrectomy on 03/03/2024.  Surgery was complicated by readmission 03/29/2024 with orthostatic hypotension and a postoperative hematoma.  She was discharged to home 03/12/2024.  She has been evaluated by the urology and medical oncology services at [facility].  There is suspicion of metastatic disease involving the liver, lumbar spine, and skull.  She is scheduled undergo additional diagnostic evaluation at [facility] next month.  She will follow-up with medical oncology and radiation oncology after additional imaging. Ms. Zaucha continues to recover from surgery.  Her energy level and appetite are improving.  She reports chronic mid back pain.  She continues Lovenox for DVT prophylaxis. Objective:  Vital signs in last 24 hours:  Blood pressure 123/86, pulse 95, temperature 97.8 F (36.6 C), temperature source Temporal, resp. rate 18, height 5' 3 (1.6 m), weight 108 lb 1.6 oz (49 kg), SpO2 100%.    Lymphatics: No cervical, supraclavicular, axillary, or inguinal nodes Resp: Coarse end inspiratory rhonchi at the right posterior base, no respiratory distress Cardio: Regular rate and rhythm GI: No hepatosplenomegaly, no mass, healed surgical incision Vascular: No leg edema   Lab Results:  Lab Results  Component Value Date   WBC 6.9 03/08/2024   HGB 8.0 (L) 03/08/2024   HCT 24.6 (L) 03/08/2024   MCV 91.8 03/08/2024   PLT 281 03/08/2024   NEUTROABS 5.5 03/08/2024    CMP  Lab Results  Component Value Date   NA 134 (L) 03/08/2024   K 4.4 03/08/2024   CL 101 03/08/2024   CO2 25 03/08/2024   GLUCOSE 104 (H) 03/08/2024   BUN 19 03/08/2024   CREATININE 0.87 03/08/2024   CALCIUM  8.6 (L) 03/08/2024   PROT 4.9 (L) 03/08/2024   ALBUMIN 3.2 (L) 03/08/2024   AST 96 (H) 03/08/2024   ALT 133 (H) 03/08/2024   ALKPHOS 52 03/08/2024   BILITOT 0.4 03/08/2024    GFRNONAA >60 03/08/2024   GFRAA 53 (L) 01/06/2020    No results found for: CEA1, CEA, CAN199, CA125  Lab Results  Component Value Date   INR 1.0 11/20/2018   LABPROT 13.0 11/20/2018    Imaging:  No results found.  Medications: I have reviewed the patient's current medications.   Assessment/Plan: Renal cell carcinoma, chromophobe, pT3c, pN0 large mass occupying the inferior pole calyces and pelvis of the left kidney on CT 01/24/2024 02/01/2024 PET scan-hypermetabolic large left kidney lower pole mass with tumor thrombus in the expanded left renal vein extending to the IVC and possible splenorenal collateral involvement.  Mildly hypermetabolic but small right axillary and bilateral hilar lymph nodes.  Mild left periaortic lymph node uptake near blood pool level.  Calcified thoracic lymph nodes compatible with prior granulomatous disease. 02/20/2024 MRI abdomen/pelvis: Infiltrative enhancing left renal mass measuring up to 12.8 cm with involvement of the renal sinus, left renal vein, and juxtarenal IVC, indeterminate enhancing lesion at L4 02/20/2024 CT abdomen/pelvis: Unchanged left kidney mass with marked left renal vein tumor thrombus extending to the infrahepatic IVC, unchanged tumor thrombus extending into the left adrenal vein and possibly the left gonadal vein, unchanged prominent left pararenal lymph nodes suspicious for metastatic disease, indeterminate L4 lesion 02/20/2024 CT chest: Negative for metastatic disease, multinodular thyroid  with a dominant 2.1 cm right thyroid  nodule 03/03/2024 left radical nephrectomy/adrenalectomy, tumor thrombus removed from the IVC 03/05/2024 CT head: 1.2 cm ill-defined lytic lesion at the left parietal skull-indeterminate 03/10/2024  CT abdomen/pelvis: Stable to decrease size of left nephrectomy bed hematoma, 2.2 cm segment 6 liver lesion, stable from 03/08/2024, new compared to 02/20/2024 Chromophobe renal cell carcinoma, 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 Kassie, maintained on infliximab  Hypertension Hypercholesterolemia 03/09/2024 admission with orthostatic hypotension, transfused 1 unit of packed red blood cells prior to hospital admission and 2 units in the hospital postoperative hematoma    Disposition: Tamara Myers has been diagnosed with a chromophobe carcinoma of the left kidney.  She underwent a radical nephrectomy.  The tumor involve the renal vein and IVC with a positive renal vein margin.  There is radiologic evidence of metastatic disease involving the bones and liver.  She is scheduled for additional imaging and follow-up at [facility] next month.  She plans a vacation Late December/early January.  I will see her during the second week of January.  I am available to see her sooner as needed.  I will coordinate a treatment plan with the [facility] team.  Arley Hof, MD  03/25/2024  11:44 AM

## 2024-03-29 ENCOUNTER — Ambulatory Visit (HOSPITAL_BASED_OUTPATIENT_CLINIC_OR_DEPARTMENT_OTHER)
Admission: RE | Admit: 2024-03-29 | Discharge: 2024-03-29 | Disposition: A | Discharge status: Home / Self Care | Source: Ambulatory Visit | Attending: Pulmonary Disease | Admitting: Pulmonary Disease

## 2024-03-29 DIAGNOSIS — C642 Malignant neoplasm of left kidney, except renal pelvis: Secondary | ICD-10-CM | POA: Diagnosis not present

## 2024-03-29 DIAGNOSIS — J479 Bronchiectasis, uncomplicated: Secondary | ICD-10-CM | POA: Diagnosis not present

## 2024-03-29 DIAGNOSIS — R911 Solitary pulmonary nodule: Secondary | ICD-10-CM | POA: Diagnosis not present

## 2024-03-29 DIAGNOSIS — D869 Sarcoidosis, unspecified: Secondary | ICD-10-CM | POA: Diagnosis not present

## 2024-03-29 DIAGNOSIS — I3139 Other pericardial effusion (noninflammatory): Secondary | ICD-10-CM | POA: Diagnosis not present

## 2024-04-01 ENCOUNTER — Encounter (HOSPITAL_BASED_OUTPATIENT_CLINIC_OR_DEPARTMENT_OTHER): Payer: Self-pay | Admitting: Pulmonary Disease

## 2024-04-01 ENCOUNTER — Other Ambulatory Visit: Payer: Self-pay

## 2024-04-01 ENCOUNTER — Ambulatory Visit (HOSPITAL_BASED_OUTPATIENT_CLINIC_OR_DEPARTMENT_OTHER): Admitting: Pulmonary Disease

## 2024-04-01 VITALS — BP 124/86 | HR 90 | Temp 98.2°F | Ht 63.0 in | Wt 107.5 lb

## 2024-04-01 DIAGNOSIS — R945 Abnormal results of liver function studies: Secondary | ICD-10-CM

## 2024-04-01 DIAGNOSIS — Z9225 Personal history of immunosupression therapy: Secondary | ICD-10-CM | POA: Diagnosis not present

## 2024-04-01 DIAGNOSIS — D860 Sarcoidosis of lung: Secondary | ICD-10-CM | POA: Diagnosis not present

## 2024-04-01 DIAGNOSIS — D869 Sarcoidosis, unspecified: Secondary | ICD-10-CM

## 2024-04-01 DIAGNOSIS — J45909 Unspecified asthma, uncomplicated: Secondary | ICD-10-CM

## 2024-04-01 LAB — CBC WITH DIFFERENTIAL/PLATELET
Basophils Absolute: 0 x10E3/uL (ref 0.0–0.2)
Basos: 1 %
EOS (ABSOLUTE): 0.2 x10E3/uL (ref 0.0–0.4)
Eos: 5 %
Hematocrit: 37.9 % (ref 34.0–46.6)
Hemoglobin: 12.1 g/dL (ref 11.1–15.9)
Immature Grans (Abs): 0 x10E3/uL (ref 0.0–0.1)
Immature Granulocytes: 0 %
Lymphocytes Absolute: 0.7 x10E3/uL (ref 0.7–3.1)
Lymphs: 19 %
MCH: 29.3 pg (ref 26.6–33.0)
MCHC: 31.9 g/dL (ref 31.5–35.7)
MCV: 92 fL (ref 79–97)
Monocytes Absolute: 0.5 x10E3/uL (ref 0.1–0.9)
Monocytes: 13 %
Neutrophils Absolute: 2.3 x10E3/uL (ref 1.4–7.0)
Neutrophils: 62 %
Platelets: 307 x10E3/uL (ref 150–450)
RBC: 4.13 x10E6/uL (ref 3.77–5.28)
RDW: 13.8 % (ref 11.7–15.4)
WBC: 3.7 x10E3/uL (ref 3.4–10.8)

## 2024-04-01 LAB — COMPREHENSIVE METABOLIC PANEL WITH GFR
ALT: 19 IU/L (ref 0–32)
AST: 19 IU/L (ref 0–40)
Albumin: 4.4 g/dL (ref 3.9–4.9)
Alkaline Phosphatase: 46 IU/L — ABNORMAL LOW (ref 49–135)
BUN/Creatinine Ratio: 20 (ref 12–28)
BUN: 20 mg/dL (ref 8–27)
Bilirubin Total: 0.3 mg/dL (ref 0.0–1.2)
CO2: 24 mmol/L (ref 20–29)
Calcium: 9.9 mg/dL (ref 8.7–10.3)
Chloride: 102 mmol/L (ref 96–106)
Creatinine, Ser: 1.01 mg/dL — ABNORMAL HIGH (ref 0.57–1.00)
Globulin, Total: 2.2 g/dL (ref 1.5–4.5)
Glucose: 80 mg/dL (ref 70–99)
Potassium: 4.5 mmol/L (ref 3.5–5.2)
Sodium: 140 mmol/L (ref 134–144)
Total Protein: 6.6 g/dL (ref 6.0–8.5)
eGFR: 61 mL/min/1.73 (ref >59)

## 2024-04-01 NOTE — Progress Notes (Signed)
 Received CC chart from Dr. Kassie, Cancel infliximab . Patient under oncology care now.   Discontinued therapy plan in infusion navigator for infliximab .

## 2024-04-01 NOTE — Progress Notes (Unsigned)
 Subjective:   PATIENT ID: Tamara Myers GENDER: female DOB: 18-Oct-1955, MRN: [id number]   HPI  Chief Complaint  Patient presents with   Sarcoidosis   Reason for Visit: Follow-up sarcoid  [name] is a 68 year old female with sarcoidosis, asthma, osteoporosis, GERD, HLD, HTN who presents for follow-up  Synopsis: She initially was seen by Dr. Brenna in 2020 with symptoms of progressive cough and CT demonstrating perilymphatic micro-nodularity and bilateral hilar adenopathy including 1.4 cm hilar lymph node and 1cm RLL nodule consistent with sarcoid. Bronchoscopy on 11/20/18 with granulomatous inflammation. Started on prednisone  in August 2020 with clinical improvement and improvement of parenchymal changes and nodularity. Started on methotrexate  10 mg and steroids November 2020. Continue steroids until 01/2020 and maintained only methotrexate  with stable respiratory symptoms. Attempted methotrexate  taper in fall 2022 however symptoms recurred. Has been controlled on methotrexate  on low dosage 5-7.5. Had to discontinue in March 2023 due to leukopenia. Had to be restarted on prednisone . Started on azathioprine  May 2023 and currently on reduced dosing due to leukopenia.  04/12/22 Since our last visit she reports improved symptoms however did develop a cough in the last three weeks. This may have been preceded by cold but not sure if it is related. Using albuterol  once a week and advair  as directed. Cough is sometimes productive. No wheezing. Compliant with azathioprine . Denies side effects (nausea, rash, hair loss, discoloration of urine or tears).  06/01/22 She has continued to have productive cough that has now progressed to green sputum to whitish sputum. Increased frequency. Denies fevers/chills. Compliant with azathioprine . Denies side effects (nausea, rash, hair loss, discoloration of urine or tears)  06/19/22 Since our last visit she completed her antibiotic. Cough has improved.  Denies shortness of breath or wheezing. She has been compliant with low-dose azathioprine  but noticed her labs demonstrated leukopenia again.  09/20/22 Since our last visit she was started on Infliximab  07/12/22. Tolerating well. Cough has improved and no longer nocturnal cough. Compliant with Advair . Denies shortness of breath or wheezing.  04/02/23 Since our last visit she has been overall well. She did pick up a cold last week. No fevers, chills. But symptoms persist with cough.  08/06/23 Since our last she is doing well. Stopped taking Advair  HFA for the last 3 days to prepare for PFTs with no symptom changes. Denies coughing, wheezing or shortness of breath.   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 Her last infliximab  infusion was on 01/23/24. She reports from a respiratory standpoint she reports no recurrence of cough and shortness of breath. Compliant with Symbicort . She walks daily and 3000 steps.   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 passed from pulmonary fibrosis  Past Medical History:  Diagnosis Date   Cancer (HCC)    basal cell carcinoma   Cataract    bilateral   Complication of anesthesia    slow to wake up   Dyspnea    on exertion   GERD (gastroesophageal reflux disease)    on meds   Hyperlipidemia    on meds   Hypertension    on meds   Melanoma (HCC) 2018   left upper arm   Moderate persistent asthma without complication 04/04/2018   on meds   Osteoporosis    confirmed by bone scan   PONV (postoperative nausea and vomiting)    Post menopausal syndrome  Pulmonary sarcoidosis    tx'd     Family History  Problem Relation Age of Onset   Osteoporosis Mother    Diabetes Father    Heart disease Father    Hyperlipidemia Father    Hypertension Father    Pulmonary fibrosis Father    Asthma Daughter    Breast cancer Neg Hx    Allergic  rhinitis Neg Hx    Eczema Neg Hx    Urticaria Neg Hx    Angioedema Neg Hx    Colon polyps Neg Hx    Colon cancer Neg Hx    Esophageal cancer Neg Hx    Rectal cancer Neg Hx    Stomach cancer Neg Hx      Social History   Occupational History   Not on file  Tobacco Use   Smoking status: Never   Smokeless tobacco: Never  Vaping Use   Vaping status: Never Used  Substance and Sexual Activity   Alcohol use: No   Drug use: No   Sexual activity: Not on file    No Known Allergies   Outpatient Medications Prior to Visit  Medication Sig Dispense Refill   albuterol  (VENTOLIN  HFA) 108 (90 Base) MCG/ACT inhaler Inhale 2 puffs into the lungs every 6 (six) hours as needed for wheezing or shortness of breath. 18 g 1   amLODipine  (NORVASC ) 5 MG tablet Take 1 tablet (5 mg total) by mouth daily. 90 tablet 1   budesonide -formoterol  (SYMBICORT ) 160-4.5 MCG/ACT inhaler Inhale 2 puffs twice daily 10.2 g 3   inFLIXimab -axxq (AVSOLA  IV) Inject into the vein. Infuse 5 mg/kg at weeks 0, 2, and 6 then maintenance of 5 mg/kg every 8 weeks thereafter (receives at [facility] center)     loperamide (IMODIUM) 2 MG capsule Take by mouth as needed.     ondansetron  (ZOFRAN ) 4 MG tablet Take 1 tablet (4 mg total) by mouth every 8 (eight) hours as needed for nausea or vomiting. 20 tablet 1   pantoprazole  (PROTONIX ) 40 MG tablet Take 1 tablet (40 mg total) by mouth daily. 90 tablet 3   potassium chloride  SA (KLOR-CON  M) 20 MEQ tablet Take 1 tablet (20 mEq total) by mouth daily. 90 tablet 3   rosuvastatin  (CRESTOR ) 10 MG tablet Take 1 tablet (10 mg total) by mouth daily. 90 tablet 3   triamterene -hydrochlorothiazide (MAXZIDE-25) 37.5-25 MG tablet Take 1 tablet by mouth daily. 90 tablet 1   calcium  carbonate (OSCAL) 1500 (600 Ca) MG TABS tablet Take by mouth 2 (two) times daily with a meal.     enoxaparin (LOVENOX) 30 MG/0.3ML injection Inject 30 mg into the skin daily. (Patient not taking: Reported on  04/01/2024)     No facility-administered medications prior to visit.    Review of Systems  Constitutional:  Negative for chills, diaphoresis, fever, malaise/fatigue and weight loss.  HENT:  Negative for congestion.   Respiratory:  Negative for cough, hemoptysis, sputum production, shortness of breath and wheezing.   Cardiovascular:  Negative for chest pain, palpitations and leg swelling.     Objective:   Vitals:   04/01/24 0940  BP: 124/86  Pulse: 90  Temp: 98.2 F (36.8 C)  SpO2: 99%  Weight: 107 lb 8 oz (48.8 kg)  Height: 5' 3 (1.6 m)     SpO2: 99 %  Body mass index is 19.04 kg/m.  Physical Exam: General: Well-appearing, no acute distress HENT: [place], AT Eyes: EOMI, no scleral icterus Respiratory: ***Clear to auscultation bilaterally.  No crackles, wheezing or rales Cardiovascular: RRR, -M/R/G, no JVD Extremities:-Edema,-tenderness Neuro: AAO x4, CNII-XII grossly intact Psych: Normal mood, normal affect  Data Reviewed:  Imaging:  CT Chest 07/02/18 - Bilateral nodularity with perilymphatic distribution including 1cm RLL nodule. Right hilar enlargement.  CT Chest 10/18/18 - Progressive bilateral nodularity including RLL nodule ~74mm  CT Chest 03/10/19 - Improved parenchymal changes with mild disease in RML, lingular and superior LLL  CT Chest HR 06/30/19 - Improved parenchymal changes in setting of immunosuppression. RLL 5 mm subpleural nodule.  CT Chest HR 07/15/20 - No significant parenchymal abnormalities or consolidations seen. Stable calcified mediastinal and bilateral hilar nodes. New 2mm solid RUL nodule likely benign. Stable peripheral RUL GGO stable and RLL 5mm nodule stable since 06/2019.  CT Chest 06/10/22 - Progressive pulmonary sarcoid with several new scattered irregular nodules including 10 mm LLL nodule and new GGO in RLL and RML, new subpleural consolidation in superior LLL  CT Chest 03/15/23 - New peribronchovascular GGO in RUL with new 7mm noncalcified  nodule and new ground glass 5 mm nodule. Resolved left lower lobe nodules. Stable small subpleural consolidation in apical LLL. Calcified mediastinal and bilateral hilar lymph nodes  CT Chest 03/29/24 - Stable RULE bronchiectasis.  PFT: 01/23/18 FVC 2.53 (77%) FEV1 2.19 (86%) Ratio 87% Interpretation: No obstructive lung disease. Reduced FVC and FEV1  04/04/18 FVC 2.59 (78%) FEV1 1.93 (76%) Ratio 75% Interpretation: No obstructive lung disease. Reduced FVC and FEV1.  05/09/18 FVC 2.43 (73%) FEV1 1.6 (63%) Ratio 66% Interpretation: Moderate obstructive lung disease  11/06/18 FVC 2.92 (89%) FEV1 2.04 (74%) Ratio 74  (Pred 78) TLC 93% DLCO 100%. F-V loops suggestive of small airway disease Interpretation: Mild obstructive defect. No significant BD  03/17/20 FVC 2.53 (79%) FEV1 1.96 (80%) Ratio 65  TLC 87% DLCO 101%. Significant BD response in FEV1 Interpretation: Mild obstructive defect with significant bronchodilator response  08/06/23 FVC 2.60 (85%) FEV1 2.05 (88%) Ratio 79  TLC 89% DLCO 92% Interpretation: Normal PFTs      Latest Ref Rng & Units 03/08/2024    6:10 PM 01/18/2024    8:58 AM 11/23/2023    9:07 AM  CBC  WBC 4.0 - 10.5 K/uL 6.9  2.7  2.9   Hemoglobin 12.0 - 15.0 g/dL 8.0  87.1  87.6   Hematocrit 36.0 - 46.0 % 24.6  39.3  38.0   Platelets 150 - 400 K/uL 281  232  178   Normal WBC off infliximab  Hg 8, recent hematoma      Latest Ref Rng & Units 03/08/2024    6:10 PM 01/18/2024    8:58 AM 11/23/2023    9:07 AM  BMP  Glucose 70 - 99 mg/dL 895  86  84   BUN 8 - 23 mg/dL 19  13  15    Creatinine 0.44 - 1.00 mg/dL 9.12  9.07  9.06   BUN/Creat Ratio 6 - 22 (calc)  SEE NOTE:  SEE NOTE:   Sodium 135 - 145 mmol/L 134  144  139   Potassium 3.5 - 5.1 mmol/L 4.4  4.2  3.7   Chloride 98 - 111 mmol/L 101  105  101   CO2 22 - 32 mmol/L 25  31  30    Calcium  8.9 - 10.3 mg/dL 8.6  9.7  9.5   Overall normal electrolytes     Latest Ref Rng & Units 03/08/2024    6:10 PM 01/18/2024     8:58 AM 11/23/2023  9:07 AM  Hepatic Function  Total Protein 6.5 - 8.1 g/dL 4.9  6.6  6.3   Albumin 3.5 - 5.0 g/dL 3.2     AST 15 - 41 U/L 96  30  43   ALT 0 - 44 U/L 133  16  16   Alk Phosphatase 38 - 126 U/L 52     Total Bilirubin 0.0 - 1.2 mg/dL 0.4  0.5  0.5   Elevated AST/ALT and AP   Assessment & Plan:   Discussion: 68 year old female with sarcoidosis, asthma, osteoporosis, GERD, HLD, HTN who presents for follow-up sarcoid flare. Has had difficulty tolerating immunosuppressants due to leukopenia on methotrexate  and azathioprine . T imaging from February 2024 with progressive pulmonary sarcoid. Currently on infliximab  since 07/12/22. CT 03/2023 with peribronchosvascular ground glasse in RUL with new 7 mm noncalcified nodule and new ground glass 5 mm nodule.  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  --Previously CT 03/2023. Resolved nodular changes in LLL but new GGO/nodules in RUL but very minimal  >Reviewed CT. Overall, improved RUL areas with minimal mucoid impactions and scattered bronchiectasis. Will message patient on final read  >Planning for repeat CT chest pending oncology work-up/management  History of immunosuppression High risk medication management --QuantiFERON-TB 03/12/19: neg  --Prednisone  11/2018>01/2020 --Methotrexate  03/2019>06/2021. D/C'd due to leukopenia --Prednisone  06/2021>08/2021 --Azathioprine  09/09/21>06/19/22. D/C'd due to leukopenia --Started infliximab  07/12/22-01/23/24 --Off infliximab  --Reviewed labs. Repeat CBC with diff and CMET  Sarcoid Monitoring --Recent chest imaging reviewed.  --Annual PFTs.  Last PFTs 08/06/23 --Annual ophthalmology exam.  Last visit on 06/06/21. No active disease. Scheduled for 06/2022 --MR Cardiac 11/28/19. No active disease --Monitor routine labs as needed: CBC with diff, CMET, 1, 25 and 25 hydroxy vitamin D ,  urinary calcium   Asthma  --PFTs normal with infliximab . Consider repeating PFTs in 1 year off immunosuppressants (03/2025) --CONTINUE generic Symbicort  160-4.5 mcg TWO puffs TWICE a day --CONTINUE Albuterol  TWO puffs AS NEEDED for shortness of breath, chest tightness, cough or wheezing  Elevated LFTs --Repeat labs today  Health Maintenance Immunization History  Administered Date(s) Administered   Fluad Quad(high Dose 65+) 12/30/2021   Influenza Inj Mdck Quad Pf 02/13/2014, 01/21/2021   Influenza Inj Mdck Quad With Preservative 02/13/2014   Influenza Split 02/13/2013   Influenza, Seasonal, Injecte, Preservative Fre 02/13/2013   Influenza,inj,Quad PF,6+ Mos 02/12/2014, 12/21/2016, 12/25/2017, 01/02/2019, 01/08/2020, 01/17/2022   Influenza,inj,quad, With Preservative 01/14/2024   Influenza,trivalent, recombinat, inj, PF 02/09/2011, 02/15/2012   Influenza-Unspecified 02/13/2013, 01/22/2023   Moderna Covid-19 Fall Seasonal Vaccine 47yrs & older 02/21/2022   PFIZER(Purple Top)SARS-COV-2 Vaccination 07/03/2019, 08/03/2019, 02/26/2020, 01/21/2021, 12/29/2022   PNEUMOCOCCAL CONJUGATE-20 01/10/2021, 01/26/2022   Pfizer(Comirnaty)Fall Seasonal Vaccine 12 years and older 12/29/2022   Respiratory Syncytial Virus Vaccine,Recomb Aduvanted(Arexvy) 01/26/2022   Tdap 03/16/2009, 01/15/2020   Zoster Recombinant(Shingrix) 11/22/2021, 02/08/2022    No orders of the defined types were placed in this encounter.  No orders of the defined types were placed in this encounter.   No follow-ups on file.   I have spent a total time of***-minutes on the day of the appointment including chart review, data review, collecting history, coordinating care and discussing medical diagnosis and plan with the patient/family. Past medical history, allergies, medications were reviewed. Pertinent imaging, labs and tests included in this note have been reviewed and interpreted independently by me.  Ariyon Mittleman Slater Staff,  MD [facility] 04/01/2024 9:55 AM

## 2024-04-01 NOTE — Patient Instructions (Addendum)
--  Previously CT 03/2023. Resolved nodular changes in LLL but new GGO/nodules in RUL but very minimal  >Reviewed CT. Overall, improved RUL areas with minimal mucoid impactions and scattered bronchiectasis. Will message patient on final read  >Planning for repeat CT chest pending oncology work-up/management  --Reviewed labs. Repeat CBC with diff and CMET

## 2024-04-02 ENCOUNTER — Ambulatory Visit (HOSPITAL_BASED_OUTPATIENT_CLINIC_OR_DEPARTMENT_OTHER): Payer: Self-pay | Admitting: Pulmonary Disease

## 2024-04-17 DIAGNOSIS — M898X8 Other specified disorders of bone, other site: Secondary | ICD-10-CM | POA: Diagnosis not present

## 2024-04-22 ENCOUNTER — Telehealth: Payer: Self-pay | Admitting: Nurse Practitioner

## 2024-04-22 NOTE — Telephone Encounter (Signed)
 PT's husband called to cancel appt; reason unspecified.

## 2024-05-12 ENCOUNTER — Inpatient Hospital Stay: Admitting: Nurse Practitioner

## 2024-05-14 ENCOUNTER — Ambulatory Visit: Payer: Medicare Other | Admitting: Internal Medicine

## 2024-05-14 ENCOUNTER — Other Ambulatory Visit

## 2024-05-14 VITALS — BP 118/78 | HR 82 | Ht 63.0 in | Wt 109.0 lb

## 2024-05-14 DIAGNOSIS — M8580 Other specified disorders of bone density and structure, unspecified site: Secondary | ICD-10-CM | POA: Diagnosis not present

## 2024-05-14 NOTE — Progress Notes (Signed)
 "  Name: Tamara Myers  MRN/ DOB: [id number], 27-Mar-1956    Age/ Sex: 69 y.o., female     PCP: Perri Ronal PARAS, MD   Reason for Endocrinology Evaluation: Osteoporosis     Initial Endocrinology Clinic Visit: 05/03/2020    PATIENT IDENTIFIER: Tamara Myers is a 69 y.o., female with a past medical history of HTN, GERD, osteoporosis, and asthma. She has followed with [facility] since 05/03/2020 for consultative assistance with management of her osteoporosis.   HISTORICAL SUMMARY: The patient was first diagnosed with osteoporosis in 2021 , with a T-score of -2.7 at the AP spine  Secondary cause: prednisone  2020-2021, for sarcoidosis, and Vit-D def.  Fractures:  T-spine in 2021 (nontraumatic), and several fingers as a child right thumb (2015-with injuries).   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 Kassie from 05/2020 until 07/2021 SUBJECTIVE:    Today (05/14/2024):  Tamara Myers is here for a follow-up on osteoporosis.   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  She received her third zoledronic  acid infusion 06/2022  She is on vitamin D3  daily Calcium  600 mg BID      HISTORY:  Past Medical History:  Past Medical History:  Diagnosis Date   Cancer (HCC)    basal cell carcinoma   Cataract    bilateral   Complication of anesthesia    slow to wake up   Dyspnea    on exertion   GERD (gastroesophageal reflux disease)    on meds   Hyperlipidemia    on meds   Hypertension     on meds   Melanoma (HCC) 2018   left upper arm   Moderate persistent asthma without complication 04/04/2018   on meds   Osteoporosis    confirmed by bone scan   PONV (postoperative nausea and vomiting)    Post menopausal syndrome    Pulmonary sarcoidosis    tx'd   Past Surgical History:  Past Surgical History:  Procedure Laterality Date   BREAST CYST ASPIRATION Right    CATARACT EXTRACTION, BILATERAL Bilateral 10/2020   CESAREAN SECTION     OPEN REDUCTION INTERNAL FIXATION (ORIF) METACARPAL Right 11/24/2014   Procedure: OPEN REDUCTION INTERNAL FIXATION (ORIF) RIGHT THUMB;  Surgeon: Prentice Pagan, MD;  Location: [facility] OR;  Service: Orthopedics;  Laterality: Right;   skin shave biopsy  02/08/2021   left mid lateral posterior arm   thumb surgery Right 2014   VIDEO BRONCHOSCOPY N/A 11/20/2018   Procedure: Video Bronchoscopy With Fluoro;  Surgeon: Brenna Adine CROME, DO;  Location: [facility] OR;  Service: Thoracic;  Laterality: N/A;   VIDEO BRONCHOSCOPY WITH ENDOBRONCHIAL ULTRASOUND N/A 11/20/2018   Procedure: VIDEO BRONCHOSCOPY WITH ENDOBRONCHIAL ULTRASOUND;  Surgeon: Brenna Adine CROME, DO;  Location: [facility] OR;  Service: Thoracic;  Laterality: N/A;   Social History:  reports that she has never smoked. She has never used smokeless tobacco. She reports that she does not drink alcohol and does not use drugs. Family History:  Family History  Problem Relation Age of  Onset   Osteoporosis Mother    Diabetes Father    Heart disease Father    Hyperlipidemia Father    Hypertension Father    Pulmonary fibrosis Father    Asthma Daughter    Breast cancer Neg Hx    Allergic rhinitis Neg Hx    Eczema Neg Hx    Urticaria Neg Hx    Angioedema Neg Hx    Colon polyps Neg Hx    Colon cancer Neg Hx    Esophageal cancer Neg Hx    Rectal cancer Neg Hx    Stomach cancer Neg Hx      HOME MEDICATIONS: Allergies as of 05/14/2024   No Known Allergies      Medication List        Accurate as of May 14, 2024 11:36 AM. If you have any questions, ask your nurse or doctor.          albuterol  108 (90 Base) MCG/ACT inhaler Commonly known as: VENTOLIN  HFA Inhale 2 puffs into the lungs every 6 (six) hours as needed for wheezing or shortness of breath.   amLODipine  5 MG tablet Commonly known as: NORVASC  Take 1 tablet (5 mg total) by mouth daily.   AVSOLA  IV Inject into the vein. Infuse 5 mg/kg at weeks 0, 2, and 6 then maintenance of 5 mg/kg every 8 weeks thereafter (receives at [facility] infusion center)   budesonide -formoterol  160-4.5 MCG/ACT inhaler Commonly known as: SYMBICORT  Inhale 2 puffs twice daily   calcium  carbonate 1500 (600 Ca) MG Tabs tablet Commonly known as: OSCAL Take by mouth 2 (two) times daily with a meal.   loperamide 2 MG capsule Commonly known as: IMODIUM Take by mouth as needed.   ondansetron  4 MG tablet Commonly known as: Zofran  Take 1 tablet (4 mg total) by mouth every 8 (eight) hours as needed for nausea or vomiting.   pantoprazole  40 MG tablet Commonly known as: PROTONIX  Take 1 tablet (40 mg total) by mouth daily.   potassium chloride  SA 20 MEQ tablet Commonly known as: KLOR-CON  M Take 1 tablet (20 mEq total) by mouth daily.   rosuvastatin  10 MG tablet Commonly known as: CRESTOR  Take 1 tablet (10 mg total) by mouth daily.   triamterene -hydrochlorothiazide 37.5-25 MG tablet Commonly known as: MAXZIDE-25 Take 1 tablet by mouth daily.          OBJECTIVE:   PHYSICAL EXAM: VS: BP 118/78   Pulse 82   Ht 5' 3 (1.6 m)   Wt 109 lb (49.4 kg)   SpO2 98%   BMI 19.31 kg/m    EXAM: General: Pt appears well and is in NAD  Neck: General: Supple without adenopathy. Thyroid : No goiter or nodules appreciated  Lungs: Clear with good BS bilat   Heart: Auscultation: RRR.  Extremities:  BL LE: No pretibial edema  Mental Status: Judgment, insight: Intact Orientation: Oriented to time, place, and person Mood and affect: No depression,  anxiety, or agitation     DATA REVIEWED:   Latest Reference Range & Units 04/01/24 10:35  Comprehensive metabolic panel with GFR  Rpt !  Sodium 134 - 144 mmol/L 140  Potassium 3.5 - 5.2 mmol/L 4.5  Chloride 96 - 106 mmol/L 102  CO2 20 - 29 mmol/L 24  Glucose 70 - 99 mg/dL 80  BUN 8 - 27 mg/dL 20  Creatinine 9.42 - 8.99 mg/dL 8.98 (H)  Calcium  8.7 - 10.3 mg/dL 9.9  BUN/Creatinine Ratio 12 - 28  20  eGFR >59 mL/min/1.73 61  Alkaline Phosphatase 49 - 135 IU/L 46 (L)  Albumin 3.9 - 4.9 g/dL 4.4  AST 0 - 40 IU/L 19  ALT 0 - 32 IU/L 19  Total Protein 6.0 - 8.5 g/dL 6.6  Total Bilirubin 0.0 - 1.2 mg/dL 0.3    DXA 3/74/7974 @[facility]    Results:  Lumbar spine L1-L4 (L3) Femoral neck (FN)  T-score -2.1 RFN: -1.8 LFN: -1.6   ASSESSMENT / PLAN / RECOMMENDATIONS:   Osteoporosis   - She has completed 3 doses of zoledronic  acid between 2022 and 2024 -Repeat DXA in 2023 showed improvement of BMD at the spine with a T-score -2.7 to -1.9, with stability at the hips. -Repeat DXA scan continues to show osteopenia, unable to compare to prior results due to change in location as the breast center stopped doing bone densities in 2025 - Emphasized the importance of optimizing calcium , vitamin D , and weightbearing exercises when able   Medications  Continue calcium  600 mg BID  2. Low PTH:  - This is new, I suspect may be due to elevated vitamin D .  Will discontinue vitamin D  and continue to monitor  F/U in 1 yr    Signed electronically by: Stefano Redgie Butts, MD  [facility]  [facility] [address] [place], [place] [address] Phone: [phone] FAX: [phone]      CC: Perri Ronal PARAS, MD [address] [place] [address] Phone: [phone]  Fax: [phone]   Return to Endocrinology clinic as below: Future Appointments  Date Time Provider Department Center  01/22/2025  9:00 AM [facility]  [address]  01/26/2025 10:00 AM Baxley, Ronal PARAS, MD [facility] [address]     "

## 2024-05-15 ENCOUNTER — Ambulatory Visit: Payer: Self-pay | Admitting: Internal Medicine

## 2024-05-15 LAB — VITAMIN D 25 HYDROXY (VIT D DEFICIENCY, FRACTURES): Vit D, 25-Hydroxy: 96 ng/mL (ref 30–100)

## 2024-05-15 LAB — PARATHYROID HORMONE, INTACT (NO CA): PTH: <6 pg/mL — ABNORMAL LOW (ref 16–77)

## 2024-06-03 ENCOUNTER — Encounter (HOSPITAL_BASED_OUTPATIENT_CLINIC_OR_DEPARTMENT_OTHER): Payer: Self-pay | Admitting: Pulmonary Disease

## 2024-06-05 ENCOUNTER — Other Ambulatory Visit (HOSPITAL_BASED_OUTPATIENT_CLINIC_OR_DEPARTMENT_OTHER): Payer: Self-pay | Admitting: Pulmonary Disease

## 2024-09-01 ENCOUNTER — Ambulatory Visit (HOSPITAL_BASED_OUTPATIENT_CLINIC_OR_DEPARTMENT_OTHER): Admitting: Pulmonary Disease

## 2025-01-22 ENCOUNTER — Other Ambulatory Visit: Payer: Self-pay

## 2025-01-26 ENCOUNTER — Ambulatory Visit: Payer: Self-pay | Admitting: Internal Medicine

## 2025-05-11 ENCOUNTER — Ambulatory Visit: Admitting: Internal Medicine
